# Patient Record
Sex: Female | Born: 1950
Health system: Southern US, Community
[De-identification: ages and names within clinical notes are randomized; demographics above are authoritative.]

## PROBLEM LIST (undated history)

## (undated) DIAGNOSIS — I10 Essential (primary) hypertension: Secondary | ICD-10-CM

## (undated) DIAGNOSIS — M1990 Unspecified osteoarthritis, unspecified site: Secondary | ICD-10-CM

## (undated) HISTORY — DX: Unspecified osteoarthritis, unspecified site: M19.90

## (undated) HISTORY — PX: CHOLECYSTECTOMY: SHX55

## (undated) HISTORY — DX: Essential (primary) hypertension: I10

---

## 1979-05-10 HISTORY — PX: MANDIBLE SURGERY: SHX707

## 1999-05-10 HISTORY — PX: SHOULDER ARTHROSCOPY: SHX128

## 2000-05-15 ENCOUNTER — Emergency Department (HOSPITAL_COMMUNITY): Admission: EM | Admit: 2000-05-15 | Discharge: 2000-05-15 | Payer: Self-pay | Admitting: Emergency Medicine

## 2001-08-15 ENCOUNTER — Encounter (HOSPITAL_BASED_OUTPATIENT_CLINIC_OR_DEPARTMENT_OTHER): Payer: Self-pay | Admitting: General Surgery

## 2001-08-15 ENCOUNTER — Ambulatory Visit (HOSPITAL_COMMUNITY): Admission: RE | Admit: 2001-08-15 | Discharge: 2001-08-15 | Payer: Self-pay | Admitting: General Surgery

## 2001-08-17 ENCOUNTER — Ambulatory Visit (HOSPITAL_COMMUNITY): Admission: RE | Admit: 2001-08-17 | Discharge: 2001-08-17 | Payer: Self-pay | Admitting: General Surgery

## 2001-08-28 ENCOUNTER — Ambulatory Visit (HOSPITAL_COMMUNITY): Admission: RE | Admit: 2001-08-28 | Discharge: 2001-08-28 | Payer: Self-pay | Admitting: General Surgery

## 2001-08-28 ENCOUNTER — Encounter (HOSPITAL_BASED_OUTPATIENT_CLINIC_OR_DEPARTMENT_OTHER): Payer: Self-pay | Admitting: General Surgery

## 2004-12-20 ENCOUNTER — Emergency Department (HOSPITAL_COMMUNITY): Admission: EM | Admit: 2004-12-20 | Discharge: 2004-12-20 | Payer: Self-pay | Admitting: Emergency Medicine

## 2007-09-12 ENCOUNTER — Emergency Department (HOSPITAL_COMMUNITY): Admission: EM | Admit: 2007-09-12 | Discharge: 2007-09-12 | Payer: Self-pay | Admitting: Family Medicine

## 2012-02-07 ENCOUNTER — Emergency Department (HOSPITAL_COMMUNITY)
Admission: EM | Admit: 2012-02-07 | Discharge: 2012-02-07 | Disposition: A | Discharge status: Home / Self Care | Payer: Managed Care, Other (non HMO) | Source: Home / Self Care | Attending: Emergency Medicine | Admitting: Emergency Medicine

## 2012-02-07 ENCOUNTER — Encounter (HOSPITAL_COMMUNITY): Payer: Self-pay | Admitting: Emergency Medicine

## 2012-02-07 DIAGNOSIS — R030 Elevated blood-pressure reading, without diagnosis of hypertension: Secondary | ICD-10-CM

## 2012-02-07 LAB — POCT URINALYSIS DIP (DEVICE)
Glucose, UA: NEGATIVE mg/dL
Ketones, ur: 40 mg/dL — AB
Leukocytes, UA: NEGATIVE
Nitrite: NEGATIVE
Protein, ur: NEGATIVE mg/dL
Specific Gravity, Urine: 1.025 (ref 1.005–1.030)
Urobilinogen, UA: 0.2 mg/dL (ref 0.0–1.0)
pH: 5.5 (ref 5.0–8.0)

## 2012-02-07 NOTE — ED Notes (Signed)
Pt here with concerns of poss diabetes after last week episode of tingling in fingers and polyuria,increased thirst,tired,and slight h/a.Denies hx diabetes or HTN. BP 185/98

## 2012-02-07 NOTE — ED Provider Notes (Signed)
History     CSN: [id number]  Arrival date & time 02/07/12  7829   None     Chief Complaint  Patient presents with  . Diabetes    (Consider location/radiation/quality/duration/timing/severity/associated sxs/prior treatment) The history is provided by the patient.  Pt reports she drank sugary drink one week ago and shortly thereafter she began to have urinary frequency that lasted for a couple of days.  States same occurs when she eat sweet food.  Has been avoiding those food and symptoms have improved.  Additionally complains of generalized fatigue, headache, blurred vision and intermittent paresthesias in left hand.  History reviewed. No pertinent past medical history.  History reviewed. No pertinent past surgical history.  No family history on file.  History  Substance Use Topics  . Smoking status: Former Games developer  . Smokeless tobacco: Not on file  . Alcohol Use: No    OB History    Grav Para Term Preterm Abortions TAB SAB Ect Mult Living                  Review of Systems  Constitutional: Negative.   Respiratory: Negative.   Cardiovascular: Negative.   Genitourinary: Positive for frequency.  Neurological: Positive for dizziness and headaches. Negative for tremors, seizures, syncope, facial asymmetry, speech difficulty, weakness and numbness.    Allergies  Review of patient's allergies indicates no known allergies.  Home Medications  No current outpatient prescriptions on file.  BP 185/98  Pulse 80  Temp 98.1 F (36.7 C) (Oral)  Resp 16  SpO2 95%  Physical Exam  Nursing note and vitals reviewed. Constitutional: She is oriented to person, place, and time. Vital signs are normal. She appears well-developed and well-nourished. She is active and cooperative.  HENT:  Head: Normocephalic.  Right Ear: Hearing, tympanic membrane, external ear and ear canal normal.  Left Ear: Hearing, tympanic membrane, external ear and ear canal normal.  Nose: Nose normal.    Mouth/Throat: Uvula is midline, oropharynx is clear and moist and mucous membranes are normal.  Eyes: Conjunctivae normal are normal. Pupils are equal, round, and reactive to light. No scleral icterus. Right eye exhibits abnormal extraocular motion.  Neck: Trachea normal, normal range of motion and full passive range of motion without pain. Neck supple. No spinous process tenderness and no muscular tenderness present.  Cardiovascular: Normal rate, regular rhythm, normal heart sounds, intact distal pulses and normal pulses.   No murmur heard. Pulmonary/Chest: Effort normal and breath sounds normal.  Abdominal: Soft. Normal appearance and bowel sounds are normal. There is no tenderness. There is no rebound and no CVA tenderness.  Musculoskeletal: Normal range of motion.  Lymphadenopathy:       Head (right side): No submental, no submandibular, no tonsillar, no preauricular, no posterior auricular and no occipital adenopathy present.       Head (left side): No submental, no submandibular, no tonsillar, no preauricular, no posterior auricular and no occipital adenopathy present.    She has no cervical adenopathy.  Neurological: She is alert and oriented to person, place, and time. She has normal strength and normal reflexes. No cranial nerve deficit or sensory deficit. Coordination and gait normal. GCS eye subscore is 4. GCS verbal subscore is 5. GCS motor subscore is 6.       MAE x 4, distally sensation intact  Skin: Skin is warm and dry.  Psychiatric: She has a normal mood and affect. Her speech is normal and behavior is normal. Judgment and thought content normal.  Cognition and memory are normal.    ED Course  Procedures (including critical care time)  Labs Reviewed  POCT URINALYSIS DIP (DEVICE) - Abnormal; Notable for the following:    Bilirubin Urine SMALL (*)     Ketones, ur 40 (*)     Hgb urine dipstick SMALL (*)     All other components within normal limits  LAB REPORT - SCANNED    No results found.   1. Elevated BP       MDM  Establish primary care provider, resources provided.  I am not able to diagnose and manage diabetes in this office setting.         Johnsie Kindred, NP 02/08/12 1623

## 2012-02-08 ENCOUNTER — Emergency Department (HOSPITAL_COMMUNITY)
Admission: EM | Admit: 2012-02-08 | Discharge: 2012-02-08 | Disposition: A | Discharge status: Home / Self Care | Payer: Managed Care, Other (non HMO) | Attending: Emergency Medicine | Admitting: Emergency Medicine

## 2012-02-08 ENCOUNTER — Encounter (HOSPITAL_COMMUNITY): Payer: Self-pay | Admitting: *Deleted

## 2012-02-08 DIAGNOSIS — I10 Essential (primary) hypertension: Secondary | ICD-10-CM

## 2012-02-08 DIAGNOSIS — Z87891 Personal history of nicotine dependence: Secondary | ICD-10-CM | POA: Insufficient documentation

## 2012-02-08 LAB — CBC WITH DIFFERENTIAL/PLATELET
Lymphocytes Relative: 30 % (ref 12–46)
Lymphs Abs: 1.7 10*3/uL (ref 0.7–4.0)
MCV: 84.6 fL (ref 78.0–100.0)
Neutro Abs: 3.3 10*3/uL (ref 1.7–7.7)
Neutrophils Relative %: 55 % (ref 43–77)
Platelets: 180 10*3/uL (ref 150–400)
RBC: 4.56 MIL/uL (ref 3.87–5.11)
WBC: 5.9 10*3/uL (ref 4.0–10.5)

## 2012-02-08 LAB — URINALYSIS, ROUTINE W REFLEX MICROSCOPIC
Glucose, UA: NEGATIVE mg/dL
Leukocytes, UA: NEGATIVE
Protein, ur: NEGATIVE mg/dL

## 2012-02-08 LAB — BASIC METABOLIC PANEL
CO2: 26 mEq/L (ref 19–32)
Calcium: 9.8 mg/dL (ref 8.4–10.5)
GFR calc Af Amer: >90 mL/min (ref >90)
GFR calc non Af Amer: >90 mL/min (ref >90)
Sodium: 138 mEq/L (ref 135–145)

## 2012-02-08 MED ORDER — HYDROCHLOROTHIAZIDE 25 MG PO TABS: 25.0000 mg | ORAL_TABLET | Freq: Every day | ORAL | Status: DC

## 2012-02-08 NOTE — ED Provider Notes (Cosign Needed)
History     CSN: [id number]  Arrival date & time 02/08/12  1758   First MD Initiated Contact with Patient 02/08/12 1951      Chief Complaint  Patient presents with  . Dizziness    (Consider location/radiation/quality/duration/timing/severity/associated sxs/prior treatment) HPI Comments: Pt is a 61 year old woman who had headache and blurred vision a month ago.  Then 2 weeks ago she drank a sugary drink and had urinated a lot.  She also had numbness in fingers of her left hand, which has subsided.  [facility], and was advised to come to the ED to be checked for hypertension.  She had to work today, and so came in after work.  There was also some thought that she could have diabetes.  Patient is a 61 y.o. female presenting with weakness. The history is provided by the patient and medical records. No language interpreter was used.  Weakness Primary symptoms do not include fever. The symptoms began more than 1 week ago. The symptoms are waxing and waning. The neurological symptoms are diffuse.  Additional symptoms include weakness. Associated medical issues comments: Family history of hypertension.Marland Kitchen    History reviewed. No pertinent past medical history.  History reviewed. No pertinent past surgical history.  No family history on file.  History  Substance Use Topics  . Smoking status: Former Games developer  . Smokeless tobacco: Not on file  . Alcohol Use: No    OB History    Grav Para Term Preterm Abortions TAB SAB Ect Mult Living                  Review of Systems  Constitutional: Negative for fever and chills.  HENT: Negative.   Eyes: Negative.   Respiratory: Negative.   Cardiovascular: Negative.   Gastrointestinal: Negative.   Genitourinary: Negative.   Musculoskeletal: Negative.   Neurological: Positive for weakness.  Psychiatric/Behavioral: Negative.     Allergies  Review of patient's allergies indicates no known allergies.  Home  Medications   Current Outpatient Rx  Name Route Sig Dispense Refill  . ASPIRIN 325 MG PO TABS Oral Take 325 mg by mouth once.    Marlin Canary HEADACHE PO Oral Take 1 tablet by mouth daily as needed. For headache    . IBUPROFEN 200 MG PO TABS Oral Take 400 mg by mouth every 6 (six) hours as needed. For pain    . NAPROXEN SODIUM 220 MG PO TABS Oral Take 220 mg by mouth 2 (two) times daily with a meal.      BP 159/79  Pulse 75  Temp 98.6 F (37 C) (Oral)  Resp 18  SpO2 100%  Physical Exam  Vitals reviewed. Constitutional: She is oriented to person, place, and time. She appears well-developed and well-nourished. No distress.  HENT:  Head: Normocephalic and atraumatic.  Right Ear: External ear normal.  Left Ear: External ear normal.  Mouth/Throat: Oropharynx is clear and moist.  Eyes: Conjunctivae normal and EOM are normal. Pupils are equal, round, and reactive to light.  Neck: Normal range of motion. Neck supple.  Cardiovascular: Normal rate, regular rhythm and normal heart sounds.   Pulmonary/Chest: Breath sounds normal.  Abdominal: Soft. Bowel sounds are normal.  Musculoskeletal: Normal range of motion. She exhibits no edema and no tenderness.  Neurological: She is alert and oriented to person, place, and time.       No sensory or motor deficit.  Skin: Skin is warm and dry.  Psychiatric: She has a normal mood and affect. Her behavior is normal.    ED Course  Procedures (including critical care time)   Labs Reviewed  URINALYSIS, ROUTINE W REFLEX MICROSCOPIC  BASIC METABOLIC PANEL  CBC WITH DIFFERENTIAL   8:19 PM Pt seen --> physical exam performed.  Lab workup was negative.  Plan to treat for hypertension with HCTZ 25 mg qd.  She will need to establish herself with a primary care doctor for followup.   1. Hypertension            Carleene Cooper III, MD 02/09/12 [phone]

## 2012-02-08 NOTE — ED Notes (Signed)
Pt was seen at urgent care yesterday and reports dizzy and blurriness with increased urination for about one month.  Numbness to left tip of fingers for one month.  Pt states dizzy spell yesterday, but states been off balance for over one month

## 2012-02-08 NOTE — ED Provider Notes (Deleted)
7:47 PM  Date: 02/08/2012  Rate: 77  Rhythm: normal sinus rhythm and sinus arrhythmia  QRS Axis: normal  Intervals: normal  ST/T Wave abnormalities: nonspecific T wave changes  Conduction Disutrbances:none  Narrative Interpretation: Abnormal EKG  Old EKG Reviewed: none available    Carleene Cooper III, MD 02/08/12 1948

## 2012-02-09 NOTE — ED Provider Notes (Signed)
Medical screening examination/treatment/procedure(s) were performed by non-physician practitioner and as supervising physician I was immediately available for consultation/collaboration.  Leslee Home, M.D.   Reuben Likes, MD 02/09/12 1058

## 2013-07-29 ENCOUNTER — Emergency Department (HOSPITAL_COMMUNITY)
Admission: EM | Admit: 2013-07-29 | Discharge: 2013-07-29 | Disposition: A | Discharge status: Home / Self Care | Payer: Managed Care, Other (non HMO) | Attending: Emergency Medicine | Admitting: Emergency Medicine

## 2013-07-29 ENCOUNTER — Emergency Department (HOSPITAL_COMMUNITY): Payer: Managed Care, Other (non HMO)

## 2013-07-29 ENCOUNTER — Encounter (HOSPITAL_COMMUNITY): Payer: Self-pay | Admitting: Emergency Medicine

## 2013-07-29 DIAGNOSIS — IMO0001 Reserved for inherently not codable concepts without codable children: Secondary | ICD-10-CM | POA: Insufficient documentation

## 2013-07-29 DIAGNOSIS — M25579 Pain in unspecified ankle and joints of unspecified foot: Secondary | ICD-10-CM | POA: Insufficient documentation

## 2013-07-29 DIAGNOSIS — Z79899 Other long term (current) drug therapy: Secondary | ICD-10-CM | POA: Insufficient documentation

## 2013-07-29 DIAGNOSIS — M79672 Pain in left foot: Secondary | ICD-10-CM

## 2013-07-29 DIAGNOSIS — I10 Essential (primary) hypertension: Secondary | ICD-10-CM | POA: Insufficient documentation

## 2013-07-29 DIAGNOSIS — M79671 Pain in right foot: Secondary | ICD-10-CM

## 2013-07-29 DIAGNOSIS — R42 Dizziness and giddiness: Secondary | ICD-10-CM | POA: Insufficient documentation

## 2013-07-29 DIAGNOSIS — M79609 Pain in unspecified limb: Secondary | ICD-10-CM

## 2013-07-29 DIAGNOSIS — Z87891 Personal history of nicotine dependence: Secondary | ICD-10-CM | POA: Insufficient documentation

## 2013-07-29 DIAGNOSIS — Z791 Long term (current) use of non-steroidal anti-inflammatories (NSAID): Secondary | ICD-10-CM | POA: Insufficient documentation

## 2013-07-29 LAB — CBC WITH DIFFERENTIAL/PLATELET
BASOS ABS: 0.1 10*3/uL (ref 0.0–0.1)
BASOS PCT: 1 % (ref 0–1)
EOS ABS: 0.3 10*3/uL (ref 0.0–0.7)
Eosinophils Relative: 5 % (ref 0–5)
HCT: 40.9 % (ref 36.0–46.0)
Hemoglobin: 12.8 g/dL (ref 12.0–15.0)
Lymphocytes Relative: 33 % (ref 12–46)
Lymphs Abs: 1.8 10*3/uL (ref 0.7–4.0)
MCH: 27.4 pg (ref 26.0–34.0)
MCHC: 31.3 g/dL (ref 30.0–36.0)
MCV: 87.4 fL (ref 78.0–100.0)
MONOS PCT: 9 % (ref 3–12)
Monocytes Absolute: 0.5 10*3/uL (ref 0.1–1.0)
NEUTROS ABS: 2.8 10*3/uL (ref 1.7–7.7)
NEUTROS PCT: 52 % (ref 43–77)
PLATELETS: 160 10*3/uL (ref 150–400)
RBC: 4.68 MIL/uL (ref 3.87–5.11)
RDW: 14.2 % (ref 11.5–15.5)
WBC: 5.3 10*3/uL (ref 4.0–10.5)

## 2013-07-29 LAB — COMPREHENSIVE METABOLIC PANEL
ALBUMIN: 3.8 g/dL (ref 3.5–5.2)
ALK PHOS: 70 U/L (ref 39–117)
ALT: 13 U/L (ref 0–35)
AST: 18 U/L (ref 0–37)
BUN: 10 mg/dL (ref 6–23)
CO2: 27 mEq/L (ref 19–32)
Calcium: 9.4 mg/dL (ref 8.4–10.5)
Chloride: 104 mEq/L (ref 96–112)
Creatinine, Ser: 0.6 mg/dL (ref 0.50–1.10)
GFR calc Af Amer: >90 mL/min (ref >90)
GFR calc non Af Amer: >90 mL/min (ref >90)
Glucose, Bld: 81 mg/dL (ref 70–99)
POTASSIUM: 3.7 meq/L (ref 3.7–5.3)
SODIUM: 143 meq/L (ref 137–147)
TOTAL PROTEIN: 7.9 g/dL (ref 6.0–8.3)
Total Bilirubin: 0.7 mg/dL (ref 0.3–1.2)

## 2013-07-29 LAB — TROPONIN I

## 2013-07-29 MED ORDER — IBUPROFEN 800 MG PO TABS: 800.0000 mg | ORAL_TABLET | Freq: Three times a day (TID) | ORAL | Status: DC

## 2013-07-29 MED ORDER — HYDROCHLOROTHIAZIDE 25 MG PO TABS: 25.0000 mg | ORAL_TABLET | Freq: Every day | ORAL | Status: DC

## 2013-07-29 MED ORDER — IBUPROFEN 800 MG PO TABS
800.0000 mg | ORAL_TABLET | Freq: Once | ORAL | Status: AC
  Administered 2013-07-29: 800 mg via ORAL
  Filled 2013-07-29: qty 1

## 2013-07-29 NOTE — ED Notes (Signed)
Patient transported to X-ray 

## 2013-07-29 NOTE — ED Notes (Signed)
sts after work she is just exhausted also reports htn.

## 2013-07-29 NOTE — Progress Notes (Signed)
VASCULAR LAB PRELIMINARY  PRELIMINARY  PRELIMINARY  PRELIMINARY  Bilateral lower extremity venous duplex  completed.    Preliminary report:  Bilateral:  No evidence of DVT, superficial thrombosis, or Baker's Cyst.    Margaret Staggs, RVT 07/29/2013, 6:44 PM

## 2013-07-29 NOTE — ED Provider Notes (Signed)
CSN: [id number]     Arrival date & time 07/29/13  1435 History   First MD Initiated Contact with Patient 07/29/13 1745     Chief Complaint  Patient presents with  . Foot Pain  . Leg Pain     (Consider location/radiation/quality/duration/timing/severity/associated sxs/prior Treatment) HPI Comments: Patient complains of pain in her bilateral ankles and feet that has been going on for the past 6 months it is worse after she gets off work. 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 She came in today because she has a day off. Sometimes the pain is so bad that she has trouble walking. Denies any focal weakness, numbness or tingling. Lightheadedness. No chest pain or shortness of breath. She is not taking anything for the pain except over-the-counter arthritis relief. She has a history of hypertension states he is not on medication for several years.   The history is provided by the patient.    History reviewed. No pertinent past medical history. History reviewed. No pertinent past surgical history. History reviewed. No pertinent family history. History  Substance Use Topics  . Smoking status: Former Games developer  . Smokeless tobacco: Not on file  . Alcohol Use: No   OB History   Grav Para Term Preterm Abortions TAB SAB Ect Mult Living                 Review of Systems  Constitutional: Negative for fever, activity change and appetite change.  Eyes: Negative for photophobia.  Respiratory: Negative for cough, chest tightness and shortness of breath.   Genitourinary: Negative for dysuria and hematuria.  Musculoskeletal: Positive for arthralgias and myalgias. Negative for back pain.  Neurological: Negative for dizziness, weakness, numbness and headaches.  A complete 10 system review of systems was obtained and all systems are negative except as noted in the HPI and PMH.      Allergies  Review of patient's allergies indicates no known allergies.  Home Medications   Current  Outpatient Rx  Name  Route  Sig  Dispense  Refill  . OVER THE COUNTER MEDICATION   Oral   Take 1 tablet by mouth daily as needed (arthritis relief).         . hydrochlorothiazide (HYDRODIURIL) 25 MG tablet   Oral   Take 1 tablet (25 mg total) by mouth daily.   30 tablet   0   . ibuprofen (ADVIL,MOTRIN) 800 MG tablet   Oral   Take 1 tablet (800 mg total) by mouth 3 (three) times daily.   21 tablet   0    BP 184/68  Pulse 63  Temp(Src) 98.6 F (37 C) (Oral)  Resp 22  Ht 5\' 4"  (1.626 m)  Wt 250 lb (113.399 kg)  BMI 42.89 kg/m2  SpO2 99% Physical Exam  Constitutional: She is oriented to person, place, and time. She appears well-developed and well-nourished. No distress.  HENT:  Head: Normocephalic and atraumatic.  Mouth/Throat: Oropharynx is clear and moist. No oropharyngeal exudate.  Eyes: Conjunctivae and EOM are normal. Pupils are equal, round, and reactive to light.  Neck: Normal range of motion. Neck supple.  Cardiovascular: Normal rate, regular rhythm and normal heart sounds.   No murmur heard. Pulmonary/Chest: Effort normal and breath sounds normal. No respiratory distress.  Abdominal: Soft. There is no tenderness. There is no rebound and no guarding.  Musculoskeletal: Normal range of motion. She exhibits tenderness. She exhibits no edema.  5/5 strength in bilateral lower extremities. Ankle plantar  and dorsiflexion intact. Great toe extension intact bilaterally. +2 DP and PT pulses. +2 patellar reflexes bilaterally. Normal gait.  No calf swelling or tenderness.  Neurological: She is alert and oriented to person, place, and time. No cranial nerve deficit. She exhibits normal muscle tone. Coordination normal.  CN 2-12 intact, no ataxia on finger to nose, no nystagmus, 5/5 strength throughout, no pronator drift, Romberg negative, normal gait.   Skin: Skin is warm.    ED Course  Procedures (including critical care time) Labs Review Labs Reviewed  CBC WITH  DIFFERENTIAL  COMPREHENSIVE METABOLIC PANEL  TROPONIN I   Imaging Review Dg Ankle Complete Left  07/29/2013   CLINICAL DATA:  Bilateral ankle pain, no known trauma  EXAM: LEFT ANKLE COMPLETE - 3+ VIEW  COMPARISON:  None.  FINDINGS: Three views of left ankle submitted. No acute fracture or subluxation. There is plantar spurring of calcaneus. Ankle mortise is preserved.  IMPRESSION: No acute fracture or subluxation.  Plantar spurring of calcaneus.   Electronically Signed   By: Natasha MeadLiviu  Pop M.D.   On: 07/29/2013 19:54   Dg Ankle Complete Right  07/29/2013   CLINICAL DATA:  Bilateral ankle pain, no known trauma  EXAM: RIGHT ANKLE - COMPLETE 3+ VIEW  COMPARISON:  None.  FINDINGS: Three views of right ankle submitted. No acute fracture or subluxation. Plantar spurring of calcaneus.  IMPRESSION: No acute fracture or subluxation.  Plantar spurring of calcaneus.   Electronically Signed   By: Natasha MeadLiviu  Pop M.D.   On: 07/29/2013 19:54   Ct Head Wo Contrast  07/29/2013   CLINICAL DATA:  Headache.  EXAM: CT HEAD WITHOUT CONTRAST  TECHNIQUE: Contiguous axial images were obtained from the base of the skull through the vertex without intravenous contrast.  COMPARISON:  None.  FINDINGS: No intracranial abnormalities are identified, including mass lesion or mass effect, hydrocephalus, extra-axial fluid collection, midline shift, hemorrhage, or acute infarction.  The visualized bony calvarium is unremarkable.  IMPRESSION: Unremarkable noncontrast head CT.   Electronically Signed   By: Laveda AbbeJeff  Hu M.D.   On: 07/29/2013 20:31   Dg Knee Complete 4 Views Left  07/29/2013   CLINICAL DATA:  Left knee pain, bilateral ankle pain  EXAM: LEFT KNEE - COMPLETE 4+ VIEW  COMPARISON:  None.  FINDINGS: Four views of the left knee submitted. No acute fracture or subluxation. Mild narrowing of medial joint compartment. Mild spurring of medial tibial plateau and medial femoral condyle. No joint effusion. Mild spurring of patella.  IMPRESSION: No  acute fracture or subluxation. Osteoarthritic changes as described above.   Electronically Signed   By: Natasha MeadLiviu  Pop M.D.   On: 07/29/2013 19:55     EKG Interpretation   Date/Time:  Monday July 29 2013 20:11:47 EDT Ventricular Rate:  66 PR Interval:  144 QRS Duration: 93 QT Interval:  427 QTC Calculation: 447 R Axis:   47 Text Interpretation:  Sinus rhythm No significant change was found  Confirmed by Manus GunningANCOUR  MD, Terran Klinke [phone]([id number]) on 07/29/2013 8:30:47 PM      MDM   Final diagnoses:  Hypertension  Foot pain, bilateral   Several months of bilateral foot pain that is worse after standing for prolonged periods of time. Focal weakness, numbness or tingling. Intact distal pulses.  Blood pressure elevated. History of noncompliance. No headache, chest pain or visual changes.  Dopplers negative for DVTs.   Blood Pressure improved to 176/75. X-rays negative for fractures. Will restart BP meds.  Patient needs PCP followup.  BP 184/68  Pulse 63  Temp(Src) 98.6 F (37 C) (Oral)  Resp 22  Ht 5\' 4"  (1.626 m)  Wt 250 lb (113.399 kg)  BMI 42.89 kg/m2  SpO2 99%   Glynn Octave, MD 07/29/13 2338

## 2013-07-29 NOTE — ED Notes (Signed)
Pt currently in xray

## 2013-07-29 NOTE — Discharge Instructions (Signed)
Arterial Hypertension Take anti-inflammatories as prescribed. Followup with a primary care doctor regarding your elevated blood pressure. Return to the ED if you develop new or worsening symptoms. Arterial hypertension (high blood pressure) is a condition of elevated pressure in your blood vessels. Hypertension over a long period of time is a risk factor for strokes, heart attacks, and heart failure. It is also the leading cause of kidney (renal) failure.  CAUSES   In Adults -- Over 90% of all hypertension has no known cause. This is called essential or primary hypertension. In the other 10% of people with hypertension, the increase in blood pressure is caused by another disorder. This is called secondary hypertension. Important causes of secondary hypertension are:  Heavy alcohol use.  Obstructive sleep apnea.  Hyperaldosterosim (Conn's syndrome).  Steroid use.  Chronic kidney failure.  Hyperparathyroidism.  Medications.  Renal artery stenosis.  Pheochromocytoma.  Cushing's disease.  Coarctation of the aorta.  Scleroderma renal crisis.  Licorice (in excessive amounts).  Drugs (cocaine, methamphetamine). Your caregiver can explain any items above that apply to you.  In Children -- Secondary hypertension is more common and should always be considered.  Pregnancy -- Few women of childbearing age have high blood pressure. However, up to 10% of them develop hypertension of pregnancy. Generally, this will not harm the woman. It may be a sign of 3 complications of pregnancy: preeclampsia, HELLP syndrome, and eclampsia. Follow up and control with medication is necessary. SYMPTOMS   This condition normally does not produce any noticeable symptoms. It is usually found during a routine exam.  Malignant hypertension is a late problem of high blood pressure. It may have the following symptoms:  Headaches.  Blurred vision.  End-organ damage (this means your kidneys, heart, lungs,  and other organs are being damaged).  Stressful situations can increase the blood pressure. If a person with normal blood pressure has their blood pressure go up while being seen by their caregiver, this is often termed "white coat hypertension." Its importance is not known. It may be related with eventually developing hypertension or complications of hypertension.  Hypertension is often confused with mental tension, stress, and anxiety. DIAGNOSIS  The diagnosis is made by 3 separate blood pressure measurements. They are taken at least 1 week apart from each other. If there is organ damage from hypertension, the diagnosis may be made without repeat measurements. Hypertension is usually identified by having blood pressure readings:  Above 140/90 mmHg measured in both arms, at 3 separate times, over a couple weeks.  Over 130/80 mmHg should be considered a risk factor and may require treatment in patients with diabetes. Blood pressure readings over 120/80 mmHg are called "pre-hypertension" even in non-diabetic patients. To get a true blood pressure measurement, use the following guidelines. Be aware of the factors that can alter blood pressure readings.  Take measurements at least 1 hour after caffeine.  Take measurements 30 minutes after smoking and without any stress. This is another reason to quit smoking  it raises your blood pressure.  Use a proper cuff size. Ask your caregiver if you are not sure about your cuff size.  Most home blood pressure cuffs are automatic. They will measure systolic and diastolic pressures. The systolic pressure is the pressure reading at the start of sounds. Diastolic pressure is the pressure at which the sounds disappear. If you are elderly, measure pressures in multiple postures. Try sitting, lying or standing.  Sit at rest for a minimum of 5 minutes before taking  measurements.  You should not be on any medications like decongestants. These are found in many  cold medications.  Record your blood pressure readings and review them with your caregiver. If you have hypertension:  Your caregiver may do tests to be sure you do not have secondary hypertension (see "causes" above).  Your caregiver may also look for signs of metabolic syndrome. This is also called Syndrome X or Insulin Resistance Syndrome. You may have this syndrome if you have type 2 diabetes, abdominal obesity, and abnormal blood lipids in addition to hypertension.  Your caregiver will take your medical and family history and perform a physical exam.  Diagnostic tests may include blood tests (for glucose, cholesterol, potassium, and kidney function), a urinalysis, or an EKG. Other tests may also be necessary depending on your condition. PREVENTION  There are important lifestyle issues that you can adopt to reduce your chance of developing hypertension:  Maintain a normal weight.  Limit the amount of salt (sodium) in your diet.  Exercise often.  Limit alcohol intake.  Get enough potassium in your diet. Discuss specific advice with your caregiver.  Follow a DASH diet (dietary approaches to stop hypertension). This diet is rich in fruits, vegetables, and low-fat dairy products, and avoids certain fats. PROGNOSIS  Essential hypertension cannot be cured. Lifestyle changes and medical treatment can lower blood pressure and reduce complications. The prognosis of secondary hypertension depends on the underlying cause. Many people whose hypertension is controlled with medicine or lifestyle changes can live a normal, healthy life.  RISKS AND COMPLICATIONS  While high blood pressure alone is not an illness, it often requires treatment due to its short- and long-term effects on many organs. Hypertension increases your risk for:  CVAs or strokes (cerebrovascular accident).  Heart failure due to chronically high blood pressure (hypertensive cardiomyopathy).  Heart attack (myocardial  infarction).  Damage to the retina (hypertensive retinopathy).  Kidney failure (hypertensive nephropathy). Your caregiver can explain list items above that apply to you. Treatment of hypertension can significantly reduce the risk of complications. TREATMENT   For overweight patients, weight loss and regular exercise are recommended. Physical fitness lowers blood pressure.  Mild hypertension is usually treated with diet and exercise. A diet rich in fruits and vegetables, fat-free dairy products, and foods low in fat and salt (sodium) can help lower blood pressure. Decreasing salt intake decreases blood pressure in a 1/3 of people.  Stop smoking if you are a smoker. The steps above are highly effective in reducing blood pressure. While these actions are easy to suggest, they are difficult to achieve. Most patients with moderate or severe hypertension end up requiring medications to bring their blood pressure down to a normal level. There are several classes of medications for treatment. Blood pressure pills (antihypertensives) will lower blood pressure by their different actions. Lowering the blood pressure by 10 mmHg may decrease the risk of complications by as much as 25%. The goal of treatment is effective blood pressure control. This will reduce your risk for complications. Your caregiver will help you determine the best treatment for you according to your lifestyle. What is excellent treatment for one person, may not be for you. HOME CARE INSTRUCTIONS   Do not smoke.  Follow the lifestyle changes outlined in the "Prevention" section.  If you are on medications, follow the directions carefully. Blood pressure medications must be taken as prescribed. Skipping doses reduces their benefit. It also puts you at risk for problems.  Follow up with your  caregiver, as directed.  If you are asked to monitor your blood pressure at home, follow the guidelines in the "Diagnosis" section above. SEEK  MEDICAL CARE IF:   You think you are having medication side effects.  You have recurrent headaches or lightheadedness.  You have swelling in your ankles.  You have trouble with your vision. SEEK IMMEDIATE MEDICAL CARE IF:   You have sudden onset of chest pain or pressure, difficulty breathing, or other symptoms of a heart attack.  You have a severe headache.  You have symptoms of a stroke (such as sudden weakness, difficulty speaking, difficulty walking). MAKE SURE YOU:   Understand these instructions.  Will watch your condition.  Will get help right away if you are not doing well or get worse. Document Released: 04/25/2005 Document Revised: 07/18/2011 Document Reviewed: 11/23/2006 [facility] Patient Information 2014 [place].

## 2013-07-29 NOTE — ED Notes (Signed)
Pt complains of pain in bilateral feet across the top of each foot. Affecting legs and gait sts she walks almost side ways due to the pain.

## 2013-09-09 ENCOUNTER — Ambulatory Visit: Payer: Managed Care, Other (non HMO) | Attending: Internal Medicine | Admitting: Internal Medicine

## 2013-09-09 ENCOUNTER — Encounter: Payer: Self-pay | Admitting: Internal Medicine

## 2013-09-09 VITALS — BP 137/74 | HR 76 | Temp 98.8°F | Resp 14 | Ht 64.0 in | Wt 247.8 lb

## 2013-09-09 DIAGNOSIS — Z79899 Other long term (current) drug therapy: Secondary | ICD-10-CM | POA: Insufficient documentation

## 2013-09-09 DIAGNOSIS — Z87891 Personal history of nicotine dependence: Secondary | ICD-10-CM | POA: Insufficient documentation

## 2013-09-09 DIAGNOSIS — Z7689 Persons encountering health services in other specified circumstances: Secondary | ICD-10-CM

## 2013-09-09 DIAGNOSIS — J309 Allergic rhinitis, unspecified: Secondary | ICD-10-CM | POA: Insufficient documentation

## 2013-09-09 DIAGNOSIS — IMO0002 Reserved for concepts with insufficient information to code with codable children: Secondary | ICD-10-CM

## 2013-09-09 DIAGNOSIS — I10 Essential (primary) hypertension: Secondary | ICD-10-CM | POA: Insufficient documentation

## 2013-09-09 DIAGNOSIS — Z7189 Other specified counseling: Secondary | ICD-10-CM

## 2013-09-09 DIAGNOSIS — N393 Stress incontinence (female) (male): Secondary | ICD-10-CM

## 2013-09-09 DIAGNOSIS — M1710 Unilateral primary osteoarthritis, unspecified knee: Secondary | ICD-10-CM

## 2013-09-09 MED ORDER — DICLOFENAC SODIUM 1 % TD GEL: 2.0000 g | Freq: Four times a day (QID) | TRANSDERMAL | Status: DC

## 2013-09-09 MED ORDER — LORATADINE 10 MG PO TABS: 10.0000 mg | ORAL_TABLET | Freq: Every day | ORAL | Status: DC

## 2013-09-09 MED ORDER — AMLODIPINE BESYLATE 10 MG PO TABS: 10.0000 mg | ORAL_TABLET | Freq: Every day | ORAL | Status: DC

## 2013-09-09 NOTE — Patient Instructions (Signed)
Exercise to Lose Weight Exercise and a healthy diet may help you lose weight. Your doctor may suggest specific exercises. EXERCISE IDEAS AND TIPS  Choose low-cost things you enjoy doing, such as walking, bicycling, or exercising to workout videos.  Take stairs instead of the elevator.  Walk during your lunch break.  Park your car further away from work or school.  Go to a gym or an exercise class.  Start with 5 to 10 minutes of exercise each day. Build up to 30 minutes of exercise 4 to 6 days a week.  Wear shoes with good support and comfortable clothes.  Stretch before and after working out.  Work out until you breathe harder and your heart beats faster.  Drink extra water when you exercise.  Do not do so much that you hurt yourself, feel dizzy, or get very short of breath. Exercises that burn about 150 calories:  Running 1  miles in 15 minutes.  Playing volleyball for 45 to 60 minutes.  Washing and waxing a car for 45 to 60 minutes.  Playing touch football for 45 minutes.  Walking 1  miles in 35 minutes.  Pushing a stroller 1  miles in 30 minutes.  Playing basketball for 30 minutes.  Raking leaves for 30 minutes.  Bicycling 5 miles in 30 minutes.  Walking 2 miles in 30 minutes.  Dancing for 30 minutes.  Shoveling snow for 15 minutes.  Swimming laps for 20 minutes.  Walking up stairs for 15 minutes.  Bicycling 4 miles in 15 minutes.  Gardening for 30 to 45 minutes.  Jumping rope for 15 minutes.  Washing windows or floors for 45 to 60 minutes. Document Released: 05/28/2010 Document Revised: 07/18/2011 Document Reviewed: 05/28/2010 [employer] Patient Information 2014 [employer] DASH Diet The DASH diet stands for "Dietary Approaches to Stop Hypertension." It is a healthy eating plan that has been shown to reduce high blood pressure (hypertension) in as little as 14 days, while also possibly providing other significant health benefits. These other  health benefits include reducing the risk of breast cancer after menopause and reducing the risk of type 2 diabetes, heart disease, colon cancer, and stroke. Health benefits also include weight loss and slowing kidney failure in patients with chronic kidney disease.  DIET GUIDELINES  Limit salt (sodium). Your diet should contain less than 1500 mg of sodium daily.  Limit refined or processed carbohydrates. Your diet should include mostly whole grains. Desserts and added sugars should be used sparingly.  Include small amounts of heart-healthy fats. These types of fats include nuts, oils, and tub margarine. Limit saturated and trans fats. These fats have been shown to be harmful in the body. CHOOSING FOODS  The following food groups are based on a 2000 calorie diet. See your Registered Dietitian for individual calorie needs. Grains and Grain Products (6 to 8 servings daily)  Eat More Often: Whole-wheat bread, brown rice, whole-grain or wheat pasta, quinoa, popcorn without added fat or salt (air popped).  Eat Less Often: White bread, white pasta, white rice, cornbread. Vegetables (4 to 5 servings daily)  Eat More Often: Fresh, frozen, and canned vegetables. Vegetables may be raw, steamed, roasted, or grilled with a minimal amount of fat.  Eat Less Often/Avoid: Creamed or fried vegetables. Vegetables in a cheese sauce. Fruit (4 to 5 servings daily)  Eat More Often: All fresh, canned (in natural juice), or frozen fruits. Dried fruits without added sugar. One hundred percent fruit juice ( cup [237 mL] daily).    Eat Less Often: Dried fruits with added sugar. Canned fruit in light or heavy syrup. Lean Meats, Fish, and Poultry (2 servings or less daily. One serving is 3 to 4 oz [85-114 g]).  Eat More Often: Ninety percent or leaner ground beef, tenderloin, sirloin. Round cuts of beef, chicken breast, turkey breast. All fish. Grill, bake, or broil your meat. Nothing should be fried.  Eat Less  Often/Avoid: Fatty cuts of meat, turkey, or chicken leg, thigh, or wing. Fried cuts of meat or fish. Dairy (2 to 3 servings)  Eat More Often: Low-fat or fat-free milk, low-fat plain or light yogurt, reduced-fat or part-skim cheese.  Eat Less Often/Avoid: Milk (whole, 2%).Whole milk yogurt. Full-fat cheeses. Nuts, Seeds, and Legumes (4 to 5 servings per week)  Eat More Often: All without added salt.  Eat Less Often/Avoid: Salted nuts and seeds, canned beans with added salt. Fats and Sweets (limited)  Eat More Often: Vegetable oils, tub margarines without trans fats, sugar-free gelatin. Mayonnaise and salad dressings.  Eat Less Often/Avoid: Coconut oils, palm oils, butter, stick margarine, cream, half and half, cookies, candy, pie. FOR MORE INFORMATION The Dash Diet Eating Plan: www.dashdiet.org Document Released: 04/14/2011 Document Revised: 07/18/2011 Document Reviewed: 04/14/2011 [employer] Patient Information 2014 [employer].  

## 2013-09-09 NOTE — Progress Notes (Signed)
Patient ID: Stephanie Juarez, female   DOB: 07/06/1950, 63 y.o.   MRN: [id number]    [id number]  [id number]  DOB - 12/09/1950  CC:  Chief Complaint  Patient presents with  . Establish Care  . Hypertension  . Osteoarthritis       HPI: Stephanie Juarez is a 63 y.o. female with a past medical history of hypertension and arthritis here today to establish care. Patient reports that she was on hydrochlorothiazide 25 mg daily for the past year and has been having some stress incontinence. Patient reports leakage of the urine with heavy lifting or sneezing which has been going on since giving birth to two 10 pound babies multiple years ago. Patient reports that she recently lost her job due to memory loss. She reports that she often forgets why she walked in the room and the previous task she was working on. Patient states she was not as organized at work and was forgetting to take care of important task which lead to her being released.   Patient has No headache, No chest pain, No abdominal pain - No Nausea, No new weakness tingling or numbness, No Cough - SOB.  No Known Allergies Past Medical History  Diagnosis Date  . Hypertension   . Arthritis    Current Outpatient Prescriptions on File Prior to Visit  Medication Sig Dispense Refill  . hydrochlorothiazide (HYDRODIURIL) 25 MG tablet Take 1 tablet (25 mg total) by mouth daily.  30 tablet  0  . ibuprofen (ADVIL,MOTRIN) 800 MG tablet Take 1 tablet (800 mg total) by mouth 3 (three) times daily.  21 tablet  0  . OVER THE COUNTER MEDICATION Take 1 tablet by mouth daily as needed (arthritis relief).       No current facility-administered medications on file prior to visit.   Family History  Problem Relation Age of Onset  . Arthritis Mother   . Heart disease Mother   . Heart disease Father    History   Social History  . Marital Status: Single    Spouse Name: N/A    Number of Children: N/A  . Years of Education: N/A   Occupational  History  . Not on file.   Social History Main Topics  . Smoking status: Former Games developermoker  . Smokeless tobacco: Not on file  . Alcohol Use: No  . Drug Use: No  . Sexual Activity:    Other Topics Concern  . Not on file   Social History Narrative  . No narrative on file    Review of Systems: Constitutional: Negative for fever, chills, diaphoresis, activity change, appetite change. Positive for fatigue. HENT: Negative for ear pain, nosebleeds, congestion, facial swelling, rhinorrhea, neck pain, neck stiffness and ear discharge.  Eyes: Negative for pain and visual disturbance. Positive for clear discharge, redness, itching . Respiratory: Negative for cough, choking, chest tightness, shortness of breath, wheezing and stridor.  Cardiovascular: Negative for chest pain, palpitations and leg swelling. Gastrointestinal: Negative for abdominal distention. Genitourinary: Negative for dysuria, urgency, frequency, hematuria, flank pain, decreased urine volume, difficulty urinating and dyspareunia. positive for ncontinence-when lifting, sneezing Musculoskeletal: Negative for back pain, joint swelling, arthralgia and gait problem. Neurological: Negative for dizziness, tremors, seizures, syncope, facial asymmetry, speech difficulty, weakness, light-headedness, numbness. Positive for headaches and memory loss for past year Hematological: Negative for adenopathy. Does not bruise/bleed easily Psychiatric/Behavioral: Negative for hallucinations, behavioral problems, confusion, dysphoric mood, decreased concentration and agitation.    Objective:   Filed Vitals:   09/09/13  1012  BP: 137/74  Pulse: 76  Temp: 98.8 F (37.1 C)  Resp: 14    Physical Exam: Constitutional: Patient appears well-developed and well-nourished. No distress. HENT: Normocephalic, atraumatic, External right and left ear normal. Oropharynx is clear and moist.  Eyes: Red conjunctivae. EOM are normal. PERRLA, no scleral icterus.  Clear discharge from bilateral eyes. Neck: Normal ROM. Neck supple. No JVD. No tracheal deviation. No thyromegaly. CVS: RRR, S1/S2 +, no murmurs, no gallops, no carotid bruit.  Pulmonary: Effort and breath sounds normal, no stridor, rhonchi, wheezes, rales.  Abdominal: Soft. BS +, no distension, tenderness, rebound or guarding.  Musculoskeletal: Normal range of motion. No edema and no tenderness.  Lymphadenopathy: No lymphadenopathy noted, cervical Neuro: Alert. Normal reflexes, muscle tone coordination. No cranial nerve deficit. Skin: Skin is warm and dry. No rash noted. Not diaphoretic. No erythema. No pallor. Psychiatric: Normal mood and affect. Behavior, judgment, thought content normal.  Lab Results  Component Value Date   WBC 5.3 07/29/2013   HGB 12.8 07/29/2013   HCT 40.9 07/29/2013   MCV 87.4 07/29/2013   PLT 160 07/29/2013   Lab Results  Component Value Date   CREATININE 0.60 07/29/2013   BUN 10 07/29/2013   NA 143 07/29/2013   K 3.7 07/29/2013   CL 104 07/29/2013   CO2 27 07/29/2013    No results found for this basename: HGBA1C   Lipid Panel  No results found for this basename: chol, trig, hdl, cholhdl, vldl, ldlcalc       Assessment and plan:   Stephanie Juarez was seen today for establish care, hypertension and osteoarthritis.  Diagnoses and associated orders for this visit:  Stress incontinence - Ambulatory referral to Urology Discontinue hydrochlorothiazide 25 mg daily  Allergic rhinitis - loratadine (CLARITIN) 10 MG tablet; Take 1 tablet (10 mg total) by mouth daily.  Encounter to establish care - TSH; Future - CBC; Future - COMPLETE METABOLIC PANEL WITH GFR; Future - Lipid panel; Future  HTN (hypertenion) Switch to amLODipine (NORVASC) 10 MG tablet; Take 1 tablet (10 mg total) by mouth daily.  Osteoarthrosis, unspecified whether generalized or localized, lower leg - diclofenac sodium (VOLTAREN) 1 % GEL; Apply 2 g topically 4 (four) times daily.   Return in  about 3 months (around 12/10/2013) for Hypertension, 2 weeks-Nurse Visit BP check, Lab Visit.   Holland CommonsValerie Keck, NP-C [facility] [phone] 09/09/2013, 12:56 PM

## 2013-09-09 NOTE — Progress Notes (Signed)
Patient here to establish care  States she was seen in ED for Headaches and memory loss due to HTN C/O left leg pain times 2 years Started in left foot and traveling to behind left side of left knee. States she recently lost job 08/01/13 for memory difficulties. PHQ-9 score of 18. Provider aware.

## 2013-09-23 ENCOUNTER — Other Ambulatory Visit: Payer: Managed Care, Other (non HMO)

## 2013-12-11 ENCOUNTER — Ambulatory Visit: Payer: Managed Care, Other (non HMO) | Admitting: Internal Medicine

## 2014-06-26 ENCOUNTER — Ambulatory Visit: Payer: Self-pay | Attending: Internal Medicine | Admitting: Internal Medicine

## 2014-06-26 ENCOUNTER — Encounter: Payer: Self-pay | Admitting: Internal Medicine

## 2014-06-26 VITALS — BP 160/81 | HR 92 | Temp 98.5°F | Resp 18 | Ht 65.0 in | Wt 260.0 lb

## 2014-06-26 DIAGNOSIS — F329 Major depressive disorder, single episode, unspecified: Secondary | ICD-10-CM | POA: Insufficient documentation

## 2014-06-26 DIAGNOSIS — I10 Essential (primary) hypertension: Secondary | ICD-10-CM | POA: Insufficient documentation

## 2014-06-26 DIAGNOSIS — R413 Other amnesia: Secondary | ICD-10-CM | POA: Insufficient documentation

## 2014-06-26 DIAGNOSIS — M2550 Pain in unspecified joint: Secondary | ICD-10-CM | POA: Insufficient documentation

## 2014-06-26 DIAGNOSIS — Z23 Encounter for immunization: Secondary | ICD-10-CM | POA: Insufficient documentation

## 2014-06-26 DIAGNOSIS — Z1211 Encounter for screening for malignant neoplasm of colon: Secondary | ICD-10-CM | POA: Insufficient documentation

## 2014-06-26 DIAGNOSIS — Z87891 Personal history of nicotine dependence: Secondary | ICD-10-CM | POA: Insufficient documentation

## 2014-06-26 MED ORDER — DICLOFENAC SODIUM 75 MG PO TBEC: 75.0000 mg | DELAYED_RELEASE_TABLET | Freq: Two times a day (BID) | ORAL | Status: DC

## 2014-06-26 MED ORDER — AMLODIPINE BESYLATE 10 MG PO TABS: 10.0000 mg | ORAL_TABLET | Freq: Every day | ORAL | Status: DC

## 2014-06-26 NOTE — Patient Instructions (Signed)
DASH Eating Plan °DASH stands for "Dietary Approaches to Stop Hypertension." The DASH eating plan is a healthy eating plan that has been shown to reduce high blood pressure (hypertension). Additional health benefits may include reducing the risk of type 2 diabetes mellitus, heart disease, and stroke. The DASH eating plan may also help with weight loss. °WHAT DO I NEED TO KNOW ABOUT THE DASH EATING PLAN? °For the DASH eating plan, you will follow these general guidelines: °· Choose foods with a percent daily value for sodium of less than 5% (as listed on the food label). °· Use salt-free seasonings or herbs instead of table salt or sea salt. °· Check with your health care provider or pharmacist before using salt substitutes. °· Eat lower-sodium products, often labeled as "lower sodium" or "no salt added." °· Eat fresh foods. °· Eat more vegetables, fruits, and low-fat dairy products. °· Choose whole grains. Look for the word "whole" as the first word in the ingredient list. °· Choose fish and skinless chicken or turkey more often than red meat. Limit fish, poultry, and meat to 6 oz (170 g) each day. °· Limit sweets, desserts, sugars, and sugary drinks. °· Choose heart-healthy fats. °· Limit cheese to 1 oz (28 g) per day. °· Eat more home-cooked food and less restaurant, buffet, and fast food. °· Limit fried foods. °· Cook foods using methods other than frying. °· Limit canned vegetables. If you do use them, rinse them well to decrease the sodium. °· When eating at a restaurant, ask that your food be prepared with less salt, or no salt if possible. °WHAT FOODS CAN I EAT? °Seek help from a dietitian for individual calorie needs. °Grains °Whole grain or whole wheat bread. Brown rice. Whole grain or whole wheat pasta. Quinoa, bulgur, and whole grain cereals. Low-sodium cereals. Corn or whole wheat flour tortillas. Whole grain cornbread. Whole grain crackers. Low-sodium crackers. °Vegetables °Fresh or frozen vegetables  (raw, steamed, roasted, or grilled). Low-sodium or reduced-sodium tomato and vegetable juices. Low-sodium or reduced-sodium tomato sauce and paste. Low-sodium or reduced-sodium canned vegetables.  °Fruits °All fresh, canned (in natural juice), or frozen fruits. °Meat and Other Protein Products °Ground beef (85% or leaner), grass-fed beef, or beef trimmed of fat. Skinless chicken or turkey. Ground chicken or turkey. Pork trimmed of fat. All fish and seafood. Eggs. Dried beans, peas, or lentils. Unsalted nuts and seeds. Unsalted canned beans. °Dairy °Low-fat dairy products, such as skim or 1% milk, 2% or reduced-fat cheeses, low-fat ricotta or cottage cheese, or plain low-fat yogurt. Low-sodium or reduced-sodium cheeses. °Fats and Oils °Tub margarines without trans fats. Light or reduced-fat mayonnaise and salad dressings (reduced sodium). Avocado. Safflower, olive, or canola oils. Natural peanut or almond butter. °Other °Unsalted popcorn and pretzels. °The items listed above may not be a complete list of recommended foods or beverages. Contact your dietitian for more options. °WHAT FOODS ARE NOT RECOMMENDED? °Grains °White bread. White pasta. White rice. Refined cornbread. Bagels and croissants. Crackers that contain trans fat. °Vegetables °Creamed or fried vegetables. Vegetables in a cheese sauce. Regular canned vegetables. Regular canned tomato sauce and paste. Regular tomato and vegetable juices. °Fruits °Dried fruits. Canned fruit in light or heavy syrup. Fruit juice. °Meat and Other Protein Products °Fatty cuts of meat. Ribs, chicken wings, bacon, sausage, bologna, salami, chitterlings, fatback, hot dogs, bratwurst, and packaged luncheon meats. Salted nuts and seeds. Canned beans with salt. °Dairy °Whole or 2% milk, cream, half-and-half, and cream cheese. Whole-fat or sweetened yogurt. Full-fat   cheeses or blue cheese. Nondairy creamers and whipped toppings. Processed cheese, cheese spreads, or cheese  curds. °Condiments °Onion and garlic salt, seasoned salt, table salt, and sea salt. Canned and packaged gravies. Worcestershire sauce. Tartar sauce. Barbecue sauce. Teriyaki sauce. Soy sauce, including reduced sodium. Steak sauce. Fish sauce. Oyster sauce. Cocktail sauce. Horseradish. Ketchup and mustard. Meat flavorings and tenderizers. Bouillon cubes. Hot sauce. Tabasco sauce. Marinades. Taco seasonings. Relishes. °Fats and Oils °Butter, stick margarine, lard, shortening, ghee, and bacon fat. Coconut, palm kernel, or palm oils. Regular salad dressings. °Other °Pickles and olives. Salted popcorn and pretzels. °The items listed above may not be a complete list of foods and beverages to avoid. Contact your dietitian for more information. °WHERE CAN I FIND MORE INFORMATION? °National Heart, [employer], and [employer] Institute: www.nhlbi.nih.gov/health/health-topics/topics/dash/ °Document Released: 04/14/2011 Document Revised: 09/09/2013 Document Reviewed: 02/27/2013 °ExitCare® Patient Information ©2015 [employer]. This information is not intended to replace advice given to you by your health care provider. Make sure you discuss any questions you have with your health care provider. ° °

## 2014-06-26 NOTE — Progress Notes (Signed)
Patient ID: Stephanie Juarez, female   DOB: 11/07/1950, 64 y.o.   MRN: [id number]  CC: HTN f/u   HPI: Stephanie Juarez is a 64 y.o. female here today for a follow up visit.  Patient has past medical history of hypertension, depression, and arthritis.  Patient reports that she has been out of her Norvasc for the past 6 months. She states that she was busy and forgot to come to the clinic to get her medication refilled. She has been having c/o of blurred vision and headaches. She denies edema, chest pain, palpitations, or SOB. She complains of generalized body aches and uses.  In need of pap, mammogram, and colonoscopy. She will come back for a physical. She continues to c/o short term memory loss. She has been evaluated by psychiatry and was told to inform her PCP. As stated in previous notes she lost her job last year due to poor memory. She states that she often forgets why she walked in her room or called a friend within minutes.   No Known Allergies Past Medical History  Diagnosis Date  . Hypertension   . Arthritis    Current Outpatient Prescriptions on File Prior to Visit  Medication Sig Dispense Refill  . amLODipine (NORVASC) 10 MG tablet Take 1 tablet (10 mg total) by mouth daily. 30 tablet 2  . diclofenac sodium (VOLTAREN) 1 % GEL Apply 2 g topically 4 (four) times daily. (Patient not taking: Reported on 06/26/2014) 100 g 1  . ibuprofen (ADVIL,MOTRIN) 800 MG tablet Take 1 tablet (800 mg total) by mouth 3 (three) times daily. (Patient not taking: Reported on 06/26/2014) 21 tablet 0  . loratadine (CLARITIN) 10 MG tablet Take 1 tablet (10 mg total) by mouth daily. (Patient not taking: Reported on 06/26/2014) 30 tablet 11  . OVER THE COUNTER MEDICATION Take 1 tablet by mouth daily as needed (arthritis relief).     No current facility-administered medications on file prior to visit.   Family History  Problem Relation Age of Onset  . Arthritis Mother   . Heart disease Mother   . Heart disease  Father    History   Social History  . Marital Status: Single    Spouse Name: N/A  . Number of Children: N/A  . Years of Education: N/A   Occupational History  . Not on file.   Social History Main Topics  . Smoking status: Former Games developermoker  . Smokeless tobacco: Not on file  . Alcohol Use: No  . Drug Use: No  . Sexual Activity: Not on file   Other Topics Concern  . Not on file   Social History Narrative    Review of Systems  Eyes: Positive for blurred vision.  Genitourinary:       Stress incontinence   Psychiatric/Behavioral: Positive for memory loss (has not seen neurology).  All other systems reviewed and are negative.     Objective:   Filed Vitals:   06/26/14 1137  BP: 160/81  Pulse: 92  Temp: 98.5 F (36.9 C)  Resp: 18    Physical Exam  Constitutional: She is oriented to person, place, and time.  Cardiovascular: Normal rate, regular rhythm and normal heart sounds.   Pulmonary/Chest: Effort normal and breath sounds normal.  Musculoskeletal: She exhibits no edema.  Neurological: She is alert and oriented to person, place, and time.     Lab Results  Component Value Date   WBC 5.3 07/29/2013   HGB 12.8 07/29/2013   HCT 40.9  07/29/2013   MCV 87.4 07/29/2013   PLT 160 07/29/2013   Lab Results  Component Value Date   CREATININE 0.60 07/29/2013   BUN 10 07/29/2013   NA 143 07/29/2013   K 3.7 07/29/2013   CL 104 07/29/2013   CO2 27 07/29/2013    No results found for: HGBA1C Lipid Panel  No results found for: CHOL, TRIG, HDL, CHOLHDL, VLDL, LDLCALC     Assessment and plan:   Donisha was seen today for follow-up, hypertension and medication refill.  Diagnoses and all orders for this visit:  Essential hypertension Refilled Norvasc 10 mg. Unable to determine control because patient has been non-compliant with regimen. Likely the cause of headaches and blurred vision.  Memory loss Orders: -     Ambulatory referral to Neurology Patient  currently on Latuda for depression/bipolar disorder? Patient is a poor historian, will request records from Psych   Colon cancer screening Orders: -     [facility] COLONOSCOPY -     Tdap vaccine greater than or equal to 7yo IM  Arthralgia  Diclofenac 75 mg BID PRN for pain  Return in about 1 month (around 07/25/2014) for physical with pap.       Holland Commons, NP-C [facility] [phone] 06/26/2014, 11:46 AM

## 2014-06-26 NOTE — Progress Notes (Signed)
Pt comes in for wellness check up for HTN, Med refill Pt ran out of BP med in August C/o headache, blurry vision Taking medication for depression Health maintenance: Colonoscopy Mammogram Pap smear  BP- 160/81 92

## 2014-07-14 ENCOUNTER — Ambulatory Visit: Payer: Self-pay

## 2014-08-05 ENCOUNTER — Emergency Department (INDEPENDENT_AMBULATORY_CARE_PROVIDER_SITE_OTHER)
Admission: EM | Admit: 2014-08-05 | Discharge: 2014-08-05 | Disposition: A | Discharge status: Home / Self Care | Payer: Self-pay | Source: Home / Self Care | Attending: Emergency Medicine | Admitting: Emergency Medicine

## 2014-08-05 ENCOUNTER — Encounter (HOSPITAL_COMMUNITY): Payer: Self-pay | Admitting: Emergency Medicine

## 2014-08-05 DIAGNOSIS — M25562 Pain in left knee: Secondary | ICD-10-CM

## 2014-08-05 MED ORDER — MELOXICAM 7.5 MG PO TABS: 7.5000 mg | ORAL_TABLET | Freq: Every day | ORAL | Status: DC

## 2014-08-05 MED ORDER — METHYLPREDNISOLONE ACETATE 40 MG/ML IJ SUSP
INTRAMUSCULAR | Status: AC
  Filled 2014-08-05: qty 1

## 2014-08-05 MED ORDER — PENTAFLUOROPROP-TETRAFLUOROETH EX AERO
INHALATION_SPRAY | CUTANEOUS | Status: AC
  Filled 2014-08-05: qty 103.5

## 2014-08-05 NOTE — Discharge Instructions (Signed)
You may have torn your meniscus. We did a steroid injection today. Ice your knee tonight. Take meloxicam daily for the next 5 days, then as needed. Do not take ibuprofen, Advil, Aleve, diclofenac, Naprosyn with this medicine. You can take Tylenol per packaging if needed. The knee should start to feel better in the next 2 days. The steroid injection can last anywhere from a few weeks to several months. If you develop locking or clicking in the knee, you will need to see an orthopedic surgeon.

## 2014-08-05 NOTE — ED Provider Notes (Signed)
CSN: [id number]     Arrival date & time 08/05/14  1438 History   First MD Initiated Contact with Patient 08/05/14 1550     Chief Complaint  Patient presents with  . Knee Pain   (Consider location/radiation/quality/duration/timing/severity/associated sxs/prior Treatment) HPI  She is a 64 year old woman here for evaluation of left knee pain. She states she has chronic knee pain that has been under decent control. However, she was getting out of her car this morning and twisted her knee with immediate increase in pain. The pain is located on the posterior and lateral aspects. It is worse with full extension and weight bearing.  She denies any locking or clicking at this time.   Past Medical History  Diagnosis Date  . Hypertension   . Arthritis    Past Surgical History  Procedure Laterality Date  . Shoulder arthroscopy Left 2001    for cyst  . Mandible surgery Left 1981    impacted teeth   Family History  Problem Relation Age of Onset  . Arthritis Mother   . Heart disease Mother   . Heart disease Father    History  Substance Use Topics  . Smoking status: Former Games developer  . Smokeless tobacco: Not on file  . Alcohol Use: No   OB History    No data available     Review of Systems As in history of present illness Allergies  Review of patient's allergies indicates no known allergies.  Home Medications   Prior to Admission medications   Medication Sig Start Date End Date Taking? Authorizing Provider  amLODipine (NORVASC) 10 MG tablet Take 1 tablet (10 mg total) by mouth daily. 06/26/14   Ambrose Finland, NP  diclofenac (VOLTAREN) 75 MG EC tablet Take 1 tablet (75 mg total) by mouth 2 (two) times daily. As needed for pain 06/26/14   Ambrose Finland, NP  lurasidone (LATUDA) 40 MG TABS tablet Take 40 mg by mouth daily with breakfast.    Historical Provider, MD  meloxicam (MOBIC) 7.5 MG tablet Take 1 tablet (7.5 mg total) by mouth daily. For 5 days, then as needed. 08/05/14   Charm Rings, MD  OVER THE COUNTER MEDICATION Take 1 tablet by mouth daily as needed (arthritis relief).    Historical Provider, MD   BP 159/82 mmHg  Pulse 75  Temp(Src) 98.1 F (36.7 C) (Oral)  Resp 16  SpO2 98% Physical Exam  Constitutional: She is oriented to person, place, and time. She appears well-developed and well-nourished.  Cardiovascular: Normal rate.   Pulmonary/Chest: Effort normal.  Musculoskeletal:  Left knee: No erythema or edema. No appreciable joint effusion. No tenderness of the patella or patellar tendon. She has pain at the lateral joint line. Extension is slightly limited. Unable to do McMurray's due to pain and discomfort.  Neurological: She is alert and oriented to person, place, and time.    ED Course  Injection of joint Date/Time: 08/05/2014 5:29 PM Performed by: Charm Rings Authorized by: Charm Rings Consent: Verbal consent obtained. Risks and benefits: risks, benefits and alternatives were discussed Consent given by: patient Patient understanding: patient states understanding of the procedure being performed Patient identity confirmed: verbally with patient Time out: Immediately prior to procedure a "time out" was called to verify the correct patient, procedure, equipment, support staff and site/side marked as required. Patient tolerance: Patient tolerated the procedure well with no immediate complications Comments: Verbal consent obtained. Cold spray was used for anesthetic. 4 mL of  2% lidocaine and 40 mg Depo-Medrol was injected into the left knee. Patient tolerated procedure well.   (including critical care time) Labs Review Labs Reviewed - No data to display  Imaging Review No results found.   MDM   1. Left knee pain    I suspect she may have torn her meniscus. Steroid injection performed today. Meloxicam daily for the next 5 days. Discussed that if she develops locking of the knee, she will need to see orthopedic surgery.    Charm RingsErin J  Vignesh Willert, MD 08/05/14 [phone]

## 2014-08-05 NOTE — ED Notes (Signed)
Pt states that her left knee is hurting more than usual and her pain pills she takes are not helping

## 2014-11-27 ENCOUNTER — Encounter: Payer: Self-pay | Admitting: Internal Medicine

## 2014-11-27 ENCOUNTER — Telehealth: Payer: Self-pay | Admitting: Clinical

## 2014-11-27 ENCOUNTER — Ambulatory Visit: Payer: Self-pay | Attending: Internal Medicine | Admitting: Internal Medicine

## 2014-11-27 VITALS — BP 140/75 | HR 79 | Temp 98.3°F | Resp 16 | Ht 65.0 in | Wt 269.6 lb

## 2014-11-27 DIAGNOSIS — M25512 Pain in left shoulder: Secondary | ICD-10-CM | POA: Insufficient documentation

## 2014-11-27 DIAGNOSIS — R413 Other amnesia: Secondary | ICD-10-CM | POA: Insufficient documentation

## 2014-11-27 DIAGNOSIS — M25511 Pain in right shoulder: Secondary | ICD-10-CM | POA: Insufficient documentation

## 2014-11-27 MED ORDER — MELOXICAM 15 MG PO TABS: 15.0000 mg | ORAL_TABLET | Freq: Every day | ORAL | Status: DC

## 2014-11-27 NOTE — Progress Notes (Signed)
Patient ID: Stephanie Juarez, female   DPOPPI [id number], 64 y.o.   MRN: [id number]  CC: shoulder pain  HPI: Stephanie Juarez is a 64 y.o. female here today for a follow up visit.  Patient has past medical history of HTN , arthritis, and bipolar depression.  She reports that she has been evaluated by [facility] and was disgnosed with Bipolar but she thinks she has been misdiagnosed. She feels like she is ADHD and would like to try medication today for it. She states that she cannot organize her thoughts, focus on reading, or manage more than one task at a time. She reports that her son has ADHD and he is not willing to let her try his medication.  Bilateral shoulder pain mostly at night while laying down. She states that holding her arm up in the air helps with the pain. WHen she drops her arm it aggravates the arm. She has tried Aleve for pain. Patient has pain located in both shoulders rated at a 3, described as constant. Pain has been occurring for several months. Patient requests something for pain.   Patient has No headache, No chest pain, No abdominal pain - No Nausea, No new weakness tingling or numbness, No Cough - SOB.  No Known Allergies Past Medical History  Diagnosis Date  . Hypertension   . Arthritis    Current Outpatient Prescriptions on File Prior to Visit  Medication Sig Dispense Refill  . amLODipine (NORVASC) 10 MG tablet Take 1 tablet (10 mg total) by mouth daily. 30 tablet 5  . lurasidone (LATUDA) 40 MG TABS tablet Take 40 mg by mouth daily with breakfast.    . diclofenac (VOLTAREN) 75 MG EC tablet Take 1 tablet (75 mg total) by mouth 2 (two) times daily. As needed for pain (Patient not taking: Reported on 11/27/2014) 60 tablet 1  . meloxicam (MOBIC) 7.5 MG tablet Take 1 tablet (7.5 mg total) by mouth daily. For 5 days, then as needed. (Patient not taking: Reported on 11/27/2014) 30 tablet 0  . OVER THE COUNTER MEDICATION Take 1 tablet by mouth daily as needed (arthritis relief).    .  [DISCONTINUED] loratadine (CLARITIN) 10 MG tablet Take 1 tablet (10 mg total) by mouth daily. (Patient not taking: Reported on 06/26/2014) 30 tablet 11   No current facility-administered medications on file prior to visit.   Family History  Problem Relation Age of Onset  . Arthritis Mother   . Heart disease Mother   . Heart disease Father    History   Social History  . Marital Status: Single    Spouse Name: N/A  . Number of Children: N/A  . Years of Education: N/A   Occupational History  . Not on file.   Social History Main Topics  . Smoking status: Former Games developer  . Smokeless tobacco: Not on file  . Alcohol Use: No  . Drug Use: No  . Sexual Activity: Not on file   Other Topics Concern  . Not on file   Social History Narrative    Review of Systems  Musculoskeletal: Positive for joint pain.  Psychiatric/Behavioral: Positive for depression and memory loss. Negative for hallucinations and substance abuse. The patient is not nervous/anxious.   All other systems reviewed and are negative.   Objective:   Filed Vitals:   11/27/14 1229  BP: 140/75  Pulse: 79  Temp: 98.3 F (36.8 C)  Resp: 16    Physical Exam  Constitutional: She is oriented to person, place,  and time.  Cardiovascular: Normal rate, regular rhythm and normal heart sounds.   Pulmonary/Chest: Effort normal and breath sounds normal.  Musculoskeletal: Normal range of motion. She exhibits no tenderness.  Neurological: She is alert and oriented to person, place, and time.  Skin: Skin is warm and dry.  Psychiatric: She has a normal mood and affect.    Lab Results  Component Value Date   WBC 5.3 07/29/2013   HGB 12.8 07/29/2013   HCT 40.9 07/29/2013   MCV 87.4 07/29/2013   PLT 160 07/29/2013   Lab Results  Component Value Date   CREATININE 0.60 07/29/2013   BUN 10 07/29/2013   NA 143 07/29/2013   K 3.7 07/29/2013   CL 104 07/29/2013   CO2 27 07/29/2013    No results found for: HGBA1C Lipid  Panel  No results found for: CHOL, TRIG, HDL, CHOLHDL, VLDL, LDLCALC     Assessment and plan:   Aleyda was seen today for medication management and shoulder pain.  Diagnoses and all orders for this visit:  Bilateral shoulder pain Orders: -     meloxicam (MOBIC) 15 MG tablet; Take 1 tablet (15 mg total) by mouth daily. For 5 days, then as needed. Patient has history of arthritis   Memory loss Orders: -     Ambulatory referral to Neurology Explained to patient that ADHD medication and testing must come from psychiatry. It is highly unlikely that patient will develop ADHD at this age.  Return in about 3 months (around 02/27/2015) for Hypertension.       Ambrose Finland, NP-C [facility] [phone] 11/27/2014, 12:51 PM

## 2014-11-27 NOTE — Progress Notes (Signed)
Patient here for medication management and shoulder pain. Patient states she was misdiagnosed at [facility] with bipolar. Patient states she thinks she has ADHD and wants to try a medication for it. Patient has pain located in both shoulders rated at a 3, described as constant. Patient reports it hurts mostly when laying down. Pain has been occurring for a while, over months. Patient requests something for pain. Patient states she is taking another medication that was prescribed by another doctor that makes her happy and starts with a c.

## 2014-11-27 NOTE — Patient Instructions (Signed)

## 2014-11-27 NOTE — Telephone Encounter (Signed)
F/u w pt; she will come in on 11-28-14  to see [facility].

## 2014-11-28 ENCOUNTER — Ambulatory Visit: Payer: Self-pay | Attending: Internal Medicine | Admitting: Clinical

## 2014-11-28 DIAGNOSIS — F329 Major depressive disorder, single episode, unspecified: Secondary | ICD-10-CM

## 2014-11-28 DIAGNOSIS — F32A Depression, unspecified: Secondary | ICD-10-CM

## 2014-11-28 NOTE — Progress Notes (Signed)
ASSESSMENT: Pt currently experiencing symptoms of depression; pt needs to f/u with PCP and [facility]. Pt would benefit from further assessment of Adult ADHD, along with psychoeducation and supportive counseling regarding coping with symptoms of depression.  Stage of Change: contemplative  PLAN: 1. F/U with behavioral health consultant in as needed 2. Psychiatric Medications: Latuda. 3. Behavioral recommendation(s):   -Consider reading over educational materials regarding symptoms of depression -Set up appointment with [facility] for further Adult ADHD assessment -Consider [facility] classes for women SUBJECTIVE: Pt. referred by Holland Commons, NP for counseling:  Pt. reports the following symptoms/concerns: Pt states that she believes she was misdiagnosed as having bipolar disorder, but she thinks she may have adult ADHD. She is having difficulty sleeping, is overeating, feels lonely, sad and useless. She lost her job in March 2015, then lost her mother in October 2015; she no longer has work or others to care for, and doesn't feel she can focus. 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  Duration of problem: 9 months Severity: moderate  OBJECTIVE: Orientation & Cognition: Oriented x3. Thought processes normal and appropriate to situation. Mood: appropriate. Affect: appropriate Appearance: appropriate Risk of harm to self or others: no risk of harm to self or others Substance use: none Assessments administered: 1. Differential diagnosis screening to rule out grief reaction and bipolar disorder, negative for both    2.  ASRS to screen for adult ADHD: Part A(4) & B(3), warrants further assessment   Diagnosis: Depression CPT Code: F32.9 -------------------------------------------- Other(s) present in the room: none  Time spent with patient in exam room: 60 minutes

## 2014-12-12 ENCOUNTER — Ambulatory Visit: Payer: Self-pay | Attending: Internal Medicine

## 2014-12-17 ENCOUNTER — Ambulatory Visit: Payer: Self-pay | Attending: Internal Medicine

## 2015-01-16 ENCOUNTER — Emergency Department (INDEPENDENT_AMBULATORY_CARE_PROVIDER_SITE_OTHER)
Admission: EM | Admit: 2015-01-16 | Discharge: 2015-01-16 | Disposition: A | Discharge status: Home / Self Care | Payer: Self-pay | Source: Home / Self Care | Attending: Emergency Medicine | Admitting: Emergency Medicine

## 2015-01-16 ENCOUNTER — Encounter (HOSPITAL_COMMUNITY): Payer: Self-pay | Admitting: *Deleted

## 2015-01-16 DIAGNOSIS — M609 Myositis, unspecified: Secondary | ICD-10-CM

## 2015-01-16 DIAGNOSIS — M542 Cervicalgia: Secondary | ICD-10-CM

## 2015-01-16 DIAGNOSIS — M25562 Pain in left knee: Secondary | ICD-10-CM

## 2015-01-16 MED ORDER — KETOROLAC TROMETHAMINE 60 MG/2ML IM SOLN
INTRAMUSCULAR | Status: AC
  Filled 2015-01-16: qty 2

## 2015-01-16 MED ORDER — TRAMADOL HCL 50 MG PO TABS: 50.0000 mg | ORAL_TABLET | Freq: Four times a day (QID) | ORAL | Status: DC | PRN

## 2015-01-16 MED ORDER — DICLOFENAC SODIUM 1 % TD GEL: 1.0000 "application " | Freq: Four times a day (QID) | TRANSDERMAL | Status: DC

## 2015-01-16 MED ORDER — KETOROLAC TROMETHAMINE 60 MG/2ML IM SOLN
60.0000 mg | Freq: Once | INTRAMUSCULAR | Status: AC
  Administered 2015-01-16: 60 mg via INTRAMUSCULAR

## 2015-01-16 NOTE — ED Provider Notes (Signed)
CSN: [id number]     Arrival date & time 01/16/15  1305 History   First MD Initiated Contact with Patient 01/16/15 1405     Chief Complaint  Patient presents with  . Shoulder Pain   (Consider location/radiation/quality/duration/timing/severity/associated sxs/prior Treatment) HPI Comments: 64 year old female with chronic bilateral shoulder pain. This pain is been present for several months. She has seen her PCP for this twice and has been given muscle relaxants and anti-inflammatory spit it does not seem to help. 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 It is worse when using her phone, computer or reading a book. Some of the pain in her shoulder is relieved when she abducts it over her head at 180. She denies pain to the posterior neck. She denies any known injury. She points to the posterior neck over the splenius capitis muscles as well as the trapezius muscles as the primary source of pain. There is also discomfort in the deltoid muscle. Pain is worse at night when supine. Denies focal weakness.  Second complaint is that of chronic left knee pain. She was seen in the urgent care here several months ago and was advised that she may have a torn meniscus. She received an injection of Medrol and Xylocaine which helped her knee. The pain in the anterior and lateral and medial aspect of the knee improved. She still has chronic constant discomfort in the posterior aspect of the knee. It is worse when standing and rotating with the knee.   Past Medical History  Diagnosis Date  . Hypertension   . Arthritis    Past Surgical History  Procedure Laterality Date  . Shoulder arthroscopy Left 2001    for cyst  . Mandible surgery Left 1981    impacted teeth   Family History  Problem Relation Age of Onset  . Arthritis Mother   . Heart disease Mother   . Heart disease Father    Social History  Substance Use  Topics  . Smoking status: Former Games developer  . Smokeless tobacco: None  . Alcohol Use: No   OB History    No data available     Review of Systems  Constitutional: Negative for fever, chills and activity change.  HENT: Negative.   Respiratory: Negative.   Cardiovascular: Negative.   Musculoskeletal: Positive for myalgias and neck pain. Negative for back pain.       As per HPI  Skin: Negative for color change, pallor and rash.  Neurological: Negative.   All other systems reviewed and are negative.   Allergies  Review of patient's allergies indicates no known allergies.  Home Medications   Prior to Admission medications   Medication Sig Start Date End Date Taking? Authorizing Provider  amLODipine (NORVASC) 10 MG tablet Take 1 tablet (10 mg total) by mouth daily. 06/26/14   Ambrose Finland, NP  diclofenac sodium (VOLTAREN) 1 % GEL Apply 1 application topically 4 (four) times daily. 01/16/15   Hayden Rasmussen, NP  lurasidone (LATUDA) 40 MG TABS tablet Take 40 mg by mouth daily with breakfast.    Historical Provider, MD  meloxicam (MOBIC) 15 MG tablet Take 1 tablet (15 mg total) by mouth daily. For 5 days, then as needed. 11/27/14   Ambrose Finland, NP  OVER THE COUNTER MEDICATION Take 1 tablet by mouth daily as needed (arthritis relief).    Historical Provider, MD  traMADol (  ULTRAM) 50 MG tablet Take 1 tablet (50 mg total) by mouth every 6 (six) hours as needed. 01/16/15   Hayden Rasmussen, NP   Meds Ordered and Administered this Visit   Medications  ketorolac (TORADOL) injection 60 mg (not administered)    BP 125/88 mmHg  Pulse 79  Temp(Src) 99.2 F (37.3 C) (Oral)  Resp 16  SpO2 98% No data found.   Physical Exam  Constitutional: She is oriented to person, place, and time. She appears well-developed and well-nourished. No distress.  HENT:  Head: Normocephalic and atraumatic.  Eyes: EOM are normal. Pupils are equal, round, and reactive to light.  Neck: Normal range of motion. Neck supple.   Cardiovascular: Normal rate.   Pulmonary/Chest: Effort normal. No respiratory distress.  Musculoskeletal: She exhibits no edema.  Tenderness over the splenius capitis muscle. Tenderness over the ridge of the trapezius and the posterior aspect of the trapezius muscle as well as the supraspinatus bilaterally. Mild tenderness over the deltoid muscle. No tenderness over the cervical spine. No deformity, swelling or discoloration.  There is tenderness to the posterior left knee. She is able to extend 180 and flex to 80. Minor pain with weightbearing.  Neurological: She is alert and oriented to person, place, and time. No cranial nerve deficit.  Skin: Skin is warm and dry.  Nursing note and vitals reviewed.   ED Course  Procedures (including critical care time)  Labs Review Labs Reviewed - No data to display  Imaging Review No results found.   Visual Acuity Review  Right Eye Distance:   Left Eye Distance:   Bilateral Distance:    Right Eye Near:   Left Eye Near:    Bilateral Near:         MDM   1. Myofasciitis   2. Muscle pain, cervical   3. Left knee pain    Apply the gel to the neck and shoulder muscles as directed and then apply heat . Tramadol for pain Need to have physical therapy, massage and proper stretching. Need to see ortho for knee and sports Med for neck and muscle pains F/u with PCP as needed    Hayden Rasmussen, NP 01/16/15 1506

## 2015-01-16 NOTE — Discharge Instructions (Signed)
Myofascial Pain Syndrome Apply the gel to the neck and shoulder muscles as directed and then apply heat . Tramadol for pain Need to have physical therapy, massage and proper stretching. Myofascial pain syndrome is a pain disorder. This pain may be felt in the muscles. It may come and go. Myofascial pain syndrome always has trigger or tender points in the muscle that will cause pain when pressed.  CAUSES Myofascial pain may be caused by injuries, especially auto accidents, or by overuse of certain muscles. Typically the pain is long lasting. It is made worse by overuse of the involved muscles, emotional distress, and by cold, damp weather. Myofascial pain syndrome often develops in patients whose response to stress is an increase in muscle tone, and is seen in greater frequency in patients with pre-existing tension headaches. SYMPTOMS  Myofascial pain syndrome causes a wide variety of symptoms. You may see tight ropy bands of muscle. Problems may also include aching, cramping, burning, numbness, tingling, and other uncomfortable sensations in muscular areas. It most commonly affects the neck, upper back, and shoulder areas. Pain often radiates into the arms and hands.  TREATMENT Treatment includes resting the affected muscular area and applying ice packs to reduce spasm and pain. Trigger point injection, is a valuable initial therapy. This therapy is an injection of local anesthetic directly into the trigger point. Trigger points are often present at the source of pain. Pain relief following injection confirms the diagnosis of myofascial pain syndrome. Fairly vigorous therapy can be carried out during the pain-free period after each injection. Stretching exercises to loosen up the muscles are also useful. Transcutaneous electrical nerve stimulation (TENS) may provide relief from pain. TENS is the use of electric current produced by a device to stimulate the nerves. Ultrasound therapy applied directly over  the affected muscle may also provide pain relief. Anti-inflammatory pain medicine can be helpful. Symptoms will gradually improve over a period of weeks to months with proper treatment. HOME CARE INSTRUCTIONS Call your caregiver for follow-up care as recommended.  SEEK MEDICAL CARE IF:  Your pain is severe and not helped with medications. Document Released: 06/02/2004 Document Revised: 07/18/2011 Document Reviewed: 06/11/2010 [facility] Patient Information 2015 [place], [place]. This information is not intended to replace advice given to you by your health care provider. Make sure you discuss any questions you have with your health care provider.  Muscle Pain Muscle pain (myalgia) may be caused by many things, including:  Overuse or muscle strain, especially if you are not in shape. This is the most common cause of muscle pain.  Injury.  Bruises.  Viruses, such as the flu.  Infectious diseases.  Fibromyalgia, which is a chronic condition that causes muscle tenderness, fatigue, and headache.  Autoimmune diseases, including lupus.  Certain drugs, including ACE inhibitors and statins. Muscle pain may be mild or severe. In most cases, the pain lasts only a short time and goes away without treatment. To diagnose the cause of your muscle pain, your health care provider will take your medical history. This means he or she will ask you when your muscle pain began and what has been happening. If you have not had muscle pain for very long, your health care provider may want to wait before doing much testing. If your muscle pain has lasted a long time, your health care provider may want to run tests right away. If your health care provider thinks your muscle pain may be caused by illness, you may need to have additional tests  to rule out certain conditions.  Treatment for muscle pain depends on the cause. Home care is often enough to relieve muscle pain. Your health care provider may also prescribe  anti-inflammatory medicine. HOME CARE INSTRUCTIONS Watch your condition for any changes. The following actions may help to lessen any discomfort you are feeling:  Only take over-the-counter or prescription medicines as directed by your health care provider.  Apply ice to the sore muscle:  Put ice in a plastic bag.  Place a towel between your skin and the bag.  Leave the ice on for 15-20 minutes, 3-4 times a day.  You may alternate applying hot and cold packs to the muscle as directed by your health care provider.  If overuse is causing your muscle pain, slow down your activities until the pain goes away.  Remember that it is normal to feel some muscle pain after starting a workout program. Muscles that have not been used often will be sore at first.  Do regular, gentle exercises if you are not usually active.  Warm up before exercising to lower your risk of muscle pain.  Do not continue working out if the pain is very bad. Bad pain could mean you have injured a muscle. SEEK MEDICAL CARE IF:  Your muscle pain gets worse, and medicines do not help.  You have muscle pain that lasts longer than 3 days.  You have a rash or fever along with muscle pain.  You have muscle pain after a tick bite.  You have muscle pain while working out, even though you are in good physical condition.  You have redness, soreness, or swelling along with muscle pain.  You have muscle pain after starting a new medicine or changing the dose of a medicine. SEEK IMMEDIATE MEDICAL CARE IF:  You have trouble breathing.  You have trouble swallowing.  You have muscle pain along with a stiff neck, fever, and vomiting.  You have severe muscle weakness or cannot move part of your body. MAKE SURE YOU:   Understand these instructions.  Will watch your condition.  Will get help right away if you are not doing well or get worse. Document Released: 03/17/2006 Document Revised: 04/30/2013 Document  Reviewed: 02/19/2013 [facility] Patient Information 2015 [place], [place]. This information is not intended to replace advice given to you by your health care provider. Make sure you discuss any questions you have with your health care provider.  Knee Pain The knee is the complex joint between your thigh and your lower leg. It is made up of bones, tendons, ligaments, and cartilage. The bones that make up the knee are:  The femur in the thigh.  The tibia and fibula in the lower leg.  The patella or kneecap riding in the groove on the lower femur. CAUSES  Knee pain is a common complaint with many causes. A few of these causes are:  Injury, such as:  A ruptured ligament or tendon injury.  Torn cartilage.  Medical conditions, such as:  Gout  Arthritis  Infections  Overuse, over training, or overdoing a physical activity. Knee pain can be minor or severe. Knee pain can accompany debilitating injury. Minor knee problems often respond well to self-care measures or get well on their own. More serious injuries may need medical intervention or even surgery. SYMPTOMS The knee is complex. Symptoms of knee problems can vary widely. Some of the problems are:  Pain with movement and weight bearing.  Swelling and tenderness.  Buckling of the knee.  Inability to straighten or extend your knee.  Your knee locks and you cannot straighten it.  Warmth and redness with pain and fever.  Deformity or dislocation of the kneecap. DIAGNOSIS  Determining what is wrong may be very straight forward such as when there is an injury. It can also be challenging because of the complexity of the knee. Tests to make a diagnosis may include:  Your caregiver taking a history and doing a physical exam.  Routine X-rays can be used to rule out other problems. X-rays will not reveal a cartilage tear. Some injuries of the knee can be diagnosed by:  Arthroscopy a surgical technique by which a small video camera  is inserted through tiny incisions on the sides of the knee. This procedure is used to examine and repair internal knee joint problems. Tiny instruments can be used during arthroscopy to repair the torn knee cartilage (meniscus).  Arthrography is a radiology technique. A contrast liquid is directly injected into the knee joint. Internal structures of the knee joint then become visible on X-ray film.  An MRI scan is a non X-ray radiology procedure in which magnetic fields and a computer produce two- or three-dimensional images of the inside of the knee. Cartilage tears are often visible using an MRI scanner. MRI scans have largely replaced arthrography in diagnosing cartilage tears of the knee.  Blood work.  Examination of the fluid that helps to lubricate the knee joint (synovial fluid). This is done by taking a sample out using a needle and a syringe. TREATMENT The treatment of knee problems depends on the cause. Some of these treatments are:  Depending on the injury, proper casting, splinting, surgery, or physical therapy care will be needed.  Give yourself adequate recovery time. Do not overuse your joints. If you begin to get sore during workout routines, back off. Slow down or do fewer repetitions.  For repetitive activities such as cycling or running, maintain your strength and nutrition.  Alternate muscle groups. For example, if you are a weight lifter, work the upper body on one day and the lower body the next.  Either tight or weak muscles do not give the proper support for your knee. Tight or weak muscles do not absorb the stress placed on the knee joint. Keep the muscles surrounding the knee strong.  Take care of mechanical problems.  If you have flat feet, orthotics or special shoes may help. See your caregiver if you need help.  Arch supports, sometimes with wedges on the inner or outer aspect of the heel, can help. These can shift pressure away from the side of the knee most  bothered by osteoarthritis.  A brace called an "unloader" brace also may be used to help ease the pressure on the most arthritic side of the knee.  If your caregiver has prescribed crutches, braces, wraps or ice, use as directed. The acronym for this is PRICE. This means protection, rest, ice, compression, and elevation.  Nonsteroidal anti-inflammatory drugs (NSAIDs), can help relieve pain. But if taken immediately after an injury, they may actually increase swelling. Take NSAIDs with food in your stomach. Stop them if you develop stomach problems. Do not take these if you have a history of ulcers, stomach pain, or bleeding from the bowel. Do not take without your caregiver's approval if you have problems with fluid retention, heart failure, or kidney problems.  For ongoing knee problems, physical therapy may be helpful.  Glucosamine and chondroitin are over-the-counter dietary supplements. Both may  help relieve the pain of osteoarthritis in the knee. These medicines are different from the usual anti-inflammatory drugs. Glucosamine may decrease the rate of cartilage destruction.  Injections of a corticosteroid drug into your knee joint may help reduce the symptoms of an arthritis flare-up. They may provide pain relief that lasts a few months. You may have to wait a few months between injections. The injections do have a small increased risk of infection, water retention, and elevated blood sugar levels.  Hyaluronic acid injected into damaged joints may ease pain and provide lubrication. These injections may work by reducing inflammation. A series of shots may give relief for as long as 6 months.  Topical painkillers. Applying certain ointments to your skin may help relieve the pain and stiffness of osteoarthritis. Ask your pharmacist for suggestions. Many over the-counter products are approved for temporary relief of arthritis pain.  In some countries, doctors often prescribe topical NSAIDs for  relief of chronic conditions such as arthritis and tendinitis. A review of treatment with NSAID creams found that they worked as well as oral medications but without the serious side effects. PREVENTION  Maintain a healthy weight. Extra pounds put more strain on your joints.  Get strong, stay limber. Weak muscles are a common cause of knee injuries. Stretching is important. Include flexibility exercises in your workouts.  Be smart about exercise. If you have osteoarthritis, chronic knee pain or recurring injuries, you may need to change the way you exercise. This does not mean you have to stop being active. If your knees ache after jogging or playing basketball, consider switching to swimming, water aerobics, or other low-impact activities, at least for a few days a week. Sometimes limiting high-impact activities will provide relief.  Make sure your shoes fit well. Choose footwear that is right for your sport.  Protect your knees. Use the proper gear for knee-sensitive activities. Use kneepads when playing volleyball or laying carpet. Buckle your seat belt every time you drive. Most shattered kneecaps occur in car accidents.  Rest when you are tired. SEEK MEDICAL CARE IF:  You have knee pain that is continual and does not seem to be getting better.  SEEK IMMEDIATE MEDICAL CARE IF:  Your knee joint feels hot to the touch and you have a high fever. MAKE SURE YOU:   Understand these instructions.  Will watch your condition.  Will get help right away if you are not doing well or get worse. Document Released: 02/20/2007 Document Revised: 07/18/2011 Document Reviewed: 02/20/2007 [facility] Patient Information 2015 [place], [place]. This information is not intended to replace advice given to you by your health care provider. Make sure you discuss any questions you have with your health care provider.

## 2015-01-16 NOTE — ED Notes (Signed)
Pt  Reports   Shoulder   Pain  For  Quite  A  While         As  Well  As  Pain in the  Affected  l  Knee       denys  Any  Recent injury   Sitting  Upright on the  Exam table  Speaking in  Complete  sentances

## 2015-02-05 ENCOUNTER — Encounter: Payer: Self-pay | Admitting: Internal Medicine

## 2015-02-05 ENCOUNTER — Ambulatory Visit: Payer: Self-pay | Attending: Internal Medicine | Admitting: Internal Medicine

## 2015-02-05 VITALS — BP 139/80 | HR 70 | Temp 98.8°F | Resp 16

## 2015-02-05 DIAGNOSIS — M25562 Pain in left knee: Secondary | ICD-10-CM | POA: Insufficient documentation

## 2015-02-05 DIAGNOSIS — M25511 Pain in right shoulder: Secondary | ICD-10-CM | POA: Insufficient documentation

## 2015-02-05 DIAGNOSIS — Z23 Encounter for immunization: Secondary | ICD-10-CM | POA: Insufficient documentation

## 2015-02-05 MED ORDER — TRAMADOL HCL 50 MG PO TABS: 50.0000 mg | ORAL_TABLET | Freq: Four times a day (QID) | ORAL | Status: DC | PRN

## 2015-02-05 NOTE — Progress Notes (Signed)
Patient here for follow up from her visit at the urgent care Patient was seen for bilateral shoulder pain And prescribed tramadol

## 2015-02-05 NOTE — Patient Instructions (Signed)
Orthopedics will call you soon for a appointment

## 2015-02-05 NOTE — Progress Notes (Signed)
Patient ID: Stephanie Juarez, female   DOB: Aug 17, 1950, 64 y.o.   MRN: [id number]  CC: right shoulder pain, referral  HPI: Stephanie Juarez is a 64 y.o. female here today for a follow up visit.  Patient has past medical history of HTN and arthritis. Patient reports that she was seen in the Urgent care 2 weeks ago for right shoulder and knee pain. She states that she has been taking Meloxicam and Tramadol for pain with only moderate relief. She states that the pain in her shoulder radiates up to her neck and trapezius muscle. The pain is sharp and is exacerbated by reaching above her head.  Patient reports that she is attempting to apply for disability due to the right shoulder pain and plans to use her decreased vision and hearing as supporting evidence. She will be fitted for a left hearing aide soon.    Patient has No headache, No chest pain, No abdominal pain - No Nausea, No new weakness tingling or numbness, No Cough - SOB.  No Known Allergies Past Medical History  Diagnosis Date  . Hypertension   . Arthritis    Current Outpatient Prescriptions on File Prior to Visit  Medication Sig Dispense Refill  . amLODipine (NORVASC) 10 MG tablet Take 1 tablet (10 mg total) by mouth daily. 30 tablet 5  . diclofenac sodium (VOLTAREN) 1 % GEL Apply 1 application topically 4 (four) times daily. 100 g 0  . lurasidone (LATUDA) 40 MG TABS tablet Take 40 mg by mouth daily with breakfast.    . meloxicam (MOBIC) 15 MG tablet Take 1 tablet (15 mg total) by mouth daily. For 5 days, then as needed. 30 tablet 1  . traMADol (ULTRAM) 50 MG tablet Take 1 tablet (50 mg total) by mouth every 6 (six) hours as needed. 15 tablet 0  . OVER THE COUNTER MEDICATION Take 1 tablet by mouth daily as needed (arthritis relief).    . [DISCONTINUED] loratadine (CLARITIN) 10 MG tablet Take 1 tablet (10 mg total) by mouth daily. (Patient not taking: Reported on 06/26/2014) 30 tablet 11   No current facility-administered medications on  file prior to visit.   Family History  Problem Relation Age of Onset  . Arthritis Mother   . Heart disease Mother   . Heart disease Father    Social History   Social History  . Marital Status: Single    Spouse Name: N/A  . Number of Children: N/A  . Years of Education: N/A   Occupational History  . Not on file.   Social History Main Topics  . Smoking status: Former Games developer  . Smokeless tobacco: Not on file  . Alcohol Use: No  . Drug Use: No  . Sexual Activity: Not on file   Other Topics Concern  . Not on file   Social History Narrative    Review of Systems: Other than what is stated in HPI, all other systems are negative.   Objective:   Filed Vitals:   02/05/15 1220  BP: 139/80  Pulse: 70  Temp: 98.8 F (37.1 C)  Resp: 16   Physical Exam  Cardiovascular: Normal rate, regular rhythm and normal heart sounds.   Pulmonary/Chest: Effort normal and breath sounds normal.  Musculoskeletal: She exhibits tenderness (right shoulder joint). She exhibits no edema.  Pain with PROM  Neurological: She is alert.  Skin: Skin is warm and dry.     Lab Results  Component Value Date   WBC 5.3 07/29/2013  HGB 12.8 07/29/2013   HCT 40.9 07/29/2013   MCV 87.4 07/29/2013   PLT 160 07/29/2013   Lab Results  Component Value Date   CREATININE 0.60 07/29/2013   BUN 10 07/29/2013   NA 143 07/29/2013   K 3.7 07/29/2013   CL 104 07/29/2013   CO2 27 07/29/2013    No results found for: HGBA1C Lipid Panel  No results found for: CHOL, TRIG, HDL, CHOLHDL, VLDL, LDLCALC     Assessment and plan:   Abrish was seen today for follow-up.  Diagnoses and all orders for this visit:  Right shoulder pain/Left knee pain -     Ambulatory referral to Orthopedic Surgery -     traMADol (ULTRAM) 50 MG tablet; Take 1 tablet (50 mg total) by mouth every 6 (six) hours as needed.  Need for influenza vaccination -     Flu Vaccine QUAD 36+ mos PF IM (Fluarix & Fluzone Quad PF)   May  follow up as needed or if symptoms do not improve       Ambrose Finland, NP-C [facility] [phone] 02/05/2015, 12:08 PM

## 2015-02-20 ENCOUNTER — Encounter: Payer: Self-pay | Admitting: Sports Medicine

## 2015-02-20 ENCOUNTER — Ambulatory Visit (INDEPENDENT_AMBULATORY_CARE_PROVIDER_SITE_OTHER): Payer: Self-pay | Admitting: Sports Medicine

## 2015-02-20 VITALS — BP 154/138 | Ht 65.0 in | Wt 269.0 lb

## 2015-02-20 DIAGNOSIS — M7501 Adhesive capsulitis of right shoulder: Secondary | ICD-10-CM

## 2015-02-20 DIAGNOSIS — M25562 Pain in left knee: Secondary | ICD-10-CM

## 2015-02-20 DIAGNOSIS — M25511 Pain in right shoulder: Secondary | ICD-10-CM

## 2015-02-20 MED ORDER — METHYLPREDNISOLONE ACETATE 40 MG/ML IJ SUSP
40.0000 mg | Freq: Once | INTRAMUSCULAR | Status: AC
  Administered 2015-02-20: 40 mg via INTRA_ARTICULAR

## 2015-02-20 MED ORDER — DICLOFENAC SODIUM 75 MG PO TBEC: 75.0000 mg | DELAYED_RELEASE_TABLET | Freq: Two times a day (BID) | ORAL | Status: DC

## 2015-02-20 NOTE — Progress Notes (Signed)
Subjective:    Patient ID: Stephanie Juarez, female    DOB: 07/02/1950, 64 y.o.   MRN: [id number]  HPI  64 y/o female who presents with right shoulder and left knee pain.  Her right shoulder pain has been ongoing since January.  She does not recall specific trauma, but does remember falling on it in December or January, in which she fell on her shoulder.  She does not recall it hurting immediately after that time.  She reports that her pain became progressively worse for 4-5 months, then did not become better or worse since that time.  She has significantly decreased ROM as well.  Denies swelling of the joint.  She does report pain in neck, shoulder, and sometimes some radiation down lateral arm to elbow.   In regards to her right knee she states that she began to have pain in September 2015. Most of her pain is located on the lateral joint line and lateral-posterior aspects of her knee.  The pain dull in quality and worse with standing and walking.  She denies any recent trauma or injury to her knee along with any previous injuries. She reports taking tramadol and Mobic without much relief. There was one instance of her knee giving out earlier this year which prompted her to get it re-evaluated.  In April 2016 she received a steroid injection which provide much relief and just started to wear off this month.     Review of Systems MSK: +R shoulder and L knee pain, denies joint swelling Neuro: denies numbness paresthesias    Objective:   Physical Exam  Constitutional: She is oriented to person, place, and time. She appears well-developed and well-nourished.  obese  HENT:  Head: Normocephalic and atraumatic.  Neurological: She is alert and oriented to person, place, and time.  Skin: Skin is warm and dry.  Psychiatric: She has a normal mood and affect. Her behavior is normal. Thought content normal.   MSK: Shoulder- No deformities or swelling present.  No atrophy.     -+TTP along right cervical  paraspinal muscles, right trapezius along right shoulder  -AROM was decreased in all planes, flexion to 80 degrees, abduction to 75 degrees, extension and internal rotation to lateral glute, external rotation restricted  -PROM decreased in all planes, flexion to 100 degrees, abduction to 90 degrees, external and internal rotation restricted  -muscle strength intact bilaterally  -Neurovasc- sensation intact and pulses intact  MSK: L Knee- obese knee, difficult to visualize anatomical structures, limited exam  - TTP along the lateral joint line, lateral patella border - Full AROM and PROM - 5/5 muscle strength bilaterally       Assessment & Plan:  Adhesive capsulitis- Will get x-rays of shoulder.  Will send to PT for increased ROM activities.  Injection of right shoulder performed under ultrasound as directed below.  Sent in voltaren 75 mg BID.   Consent obtained and verified. Time-out conducted. Noted no overlying erythema, induration, or other signs of local infection. Skin prepped in a sterile fashion. Topical analgesic spray: Ethyl chloride. Joint: Right shoulder (intraarticular) Needle: Spinal needle Completed without difficulty. Meds: 7cc lidocaine without epi, 3 cc depomedrol (40mg )  Advised to call if fevers/chills, erythema, induration, drainage, or persistent bleeding.  Knee arthritis- - 4 view x-ray knee standing  - Voltren 75 mg PO BID Consent obtained and verified. Time-out conducted. Noted no overlying erythema, induration, or other signs of local infection. Skin prepped in a sterile fashion. Topical analgesic  spray: Ethyl chloride. Joint:left knee Needle: 22 gauge 1 &1/2 inch Completed without difficulty. Meds: 4 cc lidocaine without epi and 2 cc depomedrol ( )  Advised to call if fevers/chills, erythema, induration, drainage, or persistent bleeding.  *Injections were performed by Dr. Gildardo Cranker, sports medicine fellow.

## 2015-03-03 ENCOUNTER — Ambulatory Visit: Payer: Self-pay | Attending: Sports Medicine | Admitting: Physical Therapy

## 2015-03-03 DIAGNOSIS — M25511 Pain in right shoulder: Secondary | ICD-10-CM | POA: Insufficient documentation

## 2015-03-03 DIAGNOSIS — R293 Abnormal posture: Secondary | ICD-10-CM | POA: Insufficient documentation

## 2015-03-03 DIAGNOSIS — R29898 Other symptoms and signs involving the musculoskeletal system: Secondary | ICD-10-CM | POA: Insufficient documentation

## 2015-03-03 DIAGNOSIS — M25611 Stiffness of right shoulder, not elsewhere classified: Secondary | ICD-10-CM

## 2015-03-03 NOTE — Patient Instructions (Signed)
   Yue Flanigan PT, DPT, LAT, ATC  [facility] Phone: [phone]     

## 2015-03-03 NOTE — Therapy (Signed)
[facility] [address] [place], [place], [address] Phone: [phone]   Fax:  [phone]  Physical Therapy Evaluation  Patient Details  Name: Stephanie Juarez MRN: [id number] Date of Birth: Jan 31, 1951 Referring Provider: Dr. Reino Bellis  Encounter Date: 03/03/2015      PT End of Session - 03/03/15 1013    Visit Number 1   Number of Visits 12   Date for PT Re-Evaluation 04/14/15   PT Start Time 0930   PT Stop Time 1015   PT Time Calculation (min) 45 min   Activity Tolerance Patient tolerated treatment well   Behavior During Therapy [facility] for tasks assessed/performed      Past Medical History  Diagnosis Date  . Hypertension   . Arthritis     Past Surgical History  Procedure Laterality Date  . Shoulder arthroscopy Left 2001    for cyst  . Mandible surgery Left 1981    impacted teeth    There were no vitals filed for this visit.  Visit Diagnosis:  Pain in joint of right shoulder - Plan: PT plan of care cert/re-cert  Weakness of right arm - Plan: PT plan of care cert/re-cert  Decreased ROM of right shoulder - Plan: PT plan of care cert/re-cert  Abnormal posture - Plan: PT plan of care cert/re-cert      Subjective Assessment - 03/03/15 0936    Subjective pt is a 64 y.o F with CC of R shoulder pain that has been going on since before January 2016. since onset she reports the pain has gotten worse and nothing helped until she got an injection on 02/20/2015 and she reported relief since the injection .    Limitations Lifting   How long can you sit comfortably? 1 hour   How long can you stand comfortably? 30 min   How long can you walk comfortably? 30 min   Diagnostic tests no imaging has been taking on the shoulder   Patient Stated Goals To be pain free, and return back to work   Currently in Pain? Yes   Pain Score 2    Pain Location Shoulder   Pain Orientation Right   Pain Descriptors / Indicators  Tightness;Pressure   Pain Type Chronic pain   Pain Onset More than a month ago   Pain Frequency Constant   Aggravating Factors  lowering the arm down, moving    Pain Relieving Factors pain medication            [facility] PT Assessment - 03/03/15 0942    Assessment   Medical Diagnosis R shoulder adhesive capsulitis   Referring Provider Dr. Reino Bellis   Onset Date/Surgical Date --  [place] 2016   Hand Dominance Right   Next MD Visit 03/11/2015   Prior Therapy no   Precautions   Precautions None   Restrictions   Weight Bearing Restrictions No   Balance Screen   Has the patient fallen in the past 6 months No   Has the patient had a decrease in activity level because of a fear of falling?  No   Is the patient reluctant to leave their home because of a fear of falling?  No   Home Environment   Living Environment Private residence   Living Arrangements Other relatives   Type of Home House   Home Access Ramped entrance   Home Layout One level   Prior Function   Level of Independence Independent with basic ADLs;Independent   Vocation  Unemployed   Cognition   Overall Cognitive Status Within Functional Limits for tasks assessed   Observation/Other Assessments   Focus on Therapeutic Outcomes (FOTO)  40% limited  predicted 33% limited   Posture/Postural Control   Posture/Postural Control Postural limitations   Postural Limitations Rounded Shoulders;Forward head   ROM / Strength   AROM / PROM / Strength AROM;PROM;Strength   AROM   AROM Assessment Site Shoulder   Right/Left Shoulder Right;Left   Right Shoulder Extension 35 Degrees  pain in the middle of motion   Right Shoulder Flexion 115 Degrees  pain during movement   Right Shoulder ABduction 110 Degrees  pain during motion and with lowering   Right Shoulder Internal Rotation 61 Degrees   Right Shoulder External Rotation 55 Degrees   Left Shoulder Extension 40 Degrees   Left Shoulder Flexion 140 Degrees   Left Shoulder  ABduction 135 Degrees   Left Shoulder Internal Rotation 66 Degrees   Left Shoulder External Rotation 72 Degrees   PROM   PROM Assessment Site Shoulder   Right/Left Shoulder Right   Right Shoulder Extension 50 Degrees  pain at end range of available motion   Right Shoulder Flexion 158 Degrees  pain at end range of available motion   Right Shoulder ABduction 120 Degrees  pain at end range of available motion   Right Shoulder Internal Rotation 72 Degrees  pain at end range of available motion   Right Shoulder External Rotation 78 Degrees  pain at end range of available motion   Strength   Strength Assessment Site Shoulder;Hand   Right/Left Shoulder Right;Left   Right Shoulder Flexion 3+/5   Right Shoulder Extension 4/5   Right Shoulder ABduction 3+/5   Right Shoulder Internal Rotation 3+/5   Right Shoulder External Rotation 3+/5   Left Shoulder Flexion 4+/5   Left Shoulder Extension 4+/5   Left Shoulder ABduction 4+/5   Left Shoulder Internal Rotation 3+/5   Left Shoulder External Rotation 3+/5   Right Hand Grip (lbs) 41.3  36, 40, 48   Left Hand Grip (lbs) 46#  50,45,43   Palpation   Palpation comment tenderness in the R upper trap due to tightness and pain the middle deltoid and, suprapsinatus and at the coracoid process   Special Tests    Special Tests Rotator Cuff Impingement   Rotator Cuff Impingment tests Hawkins- Kennedy test;Painful Arc of Motion;Empty Can test;Full Can test   Hawkins-Kennedy test   Findings Positive   Side Right   Empty Can test   Findings Positive   Side Right   Comment worse than full can   Full Can test   Findings Positive   Side Right   Painful Arc of Motion   Findings Positive   Side Right                           PT Education - 03/03/15 1012    Education provided Yes   Education Details evaluation findings, POC, goals, HEP, shoulder anatomy   Person(s) Educated Patient   Methods Explanation   Comprehension  Verbalized understanding          PT Short Term Goals - 03/03/15 1043    PT SHORT TERM GOAL #1   Title pt will be I with intial HEP (03/24/2015)   Time 3   Status New   PT SHORT TERM GOAL #2   Title pt will increase R shoulder flexion/abduction by > 10 degrees  with < 4/10 pain to promote functional progression (03/24/2015)   Time 3   Period Weeks   Status New   PT SHORT TERM GOAL #3   Title pt will increase R shoulder strength to atleast 4-/5 to help with ADLs (03/24/2015)   Time 3   Period Weeks   Status New   PT SHORT TERM GOAL #4   Title pt will be able to verbalize and demonstrate techniques to reduce R shoulder pain and reinjury via postural awarness and lifting/carrying mechanics (03/24/2015)   Time 3   Period Weeks   Status New           PT Long Term Goals - 03/03/15 1045    PT LONG TERM GOAL #1   Title pt will be I with all HEP given throughout therapy (04/14/2015)   Time 6   Period Weeks   Status New   PT LONG TERM GOAL #2   Title pt will demonstrate increase R shoulder AROM to [facility] compared bil with < 2/10 pain to assist with ADLS (04/14/2015)   Time 6   Period Weeks   Status New   PT LONG TERM GOAL #3   Title pt will increase R shoulder strength to >4/5 to assist with lifting and carrying activities associated ADLS (04/14/2015)   Time 6   Period Weeks   Status New   PT LONG TERM GOAL #4   Title pt will be able to lift and carrying > 10 # overhead with < 2/10 pain to assist with possible work related acitivities (04/14/2015)   Time 6   Period Weeks   Status New   PT LONG TERM GOAL #5   Title pt will increase her FOTO score to > 67 to demonstrate increased function at discharge (04/14/2015)   Time 6   Period Weeks   Status New               Plan - 03/03/15 1013    Clinical Impression Statement Stephanie Juarez presents to OPPT with CC of R shoulder pain that has been present since January of 2016. She demonstrates limited AROM/PROM of the R shoulder  secondary to pain compared bil. MMT revealed weakness in the R shoulder with pain during testing.  She demonstrate positive testing of impingment indicating  posittive possibly of impingement pathology. She would benefit from physical therapy to decrease pain and return her to her PLOF by addressing the impairments listed.    Pt will benefit from skilled therapeutic intervention in order to improve on the following deficits Pain;Improper body mechanics;Postural dysfunction;Decreased strength;Decreased cognition;Decreased activity tolerance;Decreased endurance;Impaired UE functional use;Hypomobility   Rehab Potential Good   PT Frequency 2x / week   PT Duration 6 weeks   PT Treatment/Interventions ADLs/Self Care Home Management;Cryotherapy;Electrical Stimulation;Iontophoresis 4mg /ml Dexamethasone;Moist Heat;Therapeutic exercise;Therapeutic activities;Ultrasound;Manual techniques;Taping;Dry needling;Passive range of motion;Patient/family education   PT Next Visit Plan assess response to HEP, shoulder mobility/ scapular stability, modalities PRN   PT Home Exercise Plan wand flexion, abduction, shoulder IR/ER and rows.    Consulted and Agree with Plan of Care Patient         Problem List Patient Active Problem List   Diagnosis Date Noted  . Essential hypertension 06/26/2014  . Memory loss 06/26/2014   Stephanie Juarez PT, DPT, LAT, ATC  03/03/2015  11:01 AM      [facility] [address] [place], [place], [address] Phone: [phone]   Fax:  [phone]  Name: Stephanie Juarez  MRN: [id number] Date of Birth: July 28, 1950

## 2015-03-10 ENCOUNTER — Ambulatory Visit: Payer: Self-pay | Attending: Sports Medicine | Admitting: Physical Therapy

## 2015-03-10 DIAGNOSIS — M25511 Pain in right shoulder: Secondary | ICD-10-CM | POA: Insufficient documentation

## 2015-03-10 DIAGNOSIS — R293 Abnormal posture: Secondary | ICD-10-CM | POA: Insufficient documentation

## 2015-03-10 DIAGNOSIS — M25611 Stiffness of right shoulder, not elsewhere classified: Secondary | ICD-10-CM

## 2015-03-10 DIAGNOSIS — R29898 Other symptoms and signs involving the musculoskeletal system: Secondary | ICD-10-CM | POA: Insufficient documentation

## 2015-03-10 NOTE — Therapy (Addendum)
[facility] [address] [place], [place], [address] Phone: [phone]   Fax:  [phone]  Physical Therapy Treatment  Patient Details  Name: Bolivar HawSallie T Kinne MRN: [id number] Date of Birth: 10/04/1950 Referring Provider: Dr. Reino Bellistimothy Draper  Encounter Date: 03/10/2015      PT End of Session - 03/10/15 1124    Visit Number 2   Number of Visits 12   Date for PT Re-Evaluation 04/14/15   PT Start Time 0845   PT Stop Time 0930   PT Time Calculation (min) 45 min      Past Medical History  Diagnosis Date  . Hypertension   . Arthritis     Past Surgical History  Procedure Laterality Date  . Shoulder arthroscopy Left 2001    for cyst  . Mandible surgery Left 1981    impacted teeth    There were no vitals filed for this visit.  Visit Diagnosis:  Pain in joint of right shoulder  Weakness of right arm  Decreased ROM of right shoulder  Abnormal posture      Subjective Assessment - 03/10/15 1042    Subjective "My shoulder hurts but only in certain positions" "The corticosteroid shot really helped"   Pain Score 3    Pain Location Shoulder   Pain Orientation Right   Pain Onset More than a month ago                         [facility]/Exercise - 03/10/15 1114    Shoulder Exercises: Standing   Theraband Level (Shoulder External Rotation) Level 2 (Red)   External Rotation Weight (lbs) 10 reps 2 sets with towel   Theraband Level (Shoulder Internal Rotation) Level 2 (Red)   Internal Rotation Weight (lbs) 10 reps 2 sets with towel   Theraband Level (Shoulder Row) Level 2 (Red)   Row Limitations 10 reps 2 sets   Other Standing Exercises Wand flexion single arm 10 reps; flex/abduction with wand 10 reps with 3 second holds   Other Standing Exercises pulleys flexion and abduction 2 minutes each way   Manual Therapy   Kinesiotex Inhibit Muscle   Kinesiotix   Inhibit Muscle  Rotator cuff  impingement tape                  PT Short Term Goals - 03/03/15 1043    PT SHORT TERM GOAL #1   Title pt will be I with intial HEP (03/24/2015)   Time 3   Status New   PT SHORT TERM GOAL #2   Title pt will increase R shoulder flexion/abduction by > 10 degrees with < 4/10 pain to promote functional progression (03/24/2015)   Time 3   Period Weeks   Status New   PT SHORT TERM GOAL #3   Title pt will increase R shoulder strength to atleast 4-/5 to help with ADLs (03/24/2015)   Time 3   Period Weeks   Status New   PT SHORT TERM GOAL #4   Title pt will be able to verbalize and demonstrate techniques to reduce R shoulder pain and reinjury via postural awarness and lifting/carrying mechanics (03/24/2015)   Time 3   Period Weeks   Status New           PT Long Term Goals - 03/03/15 1045    PT LONG TERM GOAL #1   Title pt will be I with all HEP given throughout therapy (  04/14/2015)   Time 6   Period Weeks   Status New   PT LONG TERM GOAL #2   Title pt will demonstrate increase R shoulder AROM to [facility] compared bil with < 2/10 pain to assist with ADLS (04/14/2015)   Time 6   Period Weeks   Status New   PT LONG TERM GOAL #3   Title pt will increase R shoulder strength to >4/5 to assist with lifting and carrying activities associated ADLS (04/14/2015)   Time 6   Period Weeks   Status New   PT LONG TERM GOAL #4   Title pt will be able to lift and carrying > 10 # overhead with < 2/10 pain to assist with possible work related acitivities (04/14/2015)   Time 6   Period Weeks   Status New   PT LONG TERM GOAL #5   Title pt will increase her FOTO score to > 67 to demonstrate increased function at discharge (04/14/2015)   Time 6   Period Weeks   Status New               Plan - 03/10/15 1126    Clinical Impression Statement Patient presents with pain in flexion and internal rotation. We went over her HEP and removed the wand flexion single arm for the moment because  it causes pain. Also, the elastic band rows were changed from 90 degree abd rows to standard rows. We performed kenisodex taping for shoulder impingement pain relief.   PT Next Visit Plan assess response to HEP changes, taping and progress exercises. [place] on anterior deltoid     During this treatment session, the therapist was present, participating in and directing the treatment.   Problem List Patient Active Problem List   Diagnosis Date Noted  . Essential hypertension 06/26/2014  . Memory loss 06/26/2014   Kenney Houseman, SPTA 03/10/2015 2:31 PM PHONE:[phone] FAX:[phone]  Jannette Spanner, PTA 03/10/2015 2:31 PM Phone: [phone] Fax: [phone]  [facility] [address] [place], [place], [address] Phone: [phone]   Fax:  [phone]  Name: KIARRAH RAUSCH MRN: [id number] Date of Birth: 10-Jun-1950

## 2015-03-11 ENCOUNTER — Ambulatory Visit (INDEPENDENT_AMBULATORY_CARE_PROVIDER_SITE_OTHER): Payer: Self-pay | Admitting: Family Medicine

## 2015-03-11 ENCOUNTER — Encounter: Payer: Self-pay | Admitting: Family Medicine

## 2015-03-11 VITALS — BP 140/52 | HR 83 | Ht 65.0 in | Wt 249.0 lb

## 2015-03-11 DIAGNOSIS — M7501 Adhesive capsulitis of right shoulder: Secondary | ICD-10-CM | POA: Insufficient documentation

## 2015-03-11 NOTE — Assessment & Plan Note (Signed)
Great improvement already after steroid injection intra-articularly as well as 2 sessions of physical therapy. -Continue physical therapy for at least 3 months -Follow-up at that time to see how she is doing and whether continuing is an option versus possible surgical intervention which is a last resort. Continue voltaren 75 mg BID.

## 2015-03-11 NOTE — Progress Notes (Signed)
   Subjective:    Patient ID: Stephanie Juarez, female    DOB: 04/14/1951, 64 y.o.   MRN: [id number]  HPI  64 y/o female who presents with right shoulder and left knee pain.   Visit 02/20/15 - Her right shoulder pain has been ongoing since January.  She does not recall specific trauma, but does remember falling on it in December or January, in which she fell on her shoulder.  She does not recall it hurting immediately after that time.  She reports that her pain became progressively worse for 4-5 months, then did not become better or worse since that time.  She has significantly decreased ROM as well.  Denies swelling of the joint.  She does report pain in neck, shoulder, and sometimes some radiation down lateral arm to elbow.    Visit 03/11/15 - patient returns for follow-up today status post injection intra-articular using 9 mL of volume into her right shoulder. She is much improved after this and 1-2 sessions of physical therapy working on gentle range of motion. She is 52 to 60% better per her report. No nighttime awakenings or changes in other symptoms.   Review of Systems MSK: +R shoulder and L knee pain, denies joint swelling Neuro: denies numbness paresthesias    Objective:   Physical Exam  Constitutional: She is oriented to person, place, and time. She appears well-developed and well-nourished.  obese  HENT:  Head: Normocephalic and atraumatic.  Neurological: She is alert and oriented to person, place, and time.  Skin: Skin is warm and dry.  Psychiatric: She has a normal mood and affect. Her behavior is normal. Thought content normal.   MSK: Shoulder- No deformities or swelling present.  No atrophy.     -+TTP along right cervical paraspinal muscles, right trapezius along right shoulder  -AROM was decreased in all planes, flexion to 100 degrees, abduction to 120 degrees, extension and internal rotation to lateral glute, external rotation restricted  -PROM decreased in all planes,  flexion to 140 degrees, abduction to 130 degrees, external and internal rotation restricted  -muscle strength intact bilaterally  -Neurovasc- sensation intact and pulses intact

## 2015-03-13 ENCOUNTER — Ambulatory Visit: Payer: Self-pay | Admitting: Physical Therapy

## 2015-03-13 DIAGNOSIS — R29898 Other symptoms and signs involving the musculoskeletal system: Secondary | ICD-10-CM

## 2015-03-13 DIAGNOSIS — M25511 Pain in right shoulder: Secondary | ICD-10-CM

## 2015-03-13 DIAGNOSIS — M25611 Stiffness of right shoulder, not elsewhere classified: Secondary | ICD-10-CM

## 2015-03-13 DIAGNOSIS — R293 Abnormal posture: Secondary | ICD-10-CM

## 2015-03-13 NOTE — Therapy (Addendum)
[facility] [address] [place], [place], [address] Phone: [phone]   Fax:  [phone]  Physical Therapy Treatment  Patient Details  Name: Stephanie HawSallie T Juarez MRN: [id number] Date of Birth: 09/29/1950 Referring Provider: Dr. Reino Bellistimothy Draper  Encounter Date: 03/13/2015      PT End of Session - 03/13/15 1057    Visit Number 3   Number of Visits 12   Date for PT Re-Evaluation 04/14/15   PT Start Time 0845   PT Stop Time 0930   PT Time Calculation (min) 45 min   Activity Tolerance Patient tolerated treatment well   Behavior During Therapy [facility] for tasks assessed/performed      Past Medical History  Diagnosis Date  . Hypertension   . Arthritis     Past Surgical History  Procedure Laterality Date  . Shoulder arthroscopy Left 2001    for cyst  . Mandible surgery Left 1981    impacted teeth    There were no vitals filed for this visit.  Visit Diagnosis:  Pain in joint of right shoulder  Weakness of right arm  Decreased ROM of right shoulder  Abnormal posture      Subjective Assessment - 03/13/15 0855    Subjective I think the tape helped   Currently in Pain? Yes   Pain Score 2    Aggravating Factors  flexion and horizontal adduction   Pain Relieving Factors tape                         [facility] Adult PT Treatment/Exercise - 03/13/15 0001    Shoulder Exercises: Supine   Other Supine Exercises serratus punches 3#  20 reps; circles AROM 20 reps   Other Supine Exercises Inferior Shoulder mobs grade 3 3 minutes Right side   Shoulder Exercises: Standing   Theraband Level (Shoulder External Rotation) Level 2 (Red)   External Rotation Weight (lbs) 10 reps 2 sets with towel   Theraband Level (Shoulder Internal Rotation) Level 2 (Red)   Internal Rotation Weight (lbs) 10 reps 2 sets with towel   Extension Strengthening;10 reps;Theraband   Theraband Level (Shoulder Extension) Level 2 (Red)   Extension  Limitations 2 sets   Theraband Level (Shoulder Row) Level 2 (Red)   Row Limitations 10 reps 2 sets   Other Standing Exercises pulleys flexion and abduction 2 minutes each way; UE ranger flexion horizontal abduction 3 minutes each plane; Shoulder stabilization 2 minutes    Modalities   Modalities Iontophoresis;Ultrasound   Ultrasound   Ultrasound Location Right posterior deltoid   Ultrasound Parameters continuous; 1.2Wcm2; 8 minutes   Ultrasound Goals Pain   Iontophoresis   Type of Iontophoresis Dexamethasone   Location posterior R deltoid   Dose 1.0cc   Time 6 hours                  PT Short Term Goals - 03/03/15 1043    PT SHORT TERM GOAL #1   Title pt will be I with intial HEP (03/24/2015)   Time 3   Status New   PT SHORT TERM GOAL #2   Title pt will increase R shoulder flexion/abduction by > 10 degrees with < 4/10 pain to promote functional progression (03/24/2015)   Time 3   Period Weeks   Status New   PT SHORT TERM GOAL #3   Title pt will increase R shoulder strength to atleast 4-/5 to help with ADLs (03/24/2015)   Time  3   Period Weeks   Status New   PT SHORT TERM GOAL #4   Title pt will be able to verbalize and demonstrate techniques to reduce R shoulder pain and reinjury via postural awarness and lifting/carrying mechanics (03/24/2015)   Time 3   Period Weeks   Status New           PT Long Term Goals - 03/03/15 1045    PT LONG TERM GOAL #1   Title pt will be I with all HEP given throughout therapy (04/14/2015)   Time 6   Period Weeks   Status New   PT LONG TERM GOAL #2   Title pt will demonstrate increase R shoulder AROM to [facility] compared bil with < 2/10 pain to assist with ADLS (04/14/2015)   Time 6   Period Weeks   Status New   PT LONG TERM GOAL #3   Title pt will increase R shoulder strength to >4/5 to assist with lifting and carrying activities associated ADLS (04/14/2015)   Time 6   Period Weeks   Status New   PT LONG TERM GOAL #4   Title  pt will be able to lift and carrying > 10 # overhead with < 2/10 pain to assist with possible work related acitivities (04/14/2015)   Time 6   Period Weeks   Status New   PT LONG TERM GOAL #5   Title pt will increase her FOTO score to > 67 to demonstrate increased function at discharge (04/14/2015)   Time 6   Period Weeks   Status New               Plan - 03/13/15 1058    Clinical Impression Statement Patient presents with pain in the posterior deltoid. Treatment was focused on increasing ROM of the right shoulder and strengthening. She liked the taping but agreed to try iontophoresis and ultrasound to relieve the pain in this session.   PT Next Visit Plan assess response to HEP changes, iontophoresis and [place] and progress strengthening try UBE? CHECK GOALS MEASURE     During this treatment session, the therapist was present, participating in and directing the treatment.   Problem List Patient Active Problem List   Diagnosis Date Noted  . 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 Essential hypertension 06/26/2014  . Memory loss 06/26/2014   Kenney Houseman, SPTA 03/13/2015 12:04 PM [phone] [phone]  Jannette Spanner, PTA 03/13/2015 12:05 PM Phone: [phone] Fax: [phone]  [facility] [address] [place], [place], [address] Phone: [phone]   Fax:  [phone]  Name: Stephanie Juarez MRN: [id number] Date of Birth: May 05, 1951

## 2015-03-16 ENCOUNTER — Ambulatory Visit: Payer: Self-pay | Admitting: Physical Therapy

## 2015-03-16 DIAGNOSIS — M25611 Stiffness of right shoulder, not elsewhere classified: Secondary | ICD-10-CM

## 2015-03-16 DIAGNOSIS — R29898 Other symptoms and signs involving the musculoskeletal system: Secondary | ICD-10-CM

## 2015-03-16 DIAGNOSIS — M25511 Pain in right shoulder: Secondary | ICD-10-CM

## 2015-03-16 DIAGNOSIS — R293 Abnormal posture: Secondary | ICD-10-CM

## 2015-03-16 NOTE — Therapy (Signed)
[facility] [place], [place], [address] Phone: [phone]   Fax:  [phone]  Physical Therapy Treatment  Patient Details  Name: Stephanie Juarez MRN: [id number] Date of Birth: [birth year] Referring Provider: Dr. Lilia Argue  Encounter Date: 03/16/2015      PT End of Session - 03/16/15 1439    Visit Number 4   Number of Visits 12   Date for PT Re-Evaluation 04/14/15   PT Start Time 2297   PT Stop Time 1505   PT Time Calculation (min) 48 min      Past Medical History  Diagnosis Date  . Hypertension   . Arthritis     Past Surgical History  Procedure Laterality Date  . Shoulder arthroscopy Left 2001    for cyst  . Mandible surgery Left 1981    impacted teeth    There were no vitals filed for this visit.  Visit Diagnosis:  No diagnosis found.      Subjective Assessment - 03/16/15 1423    Subjective I liked the patch helped.            [facility] PT Assessment - 03/16/15 1520    AROM   Right Shoulder Flexion 117 Degrees   Right Shoulder ABduction 119 Degrees   Strength   Right Shoulder Flexion 3+/5   Right Shoulder ABduction 4-/5                     [facility] Adult PT Treatment/Exercise - 03/16/15 1521    Shoulder Exercises: Seated   Other Seated Exercises UR ranger flexion/ horizontal abduction and adduction x 5 min   Shoulder Exercises: Standing   Theraband Level (Shoulder External Rotation) Level 3 (Green)   External Rotation Weight (lbs) 10 reps 2 sets with towel   Theraband Level (Shoulder Internal Rotation) Level 2 (Red)   Internal Rotation Weight (lbs) 10 reps 2 sets with towel   Extension Strengthening;10 reps;Theraband   Theraband Level (Shoulder Extension) Level 3 (Green)   Extension Limitations 2 sets 10 reps   Theraband Level (Shoulder Row) Level 3 (Green)   Row Limitations 10 reps 2 sets   Other Standing Exercises wall flexion with physioball 2 sets 15 reps;  horizontal abduction and adduction 2 sets 20 reps; bodyblade in flexion and abduction x 2 minutes each   Modalities   Modalities Iontophoresis;Ultrasound   Ultrasound   Ultrasound Location anterior deltoid (Right)   Ultrasound Parameters continuous/ 8 minutes/ 1.2Wcm2/    Ultrasound Goals Pain   Iontophoresis   Type of Iontophoresis Dexamethasone   Location anterior deltoid   Dose 1.0cc   Time 6 hours                  PT Short Term Goals - 03/16/15 1516    PT SHORT TERM GOAL #1   Title pt will be I with intial HEP (03/24/2015)   Time 3   Status On-going   PT SHORT TERM GOAL #2   Title pt will increase R shoulder flexion/abduction by > 10 degrees with < 4/10 pain to promote functional progression (03/24/2015)   Time 3   Period Weeks   Status On-going   PT SHORT TERM GOAL #3   Title pt will increase R shoulder strength to atleast 4-/5 to help with ADLs (03/24/2015)   Time 3   Status Partially Met  met in abduction   PT SHORT TERM GOAL #4   Title pt will be able  to verbalize and demonstrate techniques to reduce R shoulder pain and reinjury via postural awarness and lifting/carrying mechanics (03/24/2015)   Time 3   Period Weeks   Status On-going           PT Long Term Goals - 03/03/15 1045    PT LONG TERM GOAL #1   Title pt will be I with all HEP given throughout therapy (04/14/2015)   Time 6   Period Weeks   Status New   PT LONG TERM GOAL #2   Title pt will demonstrate increase R shoulder AROM to [facility] compared bil with < 2/10 pain to assist with ADLS (37/07/5787)   Time 6   Period Weeks   Status New   PT LONG TERM GOAL #3   Title pt will increase R shoulder strength to >4/5 to assist with lifting and carrying activities associated ADLS (78/08/7839)   Time 6   Period Weeks   Status New   PT LONG TERM GOAL #4   Title pt will be able to lift and carrying > 10 # overhead with < 2/10 pain to assist with possible work related acitivities (04/14/2015)   Time 6    Period Weeks   Status New   PT LONG TERM GOAL #5   Title pt will increase her FOTO score to > 67 to demonstrate increased function at discharge (04/14/2015)   Time 6   Period Weeks   Status New               Plan - 03/16/15 1439    Clinical Impression Statement Patient presented with 6/10 pain with active flexion and abduction. Treatment focused on strengthening the Right shoulder, shoulder stabilizers, increasing ROM, and decreasing pain in the shoulder joint. Resistance was progressed to [place] TB and the body blade was initiated for the first time with some difficulty in return demonstration. Goals were assessed with AROM and strength  progressing onliy slightly in Abduction and Flexion of the right shoulder (see flow sheet) Patient prefered iontophoresis and [place] to taping.   PT Next Visit Plan try impingement taping, assess response to new location of iontophoresis patch and [place], progress strengthening        Problem List Patient Active Problem List   Diagnosis Date Noted  . 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 Essential hypertension 06/26/2014  . Memory loss 06/26/2014   Laury Axon, [place] 03/16/2015 3:28 PM PHONE:[phone] FAX:[phone]  [facility] [address] [place], [place], [address] Phone: [phone]   Fax:  [phone]  Name: HADAS JESSOP MRN: [id number] Date of Birth: July 05, 1950

## 2015-03-18 ENCOUNTER — Ambulatory Visit: Payer: Self-pay | Admitting: Physical Therapy

## 2015-03-18 DIAGNOSIS — R293 Abnormal posture: Secondary | ICD-10-CM

## 2015-03-18 DIAGNOSIS — R29898 Other symptoms and signs involving the musculoskeletal system: Secondary | ICD-10-CM

## 2015-03-18 DIAGNOSIS — M25511 Pain in right shoulder: Secondary | ICD-10-CM

## 2015-03-18 DIAGNOSIS — M25611 Stiffness of right shoulder, not elsewhere classified: Secondary | ICD-10-CM

## 2015-03-18 NOTE — Therapy (Signed)
[place], [place], [address] Phone: [phone]   Fax:  [phone]  Physical Therapy Treatment  Patient Details  Name: Stephanie Juarez MRN: [id number] Date of Birth: 11-Jun-1950 Referring Provider: Dr. Lilia Argue  Encounter Date: 03/18/2015      PT End of Session - 03/18/15 1337    Visit Number 5   Number of Visits 12   Date for PT Re-Evaluation 04/14/15   PT Start Time 0132   PT Stop Time 0212   PT Time Calculation (min) 40 min      Past Medical History  Diagnosis Date  . Hypertension   . Arthritis     Past Surgical History  Procedure Laterality Date  . Shoulder arthroscopy Left 2001    for cyst  . Mandible surgery Left 1981    impacted teeth    There were no vitals filed for this visit.  Visit Diagnosis:  Pain in joint of right shoulder  Weakness of right arm  Decreased ROM of right shoulder  Abnormal posture      Subjective Assessment - 03/18/15 1335    Subjective My shoulder is not hurting today.    Currently in Pain? No/denies            [facility] PT Assessment - 03/18/15 1340    AROM   Right Shoulder Flexion 120 Degrees   Right Shoulder ABduction 118 Degrees  pain                     [facility] Adult PT Treatment/Exercise - 03/18/15 1345    Shoulder Exercises: Supine   External Rotation Both;15 reps;Theraband   Theraband Level (Shoulder External Rotation) Level 2 (Red)   Flexion AROM;Strengthening;Right;20 reps;Theraband   Theraband Level (Shoulder Flexion) Level 2 (Red)   Other Supine Exercises serratus punches 3#  20 reps; circles AROM 20 reps   Shoulder Exercises: Seated   Other Seated Exercises UE ranger flexion/ horizontal abduction and adduction x 5 min   Shoulder Exercises: Sidelying   External Rotation 20 reps   External Rotation Weight (lbs) 1,2  10 reps each   ABduction Left;20 reps;Weights   ABduction Weight (lbs) 1   Shoulder Exercises:  Standing   Extension Strengthening;10 reps;Theraband   Theraband Level (Shoulder Extension) Level 3 (Green)   Extension Limitations 2 sets   Theraband Level (Shoulder Row) Level 3 (Green)   Other Standing Exercises pulleys flexion and abduction 2 minutes each way   Iontophoresis   Type of Iontophoresis Dexamethasone   Location RTC insertion   Dose 1.0cc   Time 6 hours                  PT Short Term Goals - 03/18/15 1428    PT SHORT TERM GOAL #1   Title pt will be I with intial HEP (03/24/2015)   Time 3   Period Weeks   Status Achieved   PT SHORT TERM GOAL #2   Title pt will increase R shoulder flexion/abduction by > 10 degrees with < 4/10 pain to promote functional progression (03/24/2015)   Baseline pain with abduction   Time 3   Period Weeks   Status Partially Met   PT SHORT TERM GOAL #3   Title pt will increase R shoulder strength to atleast 4-/5 to help with ADLs (03/24/2015)   Time 3   Period Weeks   Status Partially Met   PT SHORT TERM GOAL #4   Title  pt will be able to verbalize and demonstrate techniques to reduce R shoulder pain and reinjury via postural awarness and lifting/carrying mechanics (03/24/2015)   Time 3   Period Weeks   Status On-going           PT Long Term Goals - 03/18/15 1431    PT LONG TERM GOAL #1   Title pt will be I with all HEP given throughout therapy (04/14/2015)   Time 6   Period Weeks   Status On-going   PT LONG TERM GOAL #2   Title pt will demonstrate increase R shoulder AROM to [facility] compared bil with < 2/10 pain to assist with ADLS (30/12/6576)   Time 6   Period Weeks   Status On-going   PT LONG TERM GOAL #3   Title pt will increase R shoulder strength to >4/5 to assist with lifting and carrying activities associated ADLS (46/01/6294)   Time 6   Period Weeks   Status On-going   PT LONG TERM GOAL #4   Title pt will be able to lift and carrying > 10 # overhead with < 2/10 pain to assist with possible work related  acitivities (04/14/2015)   Time 6   Period Weeks   Status On-going   PT LONG TERM GOAL #5   Title pt will increase her FOTO score to > 67 to demonstrate increased function at discharge (04/14/2015)   Time 6   Period Weeks   Status Unable to assess               Plan - 03/18/15 1429    Clinical Impression Statement Pt presents with no pain today. Her AROM has improved in flexion and abduction, pain with abduction. Instructed in supine strengtheing with weights. Ionto patch to RTC insertion. Progressing toward goals, STG #1 MET, #2,#3 partially MET.    PT Next Visit Plan Update HEP to include supine/sidelying strengthening, continue ionto        Problem List Patient Active Problem List   Diagnosis Date Noted  . 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 Essential hypertension 06/26/2014  . Memory loss 06/26/2014    Dorene Ar, PTA 03/18/2015, 2:32 PM  [facility] [address] [place], [place], [address] Phone: [phone]   Fax:  [phone]  Name: Stephanie Juarez MRN: [id number] Date of Birth: 1950/09/23

## 2015-03-23 ENCOUNTER — Ambulatory Visit: Payer: Self-pay | Admitting: Physical Therapy

## 2015-03-23 DIAGNOSIS — M25511 Pain in right shoulder: Secondary | ICD-10-CM

## 2015-03-23 DIAGNOSIS — R29898 Other symptoms and signs involving the musculoskeletal system: Secondary | ICD-10-CM

## 2015-03-23 DIAGNOSIS — R293 Abnormal posture: Secondary | ICD-10-CM

## 2015-03-23 DIAGNOSIS — M25611 Stiffness of right shoulder, not elsewhere classified: Secondary | ICD-10-CM

## 2015-03-23 NOTE — Therapy (Signed)
[place], [place], [address] Phone: [phone]   Fax:  [phone]  Physical Therapy Treatment  Patient Details  Name: Stephanie Juarez MRN: [id number] Date of Birth: Jan 23, 1951 Referring Provider: Dr. Lilia Argue  Encounter Date: 03/23/2015      PT End of Session - 03/23/15 1428    Visit Number 6   Number of Visits 12   Date for PT Re-Evaluation 04/14/15   PT Start Time 1330   PT Stop Time 1417   PT Time Calculation (min) 47 min   Activity Tolerance Patient tolerated treatment well   Behavior During Therapy [facility] for tasks assessed/performed      Past Medical History  Diagnosis Date  . Hypertension   . Arthritis     Past Surgical History  Procedure Laterality Date  . Shoulder arthroscopy Left 2001    for cyst  . Mandible surgery Left 1981    impacted teeth    There were no vitals filed for this visit.  Visit Diagnosis:  Pain in joint of right shoulder  Weakness of right arm  Decreased ROM of right shoulder  Abnormal posture      Subjective Assessment - 03/23/15 1333    Subjective pt reports that she has been consistent with her HEP, and that her pain is much better but still has some residual pain in the shoulder.    Currently in Pain? Yes   Pain Score 4    Pain Location Shoulder   Pain Orientation Right   Pain Descriptors / Indicators Sore   Pain Type Chronic pain   Pain Onset More than a month ago   Pain Frequency Intermittent   Aggravating Factors  flexion and abduction   Pain Relieving Factors tapeing,             [facility] PT Assessment - 03/23/15 0001    AROM   Right Shoulder Flexion 130 Degrees   Right Shoulder ABduction 118 Degrees                     [facility] Adult PT Treatment/Exercise - 03/23/15 1338    Shoulder Exercises: Supine   Horizontal ABduction AROM;Strengthening;Both;15 reps;Theraband   Theraband Level (Shoulder Horizontal ABduction)  Level 2 (Red)   External Rotation Both;15 reps;Theraband  with scapular retraction 2 sets   Theraband Level (Shoulder External Rotation) Level 2 (Red)   Other Supine Exercises serratus punches 5# weight around bar x 15   Other Supine Exercises PNF D2 2 x 15 with red theraband   Shoulder Exercises: Standing   Theraband Level (Shoulder External Rotation) Level 4 (Blue)   External Rotation Weight (lbs) 10 reps 2 sets with towel   Theraband Level (Shoulder Internal Rotation) Level 4 (Blue)   Internal Rotation Weight (lbs) 10 reps 2 sets with towel   Extension Strengthening;Theraband;10 reps   Theraband Level (Shoulder Extension) Level 4 (Blue)   Row AROM;Strengthening;15 reps   Theraband Level (Shoulder Row) Level 4 (Blue)   Other Standing Exercises bicep curls x10 with 4#, x 10 with 5#   Other Standing Exercises --   Shoulder Exercises: ROM/Strengthening   UBE (Upper Arm Bike) L1 x 6 min  alternating direction every 3 minutes   Shoulder Exercises: Body Blade   Other Body Blade Exercises IR/ER 2x 15   pt exhibited dificulty getting body blade going   Other Body Blade Exercises elbow flexion/ extension 2 x 15 sec   Iontophoresis   Type  of Iontophoresis Dexamethasone   Location RTC insertion   Dose 1.0cc   Time 6 hours   Manual Therapy   Manual Therapy Joint mobilization   Joint Mobilization scapular grade 2-3 in all planes with upward/down ward rotation                 PT Education - 03/23/15 1428    Education provided Yes   Education Details Updated HEP   Person(s) Educated Patient   Methods Explanation   Comprehension Verbalized understanding          PT Short Term Goals - 03/18/15 1428    PT SHORT TERM GOAL #1   Title pt will be I with intial HEP (03/24/2015)   Time 3   Period Weeks   Status Achieved   PT SHORT TERM GOAL #2   Title pt will increase R shoulder flexion/abduction by > 10 degrees with < 4/10 pain to promote functional progression (03/24/2015)    Baseline pain with abduction   Time 3   Period Weeks   Status Partially Met   PT SHORT TERM GOAL #3   Title pt will increase R shoulder strength to atleast 4-/5 to help with ADLs (03/24/2015)   Time 3   Period Weeks   Status Partially Met   PT SHORT TERM GOAL #4   Title pt will be able to verbalize and demonstrate techniques to reduce R shoulder pain and reinjury via postural awarness and lifting/carrying mechanics (03/24/2015)   Time 3   Period Weeks   Status On-going           PT Long Term Goals - 03/18/15 1431    PT LONG TERM GOAL #1   Title pt will be I with all HEP given throughout therapy (04/14/2015)   Time 6   Period Weeks   Status On-going   PT LONG TERM GOAL #2   Title pt will demonstrate increase R shoulder AROM to [facility] compared bil with < 2/10 pain to assist with ADLS (20/06/5425)   Time 6   Period Weeks   Status On-going   PT LONG TERM GOAL #3   Title pt will increase R shoulder strength to >4/5 to assist with lifting and carrying activities associated ADLS (10/08/3760)   Time 6   Period Weeks   Status On-going   PT LONG TERM GOAL #4   Title pt will be able to lift and carrying > 10 # overhead with < 2/10 pain to assist with possible work related acitivities (04/14/2015)   Time 6   Period Weeks   Status On-going   PT LONG TERM GOAL #5   Title pt will increase her FOTO score to > 67 to demonstrate increased function at discharge (04/14/2015)   Time 6   Period Weeks   Status Unable to assess               Plan - 03/23/15 1429    Clinical Impression Statement Stephanie Juarez conitnues to make progress with decreased pain and improved mobility. She was able to get 130 degrees of AROM flexion with only pain during lowering phase of flexion. She was able to do all exercsies except for having trouble coordinating for the body blade and reported pain inthe anterior aspect of the shoulder that was fleeting after exercise ceased.   PT Next Visit Plan  strengthening,  continue ionto        Problem List Patient Active Problem List   Diagnosis Date Noted  .  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 Essential hypertension 06/26/2014  . Memory loss 06/26/2014   Starr Lake PT, DPT, LAT, ATC  03/23/2015  5:01 PM     [facility] [address] [place], [place], [address] Phone: [phone]   Fax:  [phone]  Name: ALBA PERILLO MRN: [id number] Date of Birth: 07/20/1950

## 2015-03-23 NOTE — Patient Instructions (Signed)
   Nazaire Cordial PT, DPT, LAT, ATC  [facility] Phone: [phone]     

## 2015-03-25 ENCOUNTER — Ambulatory Visit: Payer: Self-pay | Admitting: Physical Therapy

## 2015-03-25 ENCOUNTER — Telehealth: Payer: Self-pay | Admitting: Internal Medicine

## 2015-03-25 DIAGNOSIS — M25611 Stiffness of right shoulder, not elsewhere classified: Secondary | ICD-10-CM

## 2015-03-25 DIAGNOSIS — M25511 Pain in right shoulder: Secondary | ICD-10-CM

## 2015-03-25 DIAGNOSIS — R29898 Other symptoms and signs involving the musculoskeletal system: Secondary | ICD-10-CM

## 2015-03-25 DIAGNOSIS — I10 Essential (primary) hypertension: Secondary | ICD-10-CM

## 2015-03-25 NOTE — Therapy (Signed)
[place], [place], [address] Phone: [phone]   Fax:  [phone]  Physical Therapy Treatment  Patient Details  Name: Stephanie Juarez MRN: [id number] Date of Birth: June 28, 1950 Referring Provider: Dr. Lilia Argue  Encounter Date: 03/25/2015      PT End of Session - 03/25/15 1333    Visit Number 7   Number of Visits 12   Date for PT Re-Evaluation 04/14/15   PT Start Time 1330   PT Stop Time 1430   PT Time Calculation (min) 60 min   Activity Tolerance Patient tolerated treatment well   Behavior During Therapy [facility] for tasks assessed/performed      Past Medical History  Diagnosis Date  . Hypertension   . Arthritis     Past Surgical History  Procedure Laterality Date  . Shoulder arthroscopy Left 2001    for cyst  . Mandible surgery Left 1981    impacted teeth    There were no vitals filed for this visit.  Visit Diagnosis:  Pain in joint of right shoulder  Weakness of right arm  Decreased ROM of right shoulder      Subjective Assessment - 03/25/15 1330    Subjective "My pain is more hurting at night" "I can't sleep  on my right side anymore"   Pain Score 4    Pain Location Shoulder   Pain Orientation Right   Pain Descriptors / Indicators Sore   Pain Type Chronic pain   Pain Onset More than a month ago   Pain Frequency Intermittent   Aggravating Factors  flexion and abduction   Pain Relieving Factors medication, avoiding flexion positions            [facility] PT Assessment - 03/25/15 1338    Strength   Right Shoulder Flexion 3+/5   Right Shoulder ABduction 4/5                     [facility] Adult PT Treatment/Exercise - 03/25/15 1436    Shoulder Exercises: Supine   Horizontal ABduction AROM;Strengthening;Both;2X15 reps;Theraband   Theraband Level (Shoulder Horizontal ABduction) Level 2 (Red) with physioball against wall            Other Supine Exercises serratus  punches 5#   Other Supine Exercises PNF D2 2sets x 15reps with red theraband   Shoulder Exercises: Standing   Other Seated Exercises UE ranger flexion/ horizontal abduction and adduction x 69mn min with 1# cuff switching movement every 3 minutes      Therapy ball physioball wall flexion 10 reps               Extension Strengthening;Theraband;2sets 15 reps   Theraband Level (Shoulder Extension) Level 4 (Blue)   Row AROM;Strengthening; 2 sets 15 reps   Theraband Level (Shoulder Row) Level 4 (Blue)   Other Standing Exercises bicep curls x15 with 4#, x 15 with 5#   Shoulder Exercises: ROM/Strengthening   UBE (Upper Arm Bike) L1 x 7 min  alternating direction every 3.5 minutes   Iontophoresis   Type of Iontophoresis Dexamethasone   Location RTC insertion   Dose 1.0cc   Time 6 hours        Cryotherapy  Ice pack; Right shoulder; 15 minutes pain relief              PT Short Term Goals - 03/18/15 1428    PT SHORT TERM GOAL #1   Title pt will be I with intial HEP (03/24/2015)   Time 3   Period Weeks   Status Achieved   PT SHORT TERM GOAL #2   Title pt will increase R shoulder flexion/abduction by > 10 degrees with < 4/10 pain to promote functional progression (03/24/2015)   Baseline pain with abduction   Time 3   Period Weeks   Status Partially Met   PT SHORT TERM GOAL #3   Title pt will increase R shoulder strength to atleast 4-/5 to help with ADLs (03/24/2015)   Time 3   Period Weeks   Status Partially Met   PT SHORT TERM GOAL #4   Title pt will be able to verbalize and demonstrate techniques to reduce R shoulder pain and reinjury via postural awarness and lifting/carrying mechanics (03/24/2015)   Time 3   Period Weeks   Status On-going           PT Long Term Goals - 03/18/15 1431    PT LONG TERM GOAL #1   Title pt will be I with all HEP given throughout therapy (04/14/2015)   Time 6   Period  Weeks   Status On-going   PT LONG TERM GOAL #2   Title pt will demonstrate increase R shoulder AROM to [facility] compared bil with < 2/10 pain to assist with ADLS (59/11/4161)   Time 6   Period Weeks   Status On-going   PT LONG TERM GOAL #3   Title pt will increase R shoulder strength to >4/5 to assist with lifting and carrying activities associated ADLS (84/09/3644)   Time 6   Period Weeks   Status On-going   PT LONG TERM GOAL #4   Title pt will be able to lift and carrying > 10 # overhead with < 2/10 pain to assist with possible work related acitivities (04/14/2015)   Time 6   Period Weeks   Status On-going   PT LONG TERM GOAL #5   Title pt will increase her FOTO score to > 67 to demonstrate increased function at discharge (04/14/2015)   Time 6   Period Weeks   Status Unable to assess               Plan - 03/25/15 1417    Clinical Impression Statement Patient is making improvement with pain in that she is only having intermittent pain now, whereas before it was constant. Her strength is improving in abduction and has improved to R shoulder abduction 4/5, but flexion is still remaining the same as before. I progressed to using weight with flexion on the UE ranger as well as increasing reps for other shoulder exercises (see flow sheet)  with no increase in pain noted.   PT Next Visit Plan  strengthening, resistance bands and weight with UE ranger, continue ionto, progress dumbell exercises to include shoulder movements, PNF, taping        Problem List Patient Active Problem List   Diagnosis Date Noted  . 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 Essential hypertension 06/26/2014  . Memory loss 06/26/2014   Laury Axon, [place] 03/25/2015 2:47 PM [phone] [facility] [address] [place], [place], [address] Phone: [phone]   Fax:  [phone]  Name: Stephanie Juarez MRN:  [id number] Date of Birth: 1951-04-06

## 2015-03-25 NOTE — Telephone Encounter (Signed)
Patient called and requested a med refill for her blood pressure medication, patient needs enough to last until next appointment. Please f/u

## 2015-03-30 ENCOUNTER — Ambulatory Visit: Payer: Self-pay | Admitting: Physical Therapy

## 2015-03-30 DIAGNOSIS — R29898 Other symptoms and signs involving the musculoskeletal system: Secondary | ICD-10-CM

## 2015-03-30 DIAGNOSIS — R293 Abnormal posture: Secondary | ICD-10-CM

## 2015-03-30 DIAGNOSIS — M25511 Pain in right shoulder: Secondary | ICD-10-CM

## 2015-03-30 DIAGNOSIS — M25611 Stiffness of right shoulder, not elsewhere classified: Secondary | ICD-10-CM

## 2015-03-30 NOTE — Therapy (Signed)
[place], [place], [address] Phone: [phone]   Fax:  [phone]  Physical Therapy Treatment  Patient Details  Name: Stephanie Juarez MRN: [id number] Date of Birth: Aug 04, 1950 Referring Provider: Dr. Lilia Argue  Encounter Date: 03/30/2015      PT End of Session - 03/30/15 1410    Visit Number 8   Number of Visits 12   Date for PT Re-Evaluation 04/14/15   PT Start Time 1330   PT Stop Time 1415   PT Time Calculation (min) 45 min   Activity Tolerance Patient tolerated treatment well   Behavior During Therapy [facility] for tasks assessed/performed      Past Medical History  Diagnosis Date  . Hypertension   . Arthritis     Past Surgical History  Procedure Laterality Date  . Shoulder arthroscopy Left 2001    for cyst  . Mandible surgery Left 1981    impacted teeth    There were no vitals filed for this visit.  Visit Diagnosis:  Pain in joint of right shoulder  Weakness of right arm  Decreased ROM of right shoulder  Abnormal posture      Subjective Assessment - 03/30/15 1334    Subjective "I did good I did all my exercises"     Pain Score 3    Pain Location Shoulder   Pain Orientation Right   Pain Descriptors / Indicators Sore   Pain Type Chronic pain   Pain Onset More than a month ago   Pain Frequency Intermittent   Aggravating Factors  flexion and abduction   Pain Relieving Factors medication            [facility] PT Assessment - 03/30/15 0001    AROM   Right Shoulder Flexion 131 Degrees   Right Shoulder ABduction 120 Degrees   Strength   Right Shoulder Flexion 4/5   Right Shoulder Extension 4+/5   Right Shoulder ABduction 4/5   Right Shoulder Internal Rotation 4+/5   Right Shoulder External Rotation 4+/5                     [facility] Adult PT Treatment/Exercise - 03/30/15 1525    Shoulder Exercises: Supine   Horizontal ABduction AROM;Strengthening;Both;15  reps;Theraband   Theraband Level (Shoulder Horizontal ABduction) Level 2 (Red)           Other Supine Exercises serratus punches 5# r x 15   Other Supine Exercises PNF D2 2 x 15 with red theraband   Shoulder Exercises: Seated   Other Seated Exercises UE ranger flexion/ horizontal abduction and adduction x 5 min (standing with 2# cuff)   Shoulder Exercises: Standing   Theraband Level (Shoulder External Rotation) Level 4 (Blue)   External Rotation Weight (lbs) 10 reps 2 sets with towel   Theraband Level (Shoulder Internal Rotation) Level 4 (Blue)   Internal Rotation Weight (lbs) 10 reps 2 sets with towel   Extension Strengthening;Theraband;10 reps   Theraband Level (Shoulder Extension) Level 4 (Blue)   Row AROM;Strengthening;15 reps   Theraband Level (Shoulder Row) Body Blade Putting weight on shelf Level 4 (Blue) 2 minutes in flexion, 2 minutes in abduction 10# weight lifted with both hands onto shelf at eye level x 5 reps   Shoulder Exercises: ROM/Strengthening   UBE (Upper Arm Bike) L1 4 min forward 4 min back                  PT Short  Term Goals - 03/30/15 1411    PT SHORT TERM GOAL #1   Title pt will be I with intial HEP (03/24/2015)   Time 3   Period Weeks   Status Achieved   PT SHORT TERM GOAL #2   Title pt will increase R shoulder flexion/abduction by > 10 degrees with < 4/10 pain to promote functional progression (03/24/2015)   Baseline pain with abduction   Period Weeks   Status Achieved   PT SHORT TERM GOAL #3   Title pt will increase R shoulder strength to atleast 4-/5 to help with ADLs (03/24/2015)   Time 3   Period Weeks   Status Achieved   PT SHORT TERM GOAL #4   Title pt will be able to verbalize and demonstrate techniques to reduce R shoulder pain and reinjury via postural awarness and lifting/carrying mechanics (03/24/2015)   Time 3   Period Weeks   Status On-going           PT Long Term Goals - 03/30/15 1412    PT LONG TERM GOAL #1    Title pt will be I with all HEP given throughout therapy (04/14/2015)   Time 6   Period Weeks   Status On-going   PT LONG TERM GOAL #2   Title pt will demonstrate increase R shoulder AROM to [facility] compared bil with < 2/10 pain to assist with ADLS (15/01/4584)   Time 6   Period Weeks   Status On-going   PT LONG TERM GOAL #3   Title pt will increase R shoulder strength to >4/5 to assist with lifting and carrying activities associated ADLS (92/01/2445)   Time 6   Period Weeks   Status Achieved   PT LONG TERM GOAL #4   Title pt will be able to lift and carrying > 10 # overhead with < 2/10 pain to assist with possible work related acitivities (04/14/2015)  currently just to eye level   Time 6   Period Weeks   Status On-going   PT LONG TERM GOAL #5   Title pt will increase her FOTO score to > 67 to demonstrate increased function at discharge (04/14/2015)   Time 6   Period Weeks   Status On-going               Plan - 03/30/15 1515    Clinical Impression Statement Patient continues to make improvement in strength and ROM but still has pain especially in abduction and flexion eccentrically. Pt. met STG #2 (pt will increase R shoulder flexion/abduction by 10 degrees with <4/10 pain) and ST goal #3  ( pt will increase R shoulder strength to at least 4-/5 to help with ADLs)   PT Next Visit Plan  strengthening, resistance bands and weight with UE ranger, continue ionto, progress dumbell exercises to include shoulder movements, PNF, taping        Problem List Patient Active Problem List   Diagnosis Date Noted  . 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 Essential hypertension 06/26/2014  . Memory loss 06/26/2014   Laury Axon, [employer] 03/30/2015 3:28 PM PHONE:[phone] [facility] [place], [place], [address] Phone: [phone]   Fax:  [phone]  Name: Stephanie Juarez MRN:  [id number] Date of Birth: 05-03-1951

## 2015-04-01 ENCOUNTER — Encounter: Payer: Self-pay | Admitting: Physical Therapy

## 2015-04-06 ENCOUNTER — Ambulatory Visit: Payer: Self-pay | Admitting: Physical Therapy

## 2015-04-06 DIAGNOSIS — M25511 Pain in right shoulder: Secondary | ICD-10-CM

## 2015-04-06 DIAGNOSIS — R293 Abnormal posture: Secondary | ICD-10-CM

## 2015-04-06 DIAGNOSIS — R29898 Other symptoms and signs involving the musculoskeletal system: Secondary | ICD-10-CM

## 2015-04-06 DIAGNOSIS — M25611 Stiffness of right shoulder, not elsewhere classified: Secondary | ICD-10-CM

## 2015-04-06 NOTE — Therapy (Addendum)
[place], [place], [address] Phone: [phone]   Fax:  [phone]  Physical Therapy Treatment  Patient Details  Name: Stephanie Juarez MRN: [id number] Date of Birth: 02/11/1951 Referring Provider: Dr. Lilia Argue  Encounter Date: 04/06/2015      PT End of Session - 04/06/15 1552    Visit Number 9   Number of Visits 12   Date for PT Re-Evaluation 04/14/15   PT Start Time 1330   PT Stop Time 1415   PT Time Calculation (min) 45 min   Activity Tolerance Patient tolerated treatment well   Behavior During Therapy [facility] for tasks assessed/performed      Past Medical History  Diagnosis Date  . Hypertension   . Arthritis     Past Surgical History  Procedure Laterality Date  . Shoulder arthroscopy Left 2001    for cyst  . Mandible surgery Left 1981    impacted teeth    There were no vitals filed for this visit.  Visit Diagnosis:  Pain in joint of right shoulder  Weakness of right arm  Decreased ROM of right shoulder  Abnormal posture      Subjective Assessment - 04/06/15 1337    Subjective "My arm has been hurting more than it usually does" It hurts bad at night.    Pain Score 2             [facility] PT Assessment - 04/06/15 1421    Observation/Other Assessments   Focus on Therapeutic Outcomes (FOTO)  48% limited   AROM   Right Shoulder Flexion 129 Degrees   Right Shoulder ABduction 118 Degrees   Strength   Right Shoulder Flexion 4+/5   Right Shoulder Extension 4+/5   Right Shoulder ABduction 4/5   Right Shoulder Internal Rotation 4+/5   Right Shoulder External Rotation 4+/5                     [facility] Adult PT Treatment/Exercise - 04/06/15 1434    Self-Care   Self-Care Lifting;Posture   Lifting handout and explanation   Posture handout and explanation  also went over HEP and rechecked bands   Shoulder Exercises: Standing   Other Standing Exercises bicep curls  x20reps 5#   Other Standing Exercises overhead press with 5# dumbell x 10 reps; body blade flexion 2 minutes abduction 2 minutes   Shoulder Exercises: ROM/Strengthening   UBE (Upper Arm Bike) L1 4 minutes forward and 4 minutes back                  PT Short Term Goals - 04/06/15 1341    PT SHORT TERM GOAL #1   Title pt will be I with intial HEP (03/24/2015)   Time 3   Period Weeks   Status Achieved   PT SHORT TERM GOAL #2   Title pt will increase R shoulder flexion/abduction by > 10 degrees with < 4/10 pain to promote functional progression (03/24/2015)   Baseline pain with abduction   Time 3   Period Weeks   Status Achieved   PT SHORT TERM GOAL #3   Title pt will increase R shoulder strength to atleast 4-/5 to help with ADLs (03/24/2015)   Time 3   Period Weeks   Status Achieved   PT SHORT TERM GOAL #4   Status Achieved           PT Long Term Goals - 04/06/15 1345  PT LONG TERM GOAL #1   Title pt will be I with all HEP given throughout therapy (04/14/2015)   Time 6   Period Weeks   Status Achieved   PT LONG TERM GOAL #2   Title pt will demonstrate increase R shoulder AROM to [facility] compared bil with < 2/10 pain to assist with ADLS (76/06/8313)   Time 6   Period Weeks   Status Partially Met  no pain increase going up but pain increases on the way down   PT LONG TERM GOAL #3   Title pt will increase R shoulder strength to >4/5 to assist with lifting and carrying activities associated ADLS (17/10/1605)   Time 6   Period Weeks   Status Achieved   PT LONG TERM GOAL #4   Title pt will be able to lift and carrying > 10 # overhead with < 2/10 pain to assist with possible work related acitivities (04/14/2015)   Time 6   Period Weeks   Status Achieved  had to lean back for full ROM               Plan - 04/06/15 1339    Clinical Impression Statement This is her last visit scheduled and pt. is ready to be discharged and continue HEP at home. Patient has  achieved all goals except for pain free range of motion because she still has some pain when she is lowering her arm down after lifting it. Althought all her measurements indicated an increase in function including increased strength and ROM, her FOTO indicated greater limitation (see flowsheet) Handout and demonstration of proper posture and lifting was provided. Her Doctor's appointment is on the 21st and she can be evaluated then. HEP was reviewed and all proper bands were supplied.   PT Next Visit Plan n/a        Problem List Patient Active Problem List   Diagnosis Date Noted  . 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 Essential hypertension 06/26/2014  . Memory loss 06/26/2014   Laury Axon, [place] 04/06/2015 3:53 PM [phone] [facility] [place], [place], [address] Phone: [phone]   Fax:  [phone]  Name: Stephanie Juarez MRN: [id number] Date of Birth: 1950/07/17    PHYSICAL THERAPY DISCHARGE SUMMARY  Visits from Start of Care: 9  Current functional level related to goals / functional outcomes: FOTO 48% limited   Remaining deficits: Limited shoulder mobility, limited endurance and strength   Education / Equipment: HEP, therabands for strengthening.   Plan: Patient agrees to discharge.  Patient goals were partially met. Patient is being discharged due to being pleased with the current functional level.  ?????        Kristoffer Leamon PT, DPT, LAT, ATC  04/06/2015  4:34 PM

## 2015-04-06 NOTE — Patient Instructions (Signed)

## 2015-04-07 ENCOUNTER — Telehealth: Payer: Self-pay

## 2015-04-07 MED ORDER — AMLODIPINE BESYLATE 10 MG PO TABS: 10.0000 mg | ORAL_TABLET | Freq: Every day | ORAL | Status: DC

## 2015-04-07 NOTE — Telephone Encounter (Signed)
Spoke with patient and she is aware her blood pressure medication [facility]

## 2015-04-08 ENCOUNTER — Encounter: Payer: Self-pay | Admitting: Physical Therapy

## 2015-04-29 ENCOUNTER — Encounter: Payer: Self-pay | Admitting: Family Medicine

## 2015-04-29 ENCOUNTER — Ambulatory Visit (INDEPENDENT_AMBULATORY_CARE_PROVIDER_SITE_OTHER): Payer: Self-pay | Admitting: Family Medicine

## 2015-04-29 VITALS — BP 130/80 | Ht 64.0 in | Wt 265.0 lb

## 2015-04-29 DIAGNOSIS — M25511 Pain in right shoulder: Secondary | ICD-10-CM

## 2015-04-29 DIAGNOSIS — M7501 Adhesive capsulitis of right shoulder: Secondary | ICD-10-CM

## 2015-04-29 DIAGNOSIS — M25512 Pain in left shoulder: Secondary | ICD-10-CM

## 2015-04-29 MED ORDER — MELOXICAM 15 MG PO TABS: 15.0000 mg | ORAL_TABLET | Freq: Every day | ORAL | Status: DC

## 2015-04-29 MED ORDER — METHYLPREDNISOLONE ACETATE 40 MG/ML IJ SUSP
40.0000 mg | Freq: Once | INTRAMUSCULAR | Status: AC
  Administered 2015-04-29: 40 mg via INTRA_ARTICULAR

## 2015-04-29 NOTE — Progress Notes (Signed)
   Subjective:    Patient ID: Stephanie Juarez, female    DOB: 09/22/1950, 64 y.o.   MRN: [id number]  HPI  64 y/o female who presents with right shoulder and left knee pain.   Visit 02/20/15 - Her right shoulder pain has been ongoing since January.  She does not recall specific trauma, but does remember falling on it in December or January, in which she fell on her shoulder.  She does not recall it hurting immediately after that time.  She reports that her pain became progressively worse for 4-5 months, then did not become better or worse since that time.  She has significantly decreased ROM as well.  Denies swelling of the joint.  She does report pain in neck, shoulder, and sometimes some radiation down lateral arm to elbow.    Visit 03/11/15 - patient returns for follow-up today status post injection intra-articular using 9 mL of volume into her right shoulder. She is much improved after this and 1-2 sessions of physical therapy working on gentle range of motion. She is 52 to 60% better per her report. No nighttime awakenings or changes in other symptoms.  Visit 04/29/15  Patient with increased nighttime pain secondary to limited restriction of motion. She does feel she is doing better overall but her pain is increased. Physical therapy is now complete as she is doing home exercise program. Voltaren not working for at this time. She denies any new symptoms or paresthesias going down her arm. No night sweats, chills, fevers or weight loss.   Review of Systems MSK: +R shoulder and L knee pain, denies joint swelling Neuro: denies numbness paresthesias    Objective:   Physical Exam  Constitutional: She is oriented to person, place, and time. She appears well-developed and well-nourished.  obese  HENT:  Head: Normocephalic and atraumatic.  Neurological: She is alert and oriented to person, place, and time.  Skin: Skin is warm and dry.  Psychiatric: She has a normal mood and affect. Her behavior  is normal. Thought content normal.   MSK: Shoulder- No deformities or swelling present.  No atrophy.   -+TTP along right cervical paraspinal muscles, right trapezius along right shoulder  -AROM was decreased in all planes, flexion to 100 degrees, abduction to 120 degrees, extension and internal rotation to lateral glute, external rotation restricted  -PROM decreased in all planes, flexion to 140 degrees, abduction to 130 degrees, external and internal rotation restricted  -muscle strength intact bilaterally  -Neurovasc- sensation intact and pulses intact - + Neer/Hawkins

## 2015-04-29 NOTE — Assessment & Plan Note (Signed)
Repeat injection today as she has plateaued. She has completed physical therapy at this point and would recommend home exercise program to increase range of motion and strengthening. We may need to re-establish her in physical therapy if she does not respond to the injection. -Switch to Mobic 15 mg daily from Voltaren 75 mg twice a day -Intra-articular steroid injection using a 7: 3 today under ultrasound guidance.   Aspiration/Injection Procedure Note Bol[place]ar HawSallie T Blaker 02/27/1951  Procedure: Injection Indications: R adhesive capsulitis  Procedure Details Consent: Risks of procedure as well as the alternatives and risks of each were explained to the (patient/caregiver).  Consent for procedure obtained. Time Out: Verified patient identification, verified procedure, site/side was marked, verified correct patient position, special equipment/implants available, medications/allergies/relevent history reviewed, required imaging and test results available.  Performed.  The area was cleaned with iodine and alcohol swabs.    The R GH joint was injected using 3 cc's of 40mg  Depomedrol and 7 cc's of 1% lidocaine with a spinal needle.  Ultrasound was used. Images were obtained in Transverse and Long views showing the injection.    A sterile dressing was applied.  Patient did tolerate procedure well. Estimated blood loss: None

## 2015-05-01 ENCOUNTER — Ambulatory Visit: Payer: Self-pay | Admitting: Internal Medicine

## 2015-05-13 ENCOUNTER — Encounter: Payer: Self-pay | Admitting: Internal Medicine

## 2015-05-13 ENCOUNTER — Ambulatory Visit: Payer: Self-pay | Attending: Internal Medicine | Admitting: Internal Medicine

## 2015-05-13 VITALS — BP 149/82 | HR 86 | Temp 98.8°F | Resp 16 | Ht 65.0 in | Wt 266.0 lb

## 2015-05-13 DIAGNOSIS — Z6841 Body Mass Index (BMI) 40.0 and over, adult: Secondary | ICD-10-CM | POA: Insufficient documentation

## 2015-05-13 DIAGNOSIS — I10 Essential (primary) hypertension: Secondary | ICD-10-CM | POA: Insufficient documentation

## 2015-05-13 DIAGNOSIS — Z0000 Encounter for general adult medical examination without abnormal findings: Secondary | ICD-10-CM | POA: Insufficient documentation

## 2015-05-13 DIAGNOSIS — E669 Obesity, unspecified: Secondary | ICD-10-CM | POA: Insufficient documentation

## 2015-05-13 LAB — BASIC METABOLIC PANEL
BUN: 13 mg/dL (ref 7–25)
CO2: 25 mmol/L (ref 20–31)
Calcium: 9.2 mg/dL (ref 8.6–10.4)
Chloride: 100 mmol/L (ref 98–110)
Creat: 0.65 mg/dL (ref 0.50–0.99)
Glucose, Bld: 80 mg/dL (ref 65–99)
Potassium: 4.2 mmol/L (ref 3.5–5.3)
SODIUM: 137 mmol/L (ref 135–146)

## 2015-05-13 LAB — T4, FREE: FREE T4: 0.98 ng/dL (ref 0.80–1.80)

## 2015-05-13 LAB — POCT GLYCOSYLATED HEMOGLOBIN (HGB A1C): HEMOGLOBIN A1C: 5.3

## 2015-05-13 LAB — TSH: TSH: 3.003 u[IU]/mL (ref 0.350–4.500)

## 2015-05-13 MED ORDER — AMLODIPINE BESYLATE 10 MG PO TABS: 10.0000 mg | ORAL_TABLET | Freq: Every day | ORAL | Status: DC

## 2015-05-13 MED FILL — AMLODIPINE BESYLATE 10 MG T: 10 | 30 days supply | Qty: 30 | Fill #0

## 2015-05-13 NOTE — Progress Notes (Signed)
Patient ID: Stephanie Juarez, female   DOB: 03/15/1951, 65 y.o.   MRN: [id number] Subjective:  Stephanie Juarez is a 65 y.o. female with hypertension. Patient is compliant with Amlodipine daily without complaints of chest pain, SOB, palpitations, or edema. She states that she takes her blood pressure medication at night.  Patient reports that she has been seen by Orthopedics for adhesive capsulitis of her right shoulder and was encouraged to get tested for diabetes and thyroid disease. Patient admits to being treated with a thyroid medication when she was in her early 1820's. She has not been checked since then.  Current Outpatient Prescriptions  Medication Sig Dispense Refill  . amLODipine (NORVASC) 10 MG tablet Take 1 tablet (10 mg total) by mouth daily. 30 tablet 0  . diclofenac sodium (VOLTAREN) 1 % GEL Apply 1 application topically 4 (four) times daily. 100 g 0  . lurasidone (LATUDA) 40 MG TABS tablet Take 40 mg by mouth daily with breakfast.    . meloxicam (MOBIC) 15 MG tablet Take 1 tablet (15 mg total) by mouth daily. 30 tablet 5  . OVER THE COUNTER MEDICATION Take 1 tablet by mouth daily as needed (arthritis relief).    . traMADol (ULTRAM) 50 MG tablet Take 1 tablet (50 mg total) by mouth every 6 (six) hours as needed. 90 tablet 0  . [DISCONTINUED] loratadine (CLARITIN) 10 MG tablet Take 1 tablet (10 mg total) by mouth daily. (Patient not taking: Reported on 06/26/2014) 30 tablet 11   No current facility-administered medications for this visit.    ROS: Other than what is stated in HPI, all other systems are negative.   Objective:  BP 149/82 mmHg  Pulse 86  Temp(Src) 98.8 F (37.1 C)  Resp 16  Ht 5\' 5"  (1.651 m)  Wt 266 lb (120.657 kg)  BMI 44.26 kg/m2  SpO2 100%  Appearance alert, well appearing, and in no distress, oriented to person, place, and time and overweight. General exam S1, S2 normal, no gallop, no murmur, chest clear, no JVD, no HSM, no edema.  Lab review: orders written  for new lab studies as appropriate; see orders.   Assessment:   Stephanie Juarez was seen today for follow-up.  Diagnoses and all orders for this visit:  Essential hypertension -     Basic Metabolic Panel -     amLODipine (NORVASC) 10 MG tablet; Take 1 tablet (10 mg total) by mouth daily. Patient blood pressure is stable and may continue on current medication.  Education on diet, exercise, and modifiable risk factors discussed. Will obtain appropriate labs as needed. Will follow up in 3-6 months.   Obesity -     TSH -     T4, Free -     Lipid panel; Future Encouraged weight loss, dash diet, and exercise  Health care maintenance -     HgB A1c   Return in about 1 week (around 05/20/2015) for Lab Visit and 3 mo PCP .  /mec 05/13/2015 1:39 PM

## 2015-05-13 NOTE — Progress Notes (Signed)
Patient here for follow up on her HTN Patient would also like to be checked for thyroid disease and diabetes Was seen at sports medicine for frozen shoulder and they made mention of these Two issues

## 2015-05-19 ENCOUNTER — Telehealth: Payer: Self-pay

## 2015-05-19 NOTE — Telephone Encounter (Signed)
Spoke with patient this am and she is aware of her normal lab results 

## 2015-05-19 NOTE — Telephone Encounter (Signed)
-----   Message from Ambrose FinlandValerie A Keck, NP sent at 05/17/2015  5:20 PM EST ----- Labs normal. No thyroid disease

## 2015-05-20 ENCOUNTER — Ambulatory Visit: Payer: Self-pay | Attending: Internal Medicine

## 2015-05-20 DIAGNOSIS — E669 Obesity, unspecified: Secondary | ICD-10-CM

## 2015-05-20 LAB — LIPID PANEL
CHOL/HDL RATIO: 3.4 ratio (ref <=5.0)
CHOLESTEROL: 206 mg/dL — AB (ref 125–200)
HDL: 60 mg/dL (ref >=46)
LDL Cholesterol: 133 mg/dL — ABNORMAL HIGH (ref <130)
Triglycerides: 67 mg/dL (ref <150)
VLDL: 13 mg/dL (ref <30)

## 2015-05-25 ENCOUNTER — Telehealth: Payer: Self-pay

## 2015-05-25 DIAGNOSIS — E7800 Pure hypercholesterolemia, unspecified: Secondary | ICD-10-CM

## 2015-05-25 MED ORDER — ATORVASTATIN CALCIUM 10 MG PO TABS: 10.0000 mg | ORAL_TABLET | Freq: Every day | ORAL | Status: DC

## 2015-05-25 MED FILL — ?ATORVASTATIN 10 MG TABLET: 10 | 30 days supply | Qty: 30 | Fill #0

## 2015-05-25 NOTE — Telephone Encounter (Signed)
Spoke with patient this am and she is aware of her lab results [facility]

## 2015-05-25 NOTE — Telephone Encounter (Signed)
-----   Message from Ambrose FinlandValerie A Keck, NP sent at 05/22/2015  4:15 PM EST ----- Cholesterol is really elevated. Please go over things that increase cholesterol levels such as breads pasta, rice, butters, fried foods, etc. Please send her Lipitor 10 mg to take every evening with dinner. Please explain that high cholesterol places her at risk for stroke and heart disease

## 2015-06-10 ENCOUNTER — Ambulatory Visit (INDEPENDENT_AMBULATORY_CARE_PROVIDER_SITE_OTHER): Payer: Self-pay | Admitting: Family Medicine

## 2015-06-10 ENCOUNTER — Encounter: Payer: Self-pay | Admitting: Family Medicine

## 2015-06-10 VITALS — BP 151/71 | Ht 64.0 in | Wt 260.0 lb

## 2015-06-10 DIAGNOSIS — M25562 Pain in left knee: Secondary | ICD-10-CM

## 2015-06-10 DIAGNOSIS — M7501 Adhesive capsulitis of right shoulder: Secondary | ICD-10-CM

## 2015-06-10 DIAGNOSIS — M25511 Pain in right shoulder: Secondary | ICD-10-CM

## 2015-06-10 MED ORDER — TRAMADOL HCL 50 MG PO TABS: 50.0000 mg | ORAL_TABLET | Freq: Two times a day (BID) | ORAL | Status: DC | PRN

## 2015-06-10 MED ORDER — METHYLPREDNISOLONE ACETATE 40 MG/ML IJ SUSP
40.0000 mg | Freq: Once | INTRAMUSCULAR | Status: AC
Start: 2015-06-10 — End: 2015-06-10
  Administered 2015-06-10: 40 mg via INTRA_ARTICULAR

## 2015-06-10 NOTE — Progress Notes (Signed)
   Subjective:    Patient ID: Stephanie Juarez, female    DOB: 27-Apr-1951, 65 y.o.   MRN: [id number]  HPI  65 y/o female who presents with right shoulder and left knee pain.   Visit 02/20/15 - Her right shoulder pain has been ongoing since January.  She does not recall specific trauma, but does remember falling on it in December or January, in which she fell on her shoulder.  She does not recall it hurting immediately after that time.  She reports that her pain became progressively worse for 4-5 months, then did not become better or worse since that time.  She has significantly decreased ROM as well.  Denies swelling of the joint.  She does report pain in neck, shoulder, and sometimes some radiation down lateral arm to elbow.    Visit 03/11/15 - patient returns for follow-up today status post injection intra-articular using 9 mL of volume into her right shoulder. She is much improved after this and 1-2 sessions of physical therapy working on gentle range of motion. She is 52 to 60% better per her report. No nighttime awakenings or changes in other symptoms.  Visit 04/29/15  Patient with increased nighttime pain secondary to limited restriction of motion. She does feel she is doing better overall but her pain is increased. Physical therapy is now complete as she is doing home exercise program. Voltaren not working for at this time. She denies any new symptoms or paresthesias going down her arm. No night sweats, chills, fevers or weight loss.  Visit 06/10/15  Pain somewhat improved of R shoulder but still getting some nighttime awakenings.  ROM is about normal at this time.  Tramadol is working and states compliance with her HEP.    Review of Systems MSK: +R shoulder and L knee pain, denies joint swelling Neuro: denies numbness paresthesias    Objective:   Physical Exam  Constitutional: She is oriented to person, place, and time. She appears well-developed and well-nourished.  obese  HENT:  Head:  Normocephalic and atraumatic.  Neurological: She is alert and oriented to person, place, and time.  Skin: Skin is warm and dry.  Psychiatric: She has a normal mood and affect. Her behavior is normal. Thought content normal.   MSK: R Shoulder- No deformities or swelling present.  No atrophy.   -+TTP along right cervical paraspinal muscles, right trapezius along right shoulder  -AROM/PROM nml in all planes  -muscle strength intact bilaterally w/ negative empty can   -Neurovasc- sensation intact and pulses intact - + Neer/Hawkins

## 2015-06-10 NOTE — Assessment & Plan Note (Signed)
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  Time out performed.  Area cleaned with iodine x 3 and wiped clear with alcohol swab.  Using 21 1/2 gauge needle 1 cc Depo 40 mg and 3 cc's 1% Lidocaine were injected in subacromial space via posterior approach.  Sterile bandage placed.  Patient tolerated procedure well.  No complications.

## 2015-06-15 MED FILL — traMADol HCL 50 MG TABS: 50 | 45 days supply | Qty: 90 | Fill #0

## 2015-07-06 ENCOUNTER — Ambulatory Visit: Payer: Self-pay | Attending: Internal Medicine

## 2015-07-20 MED FILL — AMLODIPINE BESYLATE 10 MG T: 10 | 30 days supply | Qty: 30 | Fill #1

## 2015-07-22 ENCOUNTER — Encounter: Payer: Self-pay | Admitting: Family Medicine

## 2015-07-22 ENCOUNTER — Ambulatory Visit (INDEPENDENT_AMBULATORY_CARE_PROVIDER_SITE_OTHER): Payer: Self-pay | Admitting: Family Medicine

## 2015-07-22 ENCOUNTER — Other Ambulatory Visit: Payer: Self-pay | Admitting: *Deleted

## 2015-07-22 VITALS — BP 165/78 | Ht 64.0 in | Wt 260.0 lb

## 2015-07-22 DIAGNOSIS — M7501 Adhesive capsulitis of right shoulder: Secondary | ICD-10-CM

## 2015-07-22 NOTE — Progress Notes (Signed)
   Subjective:    Patient ID: Stephanie Juarez, female    DOB: 12/09/1950, 65 y.o.   MRN: [id number]  HPI  65 y/o female who presents with right shoulder and left knee pain.   Visit 02/20/15 - Her right shoulder pain has been ongoing since January.  She does not recall specific trauma, but does remember falling on it in December or January, in which she fell on her shoulder.  She does not recall it hurting immediately after that time.  She reports that her pain became progressively worse for 4-5 months, then did not become better or worse since that time.  She has significantly decreased ROM as well.  Denies swelling of the joint.  She does report pain in neck, shoulder, and sometimes some radiation down lateral arm to elbow.    Visit 03/11/15 - patient returns for follow-up today status post injection intra-articular using 9 mL of volume into her right shoulder. She is much improved after this and 1-2 sessions of physical therapy working on gentle range of motion. She is 52 to 60% better per her report. No nighttime awakenings or changes in other symptoms.  Visit 04/29/15  Patient with increased nighttime pain secondary to limited restriction of motion. She does feel she is doing better overall but her pain is increased. Physical therapy is now complete as she is doing home exercise program. Voltaren not working for at this time. She denies any new symptoms or paresthesias going down her arm. No night sweats, chills, fevers or weight loss.  Visit 06/10/15  Pain somewhat improved of R shoulder but still getting some nighttime awakenings.  ROM is about normal at this time.  Tramadol is working and states compliance with her HEP.   Visit 07/22/15  Right shoulder pain continues to bother her. She did greatly improved after physical therapy and intra-articular steroid injection but her range of motion is now decreased again. Denies frank weakness.  Left knee pain-patient states this is been aching for a  while. Worse at the end of the day or after activity. Hurts to go up or down stairs. Occasional swelling. No previous injury. She did have x-rays performed in 2015 that were nonweightbearing that showed mild medial and patellofemoral osteoarthritis.   Review of Systems MSK: +R shoulder and L knee pain, denies joint swelling Neuro: denies numbness paresthesias    Objective:   Physical Exam  Constitutional: She is oriented to person, place, and time. She appears well-developed and well-nourished.  obese  HENT:  Head: Normocephalic and atraumatic.  Neurological: She is alert and oriented to person, place, and time.  Skin: Skin is warm and dry.  Psychiatric: She has a normal mood and affect. Her behavior is normal. Thought content normal.   MSK: R Shoulder- No deformities or swelling present.  No atrophy.   -+TTP along right cervical paraspinal muscles, right trapezius along right shoulder  -AROM decreased to 100 degrees flexion, 75 abduction, ER 45 degrees.  Passive able to obtain 20 more on each.   -muscle strength intact bilaterally w/ negative empty can   -Neurovasc- sensation intact and pulses intact - + Neer/Hawkins  L Knee: TTP medial joint space.  + 1 crepitus.  Negative meniscal testing.  LLE Neurovascular intact.  No effusion or edema.

## 2015-07-22 NOTE — Assessment & Plan Note (Addendum)
Patient continues to have ongoing substantial pain that awakens her at night when sleeping on the shoulder. We have injected both intra-articularly and subacromial. Subacromial injection last visit did not help too much -Due to her underlying substantial pain that is limiting her activities of daily living we will go ahead and proceed with an MRI to evaluate underlying etiologies. -Follow-up after her scan.  If tendonopathy and evidence of Adhesive capsulitis, could consider referral for manipulation.

## 2015-07-30 ENCOUNTER — Ambulatory Visit
Admission: RE | Admit: 2015-07-30 | Discharge: 2015-07-30 | Disposition: A | Discharge status: Home / Self Care | Payer: No Typology Code available for payment source | Source: Ambulatory Visit | Attending: Family Medicine | Admitting: Family Medicine

## 2015-07-30 DIAGNOSIS — M7501 Adhesive capsulitis of right shoulder: Secondary | ICD-10-CM

## 2015-08-05 ENCOUNTER — Encounter: Payer: Self-pay | Admitting: Family Medicine

## 2015-08-05 ENCOUNTER — Ambulatory Visit (INDEPENDENT_AMBULATORY_CARE_PROVIDER_SITE_OTHER): Payer: Self-pay | Admitting: Family Medicine

## 2015-08-05 VITALS — BP 137/75 | HR 89 | Ht 64.0 in | Wt 260.0 lb

## 2015-08-05 DIAGNOSIS — M7501 Adhesive capsulitis of right shoulder: Secondary | ICD-10-CM

## 2015-08-05 DIAGNOSIS — M75121 Complete rotator cuff tear or rupture of right shoulder, not specified as traumatic: Secondary | ICD-10-CM

## 2015-08-05 NOTE — Progress Notes (Signed)
   Subjective:    Patient ID: Stephanie Juarez, female    DOB: 01/11/1951, 65 y.o.   MRN: [id number]  HPI  65 y/o female who presents with right shoulder and left knee pain.   Visit 02/20/15 - Her right shoulder pain has been ongoing since January.  She does not recall specific trauma, but does remember falling on it in December or January, in which she fell on her shoulder.  She does not recall it hurting immediately after that time.  She reports that her pain became progressively worse for 4-5 months, then did not become better or worse since that time.  She has significantly decreased ROM as well.  Denies swelling of the joint.  She does report pain in neck, shoulder, and sometimes some radiation down lateral arm to elbow.    Visit 03/11/15 - patient returns for follow-up today status post injection intra-articular using 9 mL of volume into her right shoulder. She is much improved after this and 1-2 sessions of physical therapy working on gentle range of motion. She is 52 to 60% better per her report. No nighttime awakenings or changes in other symptoms.  Visit 04/29/15  Patient with increased nighttime pain secondary to limited restriction of motion. She does feel she is doing better overall but her pain is increased. Physical therapy is now complete as she is doing home exercise program. Voltaren not working for at this time. She denies any new symptoms or paresthesias going down her arm. No night sweats, chills, fevers or weight loss.  Visit 06/10/15  Pain somewhat improved of R shoulder but still getting some nighttime awakenings.  ROM is about normal at this time.  Tramadol is working and states compliance with her HEP.   Visit 07/22/15  Right shoulder pain continues to bother her. She did greatly improved after physical therapy and intra-articular steroid injection but her range of motion is now decreased again. Denies frank weakness.  Visit 08/05/15  MRI of R shoulder shows large bursal  thickness tear as well as anterior full thickness tear of the supraspinatus.  She continues to have pain and limited ROM.     Review of Systems MSK: +R shoulder and L knee pain, denies joint swelling Neuro: denies numbness paresthesias    Objective:   Physical Exam  Constitutional: She is oriented to person, place, and time. She appears well-developed and well-nourished.  obese  HENT:  Head: Normocephalic and atraumatic.  Neurological: She is alert and oriented to person, place, and time.  Skin: Skin is warm and dry.  Psychiatric: She has a normal mood and affect. Her behavior is normal. Thought content normal.   MSK: R Shoulder- No deformities or swelling present.  No atrophy.   -+TTP along right cervical paraspinal muscles, right trapezius along right shoulder  -AROM decreased to 100 degrees flexion, 75 abduction, ER 45 degrees.  Passive able to obtain 20 more on each.   -muscle strength intact bilaterally w/ negative empty can   -Neurovasc- sensation intact and pulses intact - + Neer/Hawkins

## 2015-08-05 NOTE — Assessment & Plan Note (Signed)
High grade partial thickness tear as well as full thickness tear with ongoing pain.  She has failed conservative management up to this point including intraarticular, subacromial injections, PT, activity modification, and ice/rest.   - Referral to Dr. Dion SaucierLandau for his shoulder expertise and pt amenable to this as she most likely has a surgical case

## 2015-08-13 MED FILL — DICLOFENAC SOD DR 75 MG TAB: 75 | 30 days supply | Qty: 60 | Fill #1

## 2015-09-02 ENCOUNTER — Other Ambulatory Visit (HOSPITAL_COMMUNITY): Payer: Self-pay | Admitting: Orthopedic Surgery

## 2015-09-02 DIAGNOSIS — M25512 Pain in left shoulder: Secondary | ICD-10-CM

## 2015-09-11 ENCOUNTER — Ambulatory Visit (HOSPITAL_COMMUNITY)
Admission: RE | Admit: 2015-09-11 | Discharge: 2015-09-11 | Disposition: A | Discharge status: Home / Self Care | Payer: Self-pay | Source: Ambulatory Visit | Attending: Orthopedic Surgery | Admitting: Orthopedic Surgery

## 2015-09-11 DIAGNOSIS — M25512 Pain in left shoulder: Secondary | ICD-10-CM | POA: Insufficient documentation

## 2015-09-11 DIAGNOSIS — M7582 Other shoulder lesions, left shoulder: Secondary | ICD-10-CM | POA: Insufficient documentation

## 2015-09-11 DIAGNOSIS — M75102 Unspecified rotator cuff tear or rupture of left shoulder, not specified as traumatic: Secondary | ICD-10-CM | POA: Insufficient documentation

## 2015-09-11 MED ORDER — LIDOCAINE HCL (PF) 1 % IJ SOLN
INTRAMUSCULAR | Status: AC
  Administered 2015-09-11: 5 mL via INTRAMUSCULAR
  Filled 2015-09-11: qty 5

## 2015-09-11 MED ORDER — IOHEXOL 180 MG/ML  SOLN
20.0000 mL | Freq: Once | INTRAMUSCULAR | Status: AC | PRN
  Administered 2015-09-11: 15 mL via INTRA_ARTICULAR

## 2015-09-11 MED ORDER — GADOBENATE DIMEGLUMINE 529 MG/ML IV SOLN
5.0000 mL | Freq: Once | INTRAVENOUS | Status: AC | PRN
  Administered 2015-09-11: 0.05 mL via INTRA_ARTICULAR

## 2015-09-11 NOTE — Progress Notes (Signed)
Patient ID: Stephanie Juarez, female   DOB: 05/22/1950, 65 y.o.   MRN: [id number] Right shoulder injection performed with fluoroscopic guidance.

## 2015-09-23 MED FILL — ACETAMINOPHEN/COD #3 TABLET: 300-30 | 30 days supply | Qty: 30 | Fill #0

## 2015-10-06 MED FILL — ?ATORVASTATIN 10 MG TABLET: 10 | 30 days supply | Qty: 30 | Fill #1

## 2015-10-06 MED FILL — ?AMLODIPINE BESYLATE 10 MG: 10 | 30 days supply | Qty: 30 | Fill #2

## 2015-11-04 MED FILL — ACETAMINOPHEN/COD #3 TABLET: 300-30 | 15 days supply | Qty: 30 | Fill #0

## 2015-11-18 ENCOUNTER — Ambulatory Visit: Payer: Self-pay | Attending: Family Medicine | Admitting: Family Medicine

## 2015-11-18 ENCOUNTER — Encounter: Payer: Self-pay | Admitting: Family Medicine

## 2015-11-18 VITALS — BP 114/72 | HR 89 | Temp 98.3°F | Ht 64.0 in | Wt 255.4 lb

## 2015-11-18 DIAGNOSIS — M25562 Pain in left knee: Secondary | ICD-10-CM

## 2015-11-18 DIAGNOSIS — I10 Essential (primary) hypertension: Secondary | ICD-10-CM

## 2015-11-18 DIAGNOSIS — M7501 Adhesive capsulitis of right shoulder: Secondary | ICD-10-CM

## 2015-11-18 DIAGNOSIS — M25569 Pain in unspecified knee: Secondary | ICD-10-CM | POA: Insufficient documentation

## 2015-11-18 MED ORDER — IBUPROFEN 800 MG PO TABS: 800.0000 mg | ORAL_TABLET | Freq: Two times a day (BID) | ORAL | Status: DC | PRN

## 2015-11-18 MED FILL — AMLODIPINE BESYLATE 10 MG T: 10 | 30 days supply | Qty: 30 | Fill #3

## 2015-11-18 MED FILL — IBUPROFEN 800 MG TABLET: 800 | 30 days supply | Qty: 60 | Fill #0

## 2015-11-18 NOTE — Progress Notes (Signed)
Subjective:  Patient ID: Stephanie Juarez, female    DOB: 07/17/1950  Age: 65 y.o. MRN: [id number]  CC: Knee Pain   HPI Stephanie Juarez is a 65 year old female with a history of obesity, hypertension, adhesive capsulitis of the shoulders and chronic knee pain who presents today requesting referral back to sports medicine which she previously had cortisone shots. Complains of pain in both shoulders of the left knee which is worse at night and this causes restriction in her range of motion. She informs me she is trying to put off surgery as long as she can. Requests prescription for ibuprofen 800 mg.   Past Medical History  Diagnosis Date  . Hypertension   . Arthritis     Past Surgical History  Procedure Laterality Date  . Shoulder arthroscopy Left 2001    for cyst  . Mandible surgery Left 1981    impacted teeth    Allergies  Allergen Reactions  . Tramadol Itching    Social History   Social History  . Marital Status: Single    Spouse Name: N/A  . Number of Children: N/A  . Years of Education: N/A   Occupational History  . Not on file.   Social History Main Topics  . Smoking status: Former Games developermoker  . Smokeless tobacco: Not on file  . Alcohol Use: No  . Drug Use: No  . Sexual Activity: Not on file   Other Topics Concern  . Not on file   Social History Narrative     Outpatient Prescriptions Prior to Visit  Medication Sig Dispense Refill  . amLODipine (NORVASC) 10 MG tablet Take 1 tablet (10 mg total) by mouth daily. 30 tablet 5  . atorvastatin (LIPITOR) 10 MG tablet Take 1 tablet (10 mg total) by mouth daily. 90 tablet 3  . lurasidone (LATUDA) 40 MG TABS tablet Take 40 mg by mouth daily with breakfast.    . diclofenac sodium (VOLTAREN) 1 % GEL Apply 1 application topically 4 (four) times daily. (Patient not taking: Reported on 11/18/2015) 100 g 0  . meloxicam (MOBIC) 15 MG tablet Take 1 tablet (15 mg total) by mouth daily. (Patient not taking: Reported on  11/18/2015) 30 tablet 5  . OVER THE COUNTER MEDICATION Take 1 tablet by mouth daily as needed (arthritis relief).    . traMADol (ULTRAM) 50 MG tablet Take 1 tablet (50 mg total) by mouth every 12 (twelve) hours as needed. 90 tablet 0   No facility-administered medications prior to visit.    ROS Review of Systems  Constitutional: Negative for activity change and appetite change.  HENT: Negative for sinus pressure and sore throat.   Respiratory: Negative for chest tightness, shortness of breath and wheezing.   Cardiovascular: Negative for chest pain and palpitations.  Gastrointestinal: Negative for abdominal pain, constipation and abdominal distention.  Genitourinary: Negative.   Musculoskeletal:       See hpi  Psychiatric/Behavioral: Negative for behavioral problems and dysphoric mood.    Objective:  BP 114/72 mmHg  Pulse 89  Temp(Src) 98.3 F (36.8 C) (Oral)  Ht 5\' 4"  (1.626 m)  Wt 255 lb 6.4 oz (115.849 kg)  BMI 43.82 kg/m2  SpO2 93%  BP/Weight 11/18/2015 08/05/2015 07/22/2015  Systolic BP 114 137 165  Diastolic BP 72 75 78  Wt. (Lbs) 255.4 260 260  BMI 43.82 44.61 44.61      Physical Exam  Constitutional: She is oriented to person, place, and time. She appears well-developed and well-nourished.  obese  Cardiovascular: Normal rate, normal heart sounds and intact distal pulses.   No murmur heard. Pulmonary/Chest: Effort normal and breath sounds normal. She has no wheezes. She has no rales. She exhibits no tenderness.  Abdominal: Soft. Bowel sounds are normal. She exhibits no distension and no mass. There is no tenderness.  Musculoskeletal: She exhibits tenderness (mild tenderness on passive ROM of both shoulders and left knee).  Neurological: She is alert and oriented to person, place, and time.     Assessment & Plan:   1. Adhesive capsulitis of right shoulder - Ambulatory referral to Sports Medicine  2. Left knee pain Placed on Ibuprofen prn Weight loss will  also help alleviate symptoms. - Ambulatory referral to Sports Medicine  3. Essential hypertension Controlled on Amlodipine   No orders of the defined types were placed in this encounter.    Follow-up: Return in about 1 month (around 12/19/2015) for For follow-up of hypertension.   Jaclyn Shaggy MD

## 2015-12-28 ENCOUNTER — Ambulatory Visit: Payer: Self-pay | Attending: Internal Medicine

## 2016-01-06 MED FILL — IBUPROFEN 800 MG TABLET: 800 | 30 days supply | Qty: 60 | Fill #1

## 2016-01-06 MED FILL — AMLODIPINE BESYLATE 10 MG T: 10 | 30 days supply | Qty: 30 | Fill #4

## 2016-02-16 MED FILL — IBUPROFEN 800 MG TABLET: 800 | 30 days supply | Qty: 60 | Fill #2

## 2016-03-12 ENCOUNTER — Emergency Department (HOSPITAL_COMMUNITY): Payer: Self-pay

## 2016-03-12 ENCOUNTER — Encounter (HOSPITAL_COMMUNITY): Payer: Self-pay | Admitting: Emergency Medicine

## 2016-03-12 ENCOUNTER — Emergency Department (HOSPITAL_COMMUNITY)
Admission: EM | Admit: 2016-03-12 | Discharge: 2016-03-12 | Disposition: A | Discharge status: Home / Self Care | Payer: Self-pay | Attending: Emergency Medicine | Admitting: Emergency Medicine

## 2016-03-12 DIAGNOSIS — I10 Essential (primary) hypertension: Secondary | ICD-10-CM | POA: Insufficient documentation

## 2016-03-12 DIAGNOSIS — K8020 Calculus of gallbladder without cholecystitis without obstruction: Secondary | ICD-10-CM

## 2016-03-12 DIAGNOSIS — R1011 Right upper quadrant pain: Secondary | ICD-10-CM

## 2016-03-12 DIAGNOSIS — Z87891 Personal history of nicotine dependence: Secondary | ICD-10-CM | POA: Insufficient documentation

## 2016-03-12 LAB — COMPREHENSIVE METABOLIC PANEL
ALT: 13 U/L — ABNORMAL LOW (ref 14–54)
AST: 20 U/L (ref 15–41)
Albumin: 3.9 g/dL (ref 3.5–5.0)
Alkaline Phosphatase: 65 U/L (ref 38–126)
Anion gap: 9 (ref 5–15)
BUN: 13 mg/dL (ref 6–20)
CO2: 26 mmol/L (ref 22–32)
Calcium: 9.3 mg/dL (ref 8.9–10.3)
Chloride: 105 mmol/L (ref 101–111)
Creatinine, Ser: 0.91 mg/dL (ref 0.44–1.00)
GFR calc Af Amer: >60 mL/min (ref >60)
GFR calc non Af Amer: >60 mL/min (ref >60)
Glucose, Bld: 140 mg/dL — ABNORMAL HIGH (ref 65–99)
Potassium: 2.9 mmol/L — ABNORMAL LOW (ref 3.5–5.1)
Sodium: 140 mmol/L (ref 135–145)
Total Bilirubin: 0.5 mg/dL (ref 0.3–1.2)
Total Protein: 7.9 g/dL (ref 6.5–8.1)

## 2016-03-12 LAB — URINALYSIS, ROUTINE W REFLEX MICROSCOPIC
Bilirubin Urine: NEGATIVE
Glucose, UA: NEGATIVE mg/dL
Hgb urine dipstick: NEGATIVE
Ketones, ur: NEGATIVE mg/dL
Leukocytes, UA: NEGATIVE
Nitrite: NEGATIVE
Protein, ur: NEGATIVE mg/dL
Specific Gravity, Urine: 1.015 (ref 1.005–1.030)
pH: 7 (ref 5.0–8.0)

## 2016-03-12 LAB — CBC
HEMATOCRIT: 39.3 % (ref 36.0–46.0)
Hemoglobin: 12.2 g/dL (ref 12.0–15.0)
MCH: 26.2 pg (ref 26.0–34.0)
MCHC: 31 g/dL (ref 30.0–36.0)
MCV: 84.3 fL (ref 78.0–100.0)
PLATELETS: 182 10*3/uL (ref 150–400)
RBC: 4.66 MIL/uL (ref 3.87–5.11)
RDW: 14.1 % (ref 11.5–15.5)
WBC: 6.7 10*3/uL (ref 4.0–10.5)

## 2016-03-12 LAB — LIPASE, BLOOD: Lipase: 23 U/L (ref 11–51)

## 2016-03-12 MED ORDER — IOPAMIDOL (ISOVUE-300) INJECTION 61%
INTRAVENOUS | Status: AC
  Administered 2016-03-12: 100 mL
  Filled 2016-03-12: qty 100

## 2016-03-12 MED ORDER — POTASSIUM CHLORIDE 20 MEQ PO PACK: 40.0000 meq | PACK | Freq: Once | ORAL | Status: DC

## 2016-03-12 MED ORDER — ONDANSETRON HCL 4 MG/2ML IJ SOLN
4.0000 mg | Freq: Once | INTRAMUSCULAR | Status: AC
Start: 2016-03-12 — End: 2016-03-12
  Administered 2016-03-12: 4 mg via INTRAVENOUS
  Filled 2016-03-12: qty 2

## 2016-03-12 MED ORDER — GI COCKTAIL ~~LOC~~
30.0000 mL | Freq: Once | ORAL | Status: AC
  Administered 2016-03-12: 30 mL via ORAL
  Filled 2016-03-12: qty 30

## 2016-03-12 MED ORDER — ONDANSETRON 4 MG PO TBDP: 4.0000 mg | ORAL_TABLET | Freq: Three times a day (TID) | ORAL | 0 refills | Status: DC | PRN

## 2016-03-12 MED ORDER — HYDROCODONE-ACETAMINOPHEN 5-325 MG PO TABS: 2.0000 | ORAL_TABLET | ORAL | 0 refills | Status: DC | PRN

## 2016-03-12 MED ORDER — MORPHINE SULFATE (PF) 4 MG/ML IV SOLN
4.0000 mg | Freq: Once | INTRAVENOUS | Status: AC
  Administered 2016-03-12: 4 mg via INTRAVENOUS
  Filled 2016-03-12: qty 1

## 2016-03-12 MED ORDER — POTASSIUM CHLORIDE 20 MEQ PO PACK
40.0000 meq | PACK | Freq: Once | ORAL | Status: AC
  Administered 2016-03-12: 40 meq via ORAL
  Filled 2016-03-12: qty 2

## 2016-03-12 NOTE — ED Provider Notes (Signed)
[facility]-EMERGENCY DEPT Provider Note   CSN: [id number] Arrival date & time: 03/12/16  [id number]     History   Chief Complaint Chief Complaint  Patient presents with  . Abdominal Pain    HPI Stephanie Juarez is a 65 y.o. female with a hx of HTN presents to the ED noting the acute onset of right lower quadrant and periumbilical pain which she describes as achy and constant waxing and waning since around 5 AM which woke her from sleep this morning accompanied by nausea, vomiting, watery diarrhea x3. She has had small streaks of blood in her third episode of emesis, but no frank hematemesis. She denies any fever, chills, dysuria, hematuria, urinary urgency, urinary frequency, vaginal discharge, vaginal bleeding chest pain, palpitations or chest tightness. The patient notes a similar episode on 10/30 with identical symptoms that lasted for a few hours and then abated after she took some Tums. She remained asymptomatic through the week until this morning when her symptoms returned. She denies any known sick contacts, recent travel, suspicious food intake. She states last night she ate steak which was normal for her. She states that prior to arrival she took some Tums again which improved her symptoms bringing her pain down to approximately 5 out of 10.   HPI  Past Medical History:  Diagnosis Date  . Arthritis   . Hypertension     Patient Active Problem List   Diagnosis Date Noted  . Knee pain 11/18/2015  . Complete tear of right rotator cuff 08/05/2015  . 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 Essential hypertension 06/26/2014  . Memory loss 06/26/2014    Past Surgical History:  Procedure Laterality Date  . MANDIBLE SURGERY Left 1981   impacted teeth  . SHOULDER ARTHROSCOPY Left 2001   for cyst    OB History    No data available       Home Medications    Prior to Admission medications   Medication Sig Start Date End Date Taking? Authorizing Provider  atorvastatin  (LIPITOR) 10 MG tablet Take 1 tablet (10 mg total) by mouth daily. 05/25/15  Yes Ambrose FinlandValerie A Keck, NP  tetrahydrozoline (VISINE) 0.05 % ophthalmic solution Place 1 drop into both eyes daily as needed (dry eyes).   Yes Historical Provider, MD  amLODipine (NORVASC) 10 MG tablet Take 1 tablet (10 mg total) by mouth daily. 05/13/15   Ambrose FinlandValerie A Keck, NP  diclofenac sodium (VOLTAREN) 1 % GEL Apply 1 application topically 4 (four) times daily. Patient not taking: Reported on 11/18/2015 01/16/15   Hayden Rasmussenavid Mabe, NP  HYDROcodone-acetaminophen (NORCO/VICODIN) 5-325 MG tablet Take 2 tablets by mouth every 4 (four) hours as needed. 03/12/16   Francoise CeoWarren S Cannan Beeck, DO  ibuprofen (ADVIL,MOTRIN) 800 MG tablet Take 1 tablet (800 mg total) by mouth every 12 (twelve) hours as needed. Patient taking differently: Take 800 mg by mouth every 12 (twelve) hours as needed for mild pain.  11/18/15   Jaclyn ShaggyEnobong Amao, MD  lurasidone (LATUDA) 40 MG TABS tablet Take 40 mg by mouth daily with breakfast.    Historical Provider, MD  ondansetron (ZOFRAN ODT) 4 MG disintegrating tablet Take 1 tablet (4 mg total) by mouth every 8 (eight) hours as needed for nausea or vomiting. 03/12/16   Francoise CeoWarren S Ivah Girardot, DO    Family History Family History  Problem Relation Age of Onset  . Arthritis Mother   . Heart disease Mother   . Heart disease Father     Social History  Social History  Substance Use Topics  . Smoking status: Former Games developer  . Smokeless tobacco: Not on file  . Alcohol use No     Allergies   Tramadol   Review of Systems Review of Systems  Constitutional: Positive for activity change and appetite change. Negative for chills and fever.  Respiratory: Negative for cough, chest tightness and shortness of breath.   Cardiovascular: Negative for chest pain.  Gastrointestinal: Positive for abdominal pain, nausea and vomiting. Negative for abdominal distention, blood in stool, constipation and diarrhea.  Genitourinary: Negative for dysuria,  flank pain, frequency, hematuria, pelvic pain, urgency, vaginal bleeding, vaginal discharge and vaginal pain.  Musculoskeletal: Negative for arthralgias, back pain, myalgias and neck pain.  Skin: Negative for rash.  Neurological: Negative for syncope, weakness, light-headedness, numbness and headaches.  All other systems reviewed and are negative.    Physical Exam Updated Vital Signs BP 148/63   Pulse 61   Temp 98.8 F (37.1 C) (Oral)   Resp 18   Ht 5\' 4"  (1.626 m)   Wt 114.8 kg   SpO2 93%   BMI 43.43 kg/m   Physical Exam  Constitutional: She is oriented to person, place, and time. She appears well-developed and well-nourished. No distress.  HENT:  Head: Normocephalic and atraumatic.  Nose: Nose normal.  Mouth/Throat: Oropharynx is clear and moist.  Eyes: Conjunctivae and EOM are normal. Pupils are equal, round, and reactive to light.  Neck: Normal range of motion. Neck supple.  Cardiovascular: Normal rate, regular rhythm, normal heart sounds and intact distal pulses.   Pulmonary/Chest: Effort normal and breath sounds normal. She exhibits no tenderness.  Abdominal: Soft. She exhibits no distension. There is tenderness in the right lower quadrant and periumbilical area. There is tenderness at McBurney's point. There is negative Murphy's sign.    Musculoskeletal: She exhibits no edema or tenderness.  Neurological: She is alert and oriented to person, place, and time. No cranial nerve deficit. Coordination normal.  Skin: Skin is warm and dry. She is not diaphoretic.  Nursing note and vitals reviewed.    ED Treatments / Results  Labs (all labs ordered are listed, but only abnormal results are displayed) Labs Reviewed  COMPREHENSIVE METABOLIC PANEL - Abnormal; Notable for the following:       Result Value   Potassium 2.9 (*)    Glucose, Bld 140 (*)    ALT 13 (*)    All other components within normal limits  LIPASE, BLOOD  CBC  URINALYSIS, ROUTINE W REFLEX MICROSCOPIC  (NOT AT [facility])    EKG  EKG Interpretation None       Radiology Ct Abdomen Pelvis W Contrast  Result Date: 03/12/2016 CLINICAL DATA:  Right lower quadrant pain. EXAM: CT ABDOMEN AND PELVIS WITH CONTRAST TECHNIQUE: Multidetector CT imaging of the abdomen and pelvis was performed using the standard protocol following bolus administration of intravenous contrast. CONTRAST:  ISOVUE-300 IOPAMIDOL (ISOVUE-300) INJECTION 61% COMPARISON:  None. FINDINGS: Lower chest: Mild atelectasis at the lung bases. No pleural effusions. Hepatobiliary: There appears to be diffuse wall thickening in the gallbladder. No significant distention of the gallbladder. No significant stranding around the gallbladder. Normal appearance of the liver and the portal venous system is patent. Pancreas: Normal appearance of the pancreas without inflammation or duct dilatation. Spleen: Normal appearance of spleen without enlargement. Adrenals/Urinary Tract: Normal adrenal glands. Normal appearance of both kidneys without hydronephrosis. Urinary bladder is unremarkable. Probable small cyst in the right upper/mid pole region. Stomach/Bowel: Small hiatal hernia.  Normal appearance of the duodenum. No evidence for bowel obstruction. Appendix is normal. Vascular/Lymphatic: No significant vascular findings are present. No enlarged abdominal or pelvic lymph nodes. Reproductive: Uterus and bilateral adnexa are unremarkable. Other: No free fluid. Negative for free air. Small umbilical hernia containing fat. Musculoskeletal: Disc space narrowing at L5-S1. Facet arthropathy in the lower lumbar spine. IMPRESSION: Diffuse gallbladder wall thickening. These findings could be associated with acute cholecystitis. Right upper quadrant ultrasound would be useful to look for gallstones. No significant biliary dilatation. Electronically Signed   By: Richarda Overlie M.D.   On: 03/12/2016 13:23   US Abdomen Limited Ruq  Result Date: 03/12/2016 CLINICAL DATA:   Right upper quadrant pain EXAM: US ABDOMEN LIMITED - RIGHT UPPER QUADRANT COMPARISON:  CT from earlier in the same day FINDINGS: Gallbladder: Multiple gallstones are noted with evidence of wall thickening similar to that noted on prior CT examination. Common bile duct: Diameter: 3.7 mm. Liver: No focal lesion identified. Within normal limits in parenchymal echogenicity. IMPRESSION: Cholelithiasis and gallbladder wall thickening. A negative sonographic Eulah Pont sign is noted. This would be consistent with acute cholecystitis however in the appropriate clinical setting. Electronically Signed   By: Alcide Clever M.D.   On: 03/12/2016 14:46    Procedures Procedures (including critical care time)  Medications Ordered in ED Medications  gi cocktail (Maalox,Lidocaine,Donnatal) (30 mLs Oral Given 03/12/16 0854)  ondansetron (ZOFRAN) injection 4 mg (4 mg Intravenous Given 03/12/16 0854)  iopamidol (ISOVUE-300) 61 % injection (100 mLs  Contrast Given 03/12/16 1253)  morphine 4 MG/ML injection 4 mg (4 mg Intravenous Given 03/12/16 1035)  potassium chloride (KLOR-CON) packet 40 mEq (40 mEq Oral Given 03/12/16 1315)     Initial Impression / Assessment and Plan / ED Course  I have reviewed the triage vital signs and the nursing notes.  Pertinent labs & imaging results that were available during my care of the patient were reviewed by me and considered in my medical decision making (see chart for details).  Clinical Course   65 year old female presents with acute onset of right-sided abdominal pain this morning. Exam as above. Concern for possible appendicitis, cholelithiasis, cholecystitis, versus viral gastroenteritis, food poisoning. Lower suspicion for ovarian torsion, SBO, volvulus.   Labs were drawn and returned showing hypokalemia with K of 2.9, given PO, but otherwise reassuring.  UA negative.   CT abdomen and pelvis was ordered to further evaluate and returned showing thickened gall bladder wall,  but no clear evidence of stones, stranding, or distention.  Repeat examination finds her nontender to palpation.   General surgery was consulted and recommended pursuing RUQ [place] to further evaluate given labs and reassuring repeat examination.   [place] returned showing cholelithiasis with gall bladder wall thickening but no sonographic murphy's signs.   Her case was discussed with GS once again who recommended she follow up closely in clinic. She was rx'd pain medication and zofran and was recommended to avoid fatty foods. Return precautions were given for worsening or concerns, and she stated both understanding and agreement with this plan.    Final Clinical Impressions(s) / ED Diagnoses   Final diagnoses:  RUQ pain  Calculus of gallbladder without cholecystitis without obstruction    New Prescriptions Discharge Medication List as of 03/12/2016  3:39 PM    START taking these medications   Details  HYDROcodone-acetaminophen (NORCO/VICODIN) 5-325 MG tablet Take 2 tablets by mouth every 4 (four) hours as needed., Starting Sat 03/12/2016, Print    ondansetron [facility]  ODT) 4 MG disintegrating tablet Take 1 tablet (4 mg total) by mouth every 8 (eight) hours as needed for nausea or vomiting., Starting Sat 03/12/2016, Print         Francoise CeoWarren S Laya Letendre, DO 03/12/16 1948    Arby BarretteMarcy Pfeiffer, MD 03/17/16 1557

## 2016-03-12 NOTE — ED Notes (Signed)
Pt transported to US

## 2016-03-12 NOTE — ED Notes (Signed)
CT called and made aware pts CMP as now resulted. Explained delay to pt.

## 2016-03-12 NOTE — ED Notes (Signed)
Pt unable to void at this time.  Pt is aware of needed void.

## 2016-03-12 NOTE — ED Provider Notes (Addendum)
I saw and evaluated the patient, reviewed the resident's note and I agree with the findings and plan.   EKG Interpretation None     Patient has had acute onset of abdominal pain during the night. She describes severe pain in the right mid quadrant and lateral to upper quadrant. Patient had several episodes of vomiting after onset of pain. She tried to make herself have a bowel movement to relieve discomfort. She reports she could not get into a comfortable position and hurt if she lay on her side.  On examination, the patient is alert and appropriate. Heart and lung exam are normal. Abdominal examination is for right lateral and McBurney's point tenderness as well as right upper quadrant tenderness.  Patient reports one other episode of similar pain about a week ago that spontaneously resolved. Patient remains in significant pain as I performing her examination. This time, we will proceed with diagnostic evaluation of labs and CT scan.   Arby BarretteMarcy Curlie Macken, MD 03/12/16 1058 After consultation with general surgery, patient was pain controlled and plan was for outpatient follow-up for cholelithiasis.   Arby BarretteMarcy Neilson Oehlert, MD 03/17/16 [phone]

## 2016-03-12 NOTE — ED Notes (Signed)
Pt being transported to CT

## 2016-03-12 NOTE — ED Triage Notes (Signed)
Pt states she woke up this am at 530am and was having RLQ pain with n/v/d. Pt states she had 3 episodes of diarrhea and vomiting x3.

## 2016-03-12 NOTE — ED Notes (Signed)
Labs were collects and sent at 0834, CMP or lipase still not resulted. Main lab called to clarify why. Lab states "instrument kicked sample off and in process at this time".

## 2016-03-15 ENCOUNTER — Encounter: Payer: Self-pay | Admitting: Family Medicine

## 2016-03-15 ENCOUNTER — Ambulatory Visit: Payer: Self-pay | Attending: Family Medicine | Admitting: Family Medicine

## 2016-03-15 VITALS — BP 158/83 | HR 86 | Temp 98.3°F | Ht 64.0 in | Wt 257.4 lb

## 2016-03-15 DIAGNOSIS — I10 Essential (primary) hypertension: Secondary | ICD-10-CM | POA: Insufficient documentation

## 2016-03-15 DIAGNOSIS — Z6841 Body Mass Index (BMI) 40.0 and over, adult: Secondary | ICD-10-CM | POA: Insufficient documentation

## 2016-03-15 DIAGNOSIS — E876 Hypokalemia: Secondary | ICD-10-CM | POA: Insufficient documentation

## 2016-03-15 DIAGNOSIS — E669 Obesity, unspecified: Secondary | ICD-10-CM | POA: Insufficient documentation

## 2016-03-15 DIAGNOSIS — Z9119 Patient's noncompliance with other medical treatment and regimen: Secondary | ICD-10-CM | POA: Insufficient documentation

## 2016-03-15 DIAGNOSIS — E785 Hyperlipidemia, unspecified: Secondary | ICD-10-CM | POA: Insufficient documentation

## 2016-03-15 DIAGNOSIS — E7800 Pure hypercholesterolemia, unspecified: Secondary | ICD-10-CM | POA: Insufficient documentation

## 2016-03-15 DIAGNOSIS — K8000 Calculus of gallbladder with acute cholecystitis without obstruction: Secondary | ICD-10-CM | POA: Insufficient documentation

## 2016-03-15 DIAGNOSIS — K8020 Calculus of gallbladder without cholecystitis without obstruction: Secondary | ICD-10-CM

## 2016-03-15 LAB — BASIC METABOLIC PANEL
BUN: 12 mg/dL (ref 7–25)
CHLORIDE: 107 mmol/L (ref 98–110)
CO2: 25 mmol/L (ref 20–31)
CREATININE: 0.64 mg/dL (ref 0.50–0.99)
Calcium: 8.9 mg/dL (ref 8.6–10.4)
Glucose, Bld: 107 mg/dL — ABNORMAL HIGH (ref 65–99)
Potassium: 3.3 mmol/L — ABNORMAL LOW (ref 3.5–5.3)
Sodium: 142 mmol/L (ref 135–146)

## 2016-03-15 MED ORDER — ATORVASTATIN CALCIUM 10 MG PO TABS: 10.0000 mg | ORAL_TABLET | Freq: Every day | ORAL | 5 refills | Status: DC

## 2016-03-15 MED ORDER — IBUPROFEN 800 MG PO TABS: 800.0000 mg | ORAL_TABLET | Freq: Two times a day (BID) | ORAL | 2 refills | Status: DC | PRN

## 2016-03-15 MED ORDER — AMLODIPINE BESYLATE 10 MG PO TABS: 10.0000 mg | ORAL_TABLET | Freq: Every day | ORAL | 5 refills | Status: DC

## 2016-03-15 MED FILL — IBUPROFEN 800 MG TABLET: 800 | 30 days supply | Qty: 60 | Fill #0

## 2016-03-15 MED FILL — ?AMLODIPINE BESYLATE 10 MG: 10 | 30 days supply | Qty: 30 | Fill #0

## 2016-03-15 MED FILL — ONDANSETRON ODT 4 MG TABLET: 4 | 6 days supply | Qty: 20 | Fill #0

## 2016-03-15 MED FILL — ATORVASTATIN 10 MG TABLET: 10 | 30 days supply | Qty: 30 | Fill #0

## 2016-03-15 NOTE — Progress Notes (Signed)
Subjective:  Patient ID: Stephanie Juarez, female    DOB: 11/23/1950  Age: 65 y.o. MRN: [id number]  CC: Abdominal Pain; Hypertension; and Cholelithiasis   HPI Stephanie Juarez 65 year old female with a history of obesity, hypertension who presents today requesting a referral to general surgery for cholecystectomy. She had presented to the ED 3 days ago with right upper quadrant pain and abdominal ultrasound was in keeping with cholelithiasis. She has abstained from fatty foods and reports the pain at this time is a 2/10; she denies nausea or vomiting.  She is requesting refills of all medications; endorses noncompliance with Lipitor but promises to do better. She is not up-to-date on her complete physical exam.  Past Medical History:  Diagnosis Date  . Arthritis   . Hypertension     Past Surgical History:  Procedure Laterality Date  . MANDIBLE SURGERY Left 1981   impacted teeth  . SHOULDER ARTHROSCOPY Left 2001   for cyst    Allergies  Allergen Reactions  . Tramadol Itching     Outpatient Medications Prior to Visit  Medication Sig Dispense Refill  . lurasidone (LATUDA) 40 MG TABS tablet Take 40 mg by mouth daily with breakfast.    . tetrahydrozoline (VISINE) 0.05 % ophthalmic solution Place 1 drop into both eyes daily as needed (dry eyes).    Marland Kitchen. amLODipine (NORVASC) 10 MG tablet Take 1 tablet (10 mg total) by mouth daily. 30 tablet 5  . atorvastatin (LIPITOR) 10 MG tablet Take 1 tablet (10 mg total) by mouth daily. 90 tablet 3  . ibuprofen (ADVIL,MOTRIN) 800 MG tablet Take 1 tablet (800 mg total) by mouth every 12 (twelve) hours as needed. (Patient taking differently: Take 800 mg by mouth every 12 (twelve) hours as needed for mild pain. ) 60 tablet 2  . diclofenac sodium (VOLTAREN) 1 % GEL Apply 1 application topically 4 (four) times daily. (Patient not taking: Reported on 03/15/2016) 100 g 0  . HYDROcodone-acetaminophen (NORCO/VICODIN) 5-325 MG tablet Take 2 tablets by mouth  every 4 (four) hours as needed. (Patient not taking: Reported on 03/15/2016) 20 tablet 0  . ondansetron (ZOFRAN ODT) 4 MG disintegrating tablet Take 1 tablet (4 mg total) by mouth every 8 (eight) hours as needed for nausea or vomiting. (Patient not taking: Reported on 03/15/2016) 20 tablet 0   No facility-administered medications prior to visit.     ROS Review of Systems  Constitutional: Negative for activity change, appetite change and fatigue.  HENT: Negative for congestion, sinus pressure and sore throat.   Eyes: Negative for visual disturbance.  Respiratory: Negative for cough, chest tightness, shortness of breath and wheezing.   Cardiovascular: Negative for chest pain and palpitations.  Gastrointestinal: Positive for abdominal pain. Negative for abdominal distention and constipation.  Endocrine: Negative for polydipsia.  Genitourinary: Negative for dysuria and frequency.  Musculoskeletal: Negative for arthralgias and back pain.  Skin: Negative for rash.  Neurological: Negative for tremors, light-headedness and numbness.  Hematological: Does not bruise/bleed easily.  Psychiatric/Behavioral: Negative for agitation and behavioral problems.    Objective:  BP (!) 158/83 (BP Location: Right Arm, Patient Position: Sitting, Cuff Size: Large)   Pulse 86   Temp 98.3 F (36.8 C) (Oral)   Ht 5\' 4"  (1.626 m)   Wt 257 lb 6.4 oz (116.8 kg)   SpO2 95%   BMI 44.18 kg/m   BP/Weight 03/15/2016 03/12/2016 11/18/2015  Systolic BP 158 148 114  Diastolic BP 83 63 72  Wt. (Lbs) 257.4 253  255.4  BMI 44.18 43.43 43.82      Physical Exam  Constitutional: She is oriented to person, place, and time. She appears well-developed and well-nourished.  Cardiovascular: Normal rate, normal heart sounds and intact distal pulses.   No murmur heard. Pulmonary/Chest: Effort normal and breath sounds normal. She has no wheezes. She has no rales. She exhibits no tenderness.  Abdominal: Soft. Bowel sounds are  normal. She exhibits no distension and no mass. There is no tenderness.  Musculoskeletal: Normal range of motion.  Neurological: She is alert and oriented to person, place, and time.     CMP Latest Ref Rng & Units 03/12/2016 05/13/2015 07/29/2013  Glucose 65 - 99 mg/dL 409(W140(H) 80 81  BUN 6 - 20 mg/dL 13 13 10   Creatinine 0.44 - 1.00 mg/dL 1.190.91 1.470.65 8.290.60  Sodium 135 - 145 mmol/L 140 137 143  Potassium 3.5 - 5.1 mmol/L 2.9(L) 4.2 3.7  Chloride 101 - 111 mmol/L 105 100 104  CO2 22 - 32 mmol/L 26 25 27   Calcium 8.9 - 10.3 mg/dL 9.3 9.2 9.4  Total Protein 6.5 - 8.1 g/dL 7.9 - 7.9  Total Bilirubin 0.3 - 1.2 mg/dL 0.5 - 0.7  Alkaline Phos 38 - 126 U/L 65 - 70  AST 15 - 41 U/L 20 - 18  ALT 14 - 54 U/L 13(L) - 13    Lipid Panel     Component Value Date/Time   CHOL 206 (H) 05/20/2015 0917   TRIG 67 05/20/2015 0917   HDL 60 05/20/2015 0917   CHOLHDL 3.4 05/20/2015 0917   VLDL 13 05/20/2015 0917   LDLCALC 133 (H) 05/20/2015 0917    CLINICAL DATA:  Right upper quadrant pain  EXAM: US ABDOMEN LIMITED - RIGHT UPPER QUADRANT  COMPARISON:  CT from earlier in the same day  FINDINGS: Gallbladder:  Multiple gallstones are noted with evidence of wall thickening similar to that noted on prior CT examination.  Common bile duct:  Diameter: 3.7 mm.  Liver:  No focal lesion identified. Within normal limits in parenchymal echogenicity.  IMPRESSION: Cholelithiasis and gallbladder wall thickening. A negative sonographic Eulah PontMurphy sign is noted. This would be consistent with acute cholecystitis however in the appropriate clinical setting.   Electronically Signed   By: Alcide CleverMark  Lukens M.D.   On: 03/12/2016 14:46  Assessment & Plan:   1. Essential hypertension Controlled - amLODipine (NORVASC) 10 MG tablet; Take 1 tablet (10 mg total) by mouth daily.  Dispense: 30 tablet; Refill: 5  2. Calculus of gallbladder without cholecystitis without obstruction Minimal abdominal symptoms  at this time - Ambulatory referral to General Surgery  3. Hypokalemia - Basic Metabolic Panel  4. Pure hypercholesterolemia Uncontrolled due to noncompliance Low-cholesterol diet emphasized - atorvastatin (LIPITOR) 10 MG tablet; Take 1 tablet (10 mg total) by mouth daily.  Dispense: 90 tablet; Refill: 5   Meds ordered this encounter  Medications  . amLODipine (NORVASC) 10 MG tablet    Sig: Take 1 tablet (10 mg total) by mouth daily.    Dispense:  30 tablet    Refill:  5  . atorvastatin (LIPITOR) 10 MG tablet    Sig: Take 1 tablet (10 mg total) by mouth daily.    Dispense:  90 tablet    Refill:  5  . ibuprofen (ADVIL,MOTRIN) 800 MG tablet    Sig: Take 1 tablet (800 mg total) by mouth every 12 (twelve) hours as needed.    Dispense:  60 tablet    Refill:  2    Follow-up: Return in about 1 month (around 04/14/2016) for complete physical exam.   Jaclyn Shaggy MD

## 2016-03-16 ENCOUNTER — Other Ambulatory Visit: Payer: Self-pay | Admitting: Family Medicine

## 2016-03-16 DIAGNOSIS — E876 Hypokalemia: Secondary | ICD-10-CM | POA: Insufficient documentation

## 2016-03-16 MED ORDER — POTASSIUM CHLORIDE ER 10 MEQ PO TBCR: 10.0000 meq | EXTENDED_RELEASE_TABLET | Freq: Every day | ORAL | 1 refills | Status: DC

## 2016-03-16 MED FILL — POTASSIUM CL 10 MEQ TAB SA: 10 | 30 days supply | Qty: 30 | Fill #0

## 2016-03-18 ENCOUNTER — Telehealth: Payer: Self-pay

## 2016-03-18 NOTE — Telephone Encounter (Signed)
Writer called patient to discuss lab results per Dr. Venetia NightAmao.  Patient states understanding and will pick up potassium supplement.

## 2016-03-23 ENCOUNTER — Ambulatory Visit: Payer: Self-pay | Attending: Internal Medicine

## 2016-03-23 ENCOUNTER — Ambulatory Visit: Payer: Self-pay | Admitting: Surgery

## 2016-03-24 ENCOUNTER — Ambulatory Visit (INDEPENDENT_AMBULATORY_CARE_PROVIDER_SITE_OTHER): Payer: Self-pay | Admitting: Surgery

## 2016-03-24 ENCOUNTER — Encounter: Payer: Self-pay | Admitting: Surgery

## 2016-03-24 VITALS — BP 171/82 | HR 82 | Temp 98.4°F | Ht 64.0 in | Wt 254.0 lb

## 2016-03-24 DIAGNOSIS — K8020 Calculus of gallbladder without cholecystitis without obstruction: Secondary | ICD-10-CM

## 2016-03-24 NOTE — Progress Notes (Signed)
03/24/2016  Reason for Visit:  cholelithiasis  History of Present Illness: Stephanie Juarez is a 65 y.o. female who presented to [facility] emergency department on 11/4 with abdominal pain and was diagnosed with cholelithiasis.  At that time she reported having nausea with emesis and watery diarrhea. She had had a previous episode on 10/30 with identical symptoms that only lasted a few hours. She had a CT scan ultrasound which revealed cholelithiasis and some gallbladder wall thickening but no pericholecystic fluid. Her white blood cell count was normal and LFTs were normal as well and was discharged to home with referral for surgery.  Today the patient reports she has not had any symptoms since 11/4.  She has been tolerating a diet without any nausea or vomiting, without constipation or diarrhea, and without any further abdominal pain.  Denies any fevers, chills, chest pain, or shortness of breath.  She reports concern about her insurance and coverage for possible surgery.   Past Medical History: Past Medical History:  Diagnosis Date  . Arthritis   . Hypertension      Past Surgical History: Past Surgical History:  Procedure Laterality Date  . MANDIBLE SURGERY Left 1981   impacted teeth  . SHOULDER ARTHROSCOPY Left 2001   for cyst    Home Medications: Prior to Admission medications   Medication Sig Start Date End Date Taking? Authorizing Provider  amLODipine (NORVASC) 10 MG tablet Take 1 tablet (10 mg total) by mouth daily. 03/15/16  Yes Jaclyn ShaggyEnobong Amao, MD  atorvastatin (LIPITOR) 10 MG tablet Take 1 tablet (10 mg total) by mouth daily. 03/15/16  Yes Jaclyn ShaggyEnobong Amao, MD  HYDROcodone-acetaminophen (NORCO/VICODIN) 5-325 MG tablet Take 2 tablets by mouth every 4 (four) hours as needed. 03/12/16  Yes Francoise CeoWarren S Jones, DO  ibuprofen (ADVIL,MOTRIN) 800 MG tablet Take 1 tablet (800 mg total) by mouth every 12 (twelve) hours as needed. 03/15/16  Yes Jaclyn ShaggyEnobong Amao, MD  lurasidone (LATUDA) 40 MG TABS  tablet Take 40 mg by mouth daily with breakfast.   Yes Historical Provider, MD  ondansetron (ZOFRAN ODT) 4 MG disintegrating tablet Take 1 tablet (4 mg total) by mouth every 8 (eight) hours as needed for nausea or vomiting. 03/12/16  Yes Francoise CeoWarren S Jones, DO  potassium chloride (K-DUR) 10 MEQ tablet Take 1 tablet (10 mEq total) by mouth daily. 03/16/16  Yes Jaclyn ShaggyEnobong Amao, MD  tetrahydrozoline (VISINE) 0.05 % ophthalmic solution Place 1 drop into both eyes daily as needed (dry eyes).   Yes Historical Provider, MD    Allergies: Allergies  Allergen Reactions  . Tramadol Itching    Social History:  reports that she has quit smoking. She has never used smokeless tobacco. She reports that she does not drink alcohol or use drugs.   Family History: Family History  Problem Relation Age of Onset  . Arthritis Mother   . Heart disease Mother   . Heart disease Father     Review of Systems: Review of Systems  Constitutional: Negative for chills and fever.  HENT: Negative for hearing loss.   Eyes: Negative for blurred vision.  Respiratory: Negative for cough and shortness of breath.   Cardiovascular: Negative for chest pain and leg swelling.  Gastrointestinal: Negative for abdominal pain, constipation, diarrhea, heartburn, nausea and vomiting.  Genitourinary: Negative for dysuria and hematuria.  Musculoskeletal: Negative for myalgias.  Skin: Negative for rash.  Neurological: Negative for dizziness.  Psychiatric/Behavioral: Negative for depression.  All other systems reviewed and are negative.   Physical Exam  BP (!) 171/82   Pulse 82   Temp 98.4 F (36.9 C) (Oral)   Ht 5\' 4"  (1.626 m)   Wt 115.2 kg (254 lb)   BMI 43.60 kg/m  CONSTITUTIONAL: No acute distress HEENT:  Normocephalic, atraumatic, extraocular motion intact. NECK: Trachea is midline, and there is no jugular venous distension.  RESPIRATORY:  Lungs are clear, and breath sounds are equal bilaterally. Normal respiratory effort  without pathologic use of accessory muscles. CARDIOVASCULAR: Heart is regular without murmurs, gallops, or rubs. GI: The abdomen is soft, obese, non-distended, non-tender to palpation.  Negative Murphy's sign. There were no palpable masses.  MUSCULOSKELETAL:  Normal muscle strength and tone in all four extremities.  No peripheral edema or cyanosis. SKIN: Skin turgor is normal. There are no pathologic skin lesions.  NEUROLOGIC:  Motor and sensation is grossly normal.  Cranial nerves are grossly intact. PSYCH:  Alert and oriented to person, place and time. Affect is normal.  Laboratory Analysis: Labs from 11/4 showed a total bilirubin of 0.5 ALT of 13 AST of 20 alkaline phosphatase of 65 and a lipase of 23. Her white blood cell count was 6.7 and creatinine 0.91.  Imaging: CT scan on 11/4 showed gallbladder wall thickening with no significant biliary dilatation. Subsequent ultrasound on 11/4 showed cholelithiasis with gallbladder wall thickening but a negative Murphy sign.   Assessment and Plan: This is a 65 y.o. female who presents with cholelithiasis with a biliary colic episode on 11/4 for which she presented to the ED at [facility], but no symptoms since then.  I discussed with the patient that at this point there is no urgency in performing a cholecystectomy. Described to her the spectrum of conservative management including a low-fat diet versus surgical management for biliary colic. The patient does show some concern regarding her insurance and possible coverage for her surgery. After further discussion with her she has decided to attempt conservative management with lifestyle modifications. In the meantime she will contact her insurance company in order to determine coverage and authorization for a possible cholecystectomy in case she were to continue having episodes of biliary colic. If she does have episodes of biliary colic she would like to proceed with surgery on an elective basis. This  plan is very reasonable and have reassured the patient that we will be available for any assistance that she may require. She will contact us in the future pending her decision. In the meantime we will give her information on low-fat diet and lifestyle modifications.  Face-to-face time spent with the patient and care providers was 45 minutes, with more than 50% of the time spent counseling, educating, and coordinating care of the patient.     Howie IllJose Luis Haitham Dolinsky, MD [facility]

## 2016-03-24 NOTE — Patient Instructions (Signed)
Please call our office when you are ready to move forward with scheduling your surgery.    Low-Fat Diet for Pancreatitis or Gallbladder Conditions A low-fat diet can be helpful if you have pancreatitis or a gallbladder condition. With these conditions, your pancreas and gallbladder have trouble digesting fats. A healthy eating plan with less fat will help rest your pancreas and gallbladder and reduce your symptoms. What do I need to know about this diet?  Eat a low-fat diet.  Reduce your fat intake to less than 20-30% of your total daily calories. This is less than 50-60 g of fat per day.  Remember that you need some fat in your diet. Ask your dietician what your daily goal should be.  Choose nonfat and low-fat healthy foods. Look for the words "nonfat," "low fat," or "fat free."  As a guide, look on the label and choose foods with less than 3 g of fat per serving. Eat only one serving.  Avoid alcohol.  Do not smoke. If you need help quitting, talk with your health care provider.  Eat small frequent meals instead of three large heavy meals. What foods can I eat? Grains  Include healthy grains and starches such as potatoes, wheat bread, fiber-rich cereal, and brown rice. Choose whole grain options whenever possible. In adults, whole grains should account for 45-65% of your daily calories. Fruits and Vegetables  Eat plenty of fruits and vegetables. Fresh fruits and vegetables add fiber to your diet. Meats and Other Protein Sources  Eat lean meat such as chicken and pork. Trim any fat off of meat before cooking it. Eggs, fish, and beans are other sources of protein. In adults, these foods should account for 10-35% of your daily calories. Dairy  Choose low-fat milk and dairy options. Dairy includes fat and protein, as well as calcium. Fats and Oils  Limit high-fat foods such as fried foods, sweets, baked goods, sugary drinks. Other  Creamy sauces and condiments, such as mayonnaise, can  add extra fat. Think about whether or not you need to use them, or use smaller amounts or low fat options. What foods are not recommended?  High fat foods, such as:  [employer]aked goods.  Ice cream.  [place] toast.  Sweet rolls.  Pizza.  Cheese bread.  Foods covered with batter, butter, creamy sauces, or cheese.  Fried foods.  Sugary drinks and desserts.  Foods that cause gas or bloating This information is not intended to replace advice given to you by your health care provider. Make sure you discuss any questions you have with your health care provider. Document Released: 04/30/2013 Document Revised: 10/01/2015 Document Reviewed: 04/08/2013 Elsevier Interactive Patient Education  2017 [employer]

## 2016-05-05 MED FILL — IBUPROFEN 800 MG TABLET: 800 | 30 days supply | Qty: 60 | Fill #1

## 2016-05-27 MED FILL — IBUPROFEN 800 MG TABLET: 800 | 30 days supply | Qty: 60 | Fill #2

## 2016-06-01 MED FILL — POTASSIUM CL 10 MEQ TAB SA: 10 | 30 days supply | Qty: 30 | Fill #1

## 2016-06-01 MED FILL — ?AMLODIPINE BESYLATE 10 MG: 10 | 30 days supply | Qty: 30 | Fill #1

## 2016-07-07 ENCOUNTER — Ambulatory Visit: Payer: Self-pay | Attending: Internal Medicine

## 2016-08-01 MED FILL — AMLODIPINE BESYLATE 10 MG T: 10 | 30 days supply | Qty: 30 | Fill #2

## 2016-08-04 ENCOUNTER — Encounter: Payer: Self-pay | Admitting: Family Medicine

## 2016-08-04 ENCOUNTER — Ambulatory Visit: Payer: BLUE CROSS/BLUE SHIELD | Attending: Family Medicine | Admitting: Family Medicine

## 2016-08-04 VITALS — BP 155/84 | HR 71 | Temp 98.2°F | Ht 64.0 in | Wt 252.8 lb

## 2016-08-04 DIAGNOSIS — M1990 Unspecified osteoarthritis, unspecified site: Secondary | ICD-10-CM | POA: Diagnosis not present

## 2016-08-04 DIAGNOSIS — Z79899 Other long term (current) drug therapy: Secondary | ICD-10-CM | POA: Insufficient documentation

## 2016-08-04 DIAGNOSIS — I10 Essential (primary) hypertension: Secondary | ICD-10-CM | POA: Diagnosis not present

## 2016-08-04 DIAGNOSIS — M79645 Pain in left finger(s): Secondary | ICD-10-CM

## 2016-08-04 DIAGNOSIS — E876 Hypokalemia: Secondary | ICD-10-CM | POA: Diagnosis not present

## 2016-08-04 DIAGNOSIS — G8929 Other chronic pain: Secondary | ICD-10-CM

## 2016-08-04 MED ORDER — POTASSIUM CHLORIDE ER 10 MEQ PO TBCR: 10.0000 meq | EXTENDED_RELEASE_TABLET | Freq: Every day | ORAL | 6 refills | Status: DC

## 2016-08-04 MED ORDER — LISINOPRIL 5 MG PO TABS: 5.0000 mg | ORAL_TABLET | Freq: Every day | ORAL | 6 refills | Status: DC

## 2016-08-04 MED ORDER — AMLODIPINE BESYLATE 10 MG PO TABS: 10.0000 mg | ORAL_TABLET | Freq: Every day | ORAL | 6 refills | Status: DC

## 2016-08-04 MED ORDER — IBUPROFEN 800 MG PO TABS
800.0000 mg | ORAL_TABLET | Freq: Two times a day (BID) | ORAL | 2 refills | Status: DC | PRN
Start: 2016-08-04 — End: 2016-09-27

## 2016-08-04 NOTE — Progress Notes (Signed)
Subjective:  Patient ID: Stephanie Juarez, female    DOB: 1950-05-29  Age: 66 y.o. MRN: [id number]  CC: Hypertension; thumb pain (left thumb); and Arthritis   HPI Stephanie Juarez presents for a follow-up of hypertension and hypokalemia and endorses compliance with her medications however her blood pressure is elevated. She does not exercise regularly but has been working on low sodium diet.  She complains of a two-month history of intermittent left thumb pain worse when she flexes her thumb and denies a history of trauma. Denies numbness in her hand and has not been dropping things. Takes Tylenol which does not help her symptoms.  Past Medical History:  Diagnosis Date  . Arthritis   . Hypertension     Past Surgical History:  Procedure Laterality Date  . MANDIBLE SURGERY Left 1981   impacted teeth  . SHOULDER ARTHROSCOPY Left 2001   for cyst    Allergies  Allergen Reactions  . Tramadol Itching     Outpatient Medications Prior to Visit  Medication Sig Dispense Refill  . atorvastatin (LIPITOR) 10 MG tablet Take 1 tablet (10 mg total) by mouth daily. 90 tablet 5  . lurasidone (LATUDA) 40 MG TABS tablet Take 40 mg by mouth daily with breakfast.    . ondansetron (ZOFRAN ODT) 4 MG disintegrating tablet Take 1 tablet (4 mg total) by mouth every 8 (eight) hours as needed for nausea or vomiting. 20 tablet 0  . amLODipine (NORVASC) 10 MG tablet Take 1 tablet (10 mg total) by mouth daily. 30 tablet 5  . HYDROcodone-acetaminophen (NORCO/VICODIN) 5-325 MG tablet Take 2 tablets by mouth every 4 (four) hours as needed. (Patient not taking: Reported on 08/04/2016) 20 tablet 0  . tetrahydrozoline (VISINE) 0.05 % ophthalmic solution Place 1 drop into both eyes daily as needed (dry eyes).    Marland Kitchen ibuprofen (ADVIL,MOTRIN) 800 MG tablet Take 1 tablet (800 mg total) by mouth every 12 (twelve) hours as needed. (Patient not taking: Reported on 08/04/2016) 60 tablet 2  . potassium chloride (K-DUR) 10 MEQ  tablet Take 1 tablet (10 mEq total) by mouth daily. (Patient not taking: Reported on 08/04/2016) 30 tablet 1   No facility-administered medications prior to visit.     ROS Review of Systems  Constitutional: Negative for activity change, appetite change and fatigue.  HENT: Negative for congestion, sinus pressure and sore throat.   Eyes: Negative for visual disturbance.  Respiratory: Negative for cough, chest tightness, shortness of breath and wheezing.   Cardiovascular: Negative for chest pain and palpitations.  Gastrointestinal: Negative for abdominal distention, abdominal pain and constipation.  Endocrine: Negative for polydipsia.  Genitourinary: Negative for dysuria and frequency.  Musculoskeletal:       See hpi  Skin: Negative for rash.  Neurological: Negative for tremors, light-headedness and numbness.  Hematological: Does not bruise/bleed easily.  Psychiatric/Behavioral: Negative for agitation and behavioral problems.    Objective:  BP (!) 155/84 (BP Location: Right Arm, Patient Position: Sitting, Cuff Size: Large)   Pulse 71   Temp 98.2 F (36.8 C) (Oral)   Ht _0  (1.626 m)   Wt 252 lb 12.8 oz (114.7 kg)   SpO2 97%   BMI 43.39 kg/m   BP/Weight 08/04/2016 03/24/2016 83/07/8248  Systolic BP 539 767 341  Diastolic BP 84 82 83  Wt. (Lbs) 252.8 254 257.4  BMI 43.39 43.6 44.18      Physical Exam  Constitutional: She is oriented to person, place, and time. She appears well-developed and  well-nourished.  Cardiovascular: Normal rate, normal heart sounds and intact distal pulses.   No murmur heard. Pulmonary/Chest: Effort normal and breath sounds normal. She has no wheezes. She has no rales. She exhibits no tenderness.  Abdominal: Soft. Bowel sounds are normal. She exhibits no distension and no mass. There is no tenderness.  Musculoskeletal: Normal range of motion. She exhibits tenderness (tenderness in the MCP joint of left thumb and on flexion of the left thumb.  Able  to make a fist in left hand. Right hand is normal). She exhibits no edema.  Neurological: She is alert and oriented to person, place, and time.  Skin: Skin is warm and dry.  Psychiatric: She has a normal mood and affect.     CMP Latest Ref Rng & Units 03/15/2016 03/12/2016 05/13/2015  Glucose 65 - 99 mg/dL 107(H) 140(H) 80  BUN 7 - 25 mg/dL _0 Creatinine 0.50 - 0.99 mg/dL 0.64 0.91 0.65  Sodium 135 - 146 mmol/L 142 140 137  Potassium 3.5 - 5.3 mmol/L 3.3(L) 2.9(L) 4.2  Chloride 98 - 110 mmol/L 107 105 100  CO2 20 - 31 mmol/L _1 Calcium 8.6 - 10.4 mg/dL 8.9 9.3 9.2  Total Protein 6.5 - 8.1 g/dL - 7.9 -  Total Bilirubin 0.3 - 1.2 mg/dL - 0.5 -  Alkaline Phos 38 - 126 U/L - 65 -  AST 15 - 41 U/L - 20 -  ALT 14 - 54 U/L - 13(L) -    Lipid Panel     Component Value Date/Time   CHOL 206 (H) 05/20/2015 0917   TRIG 67 05/20/2015 0917   HDL 60 05/20/2015 0917   CHOLHDL 3.4 05/20/2015 0917   VLDL 13 05/20/2015 0917   LDLCALC 133 (H) 05/20/2015 0917   .   Assessment & Plan:   1. Essential hypertension Uncontrolled Lisinopril added to her regimen - amLODipine (NORVASC) 10 MG tablet; Take 1 tablet (10 mg total) by mouth daily.  Dispense: 30 tablet; Refill: 6 - CMP14+EGFR; Future - Lipid panel; Future - lisinopril (PRINIVIL,ZESTRIL) 5 MG tablet; Take 1 tablet (5 mg total) by mouth daily.  Dispense: 30 tablet; Refill: 6  2. Hypokalemia - potassium chloride (K-DUR) 10 MEQ tablet; Take 1 tablet (10 mEq total) by mouth daily.  Dispense: 30 tablet; Refill: 6  3. Chronic pain of left thumb Likely underlying osteoarthritis - ibuprofen (ADVIL,MOTRIN) 800 MG tablet; Take 1 tablet (800 mg total) by mouth every 12 (twelve) hours as needed.  Dispense: 60 tablet; Refill: 2   Meds ordered this encounter  Medications  . amLODipine (NORVASC) 10 MG tablet    Sig: Take 1 tablet (10 mg total) by mouth daily.    Dispense:  30 tablet    Refill:  6  . potassium chloride (K-DUR) 10  MEQ tablet    Sig: Take 1 tablet (10 mEq total) by mouth daily.    Dispense:  30 tablet    Refill:  6  . ibuprofen (ADVIL,MOTRIN) 800 MG tablet    Sig: Take 1 tablet (800 mg total) by mouth every 12 (twelve) hours as needed.    Dispense:  60 tablet    Refill:  2  . lisinopril (PRINIVIL,ZESTRIL) 5 MG tablet    Sig: Take 1 tablet (5 mg total) by mouth daily.    Dispense:  30 tablet    Refill:  6    Follow-up: 6 mos f/u on HTN   Arnoldo Morale MD

## 2016-08-04 NOTE — Progress Notes (Signed)
Refills on potassium and pain meds

## 2016-08-08 MED FILL — IBUPROFEN 800 MG TABLET: 800 | 30 days supply | Qty: 60 | Fill #0

## 2016-08-08 MED FILL — POTASSIUM CL 10 MEQ TAB SA: 10 | 30 days supply | Qty: 30 | Fill #0

## 2016-08-08 MED FILL — ?LISINOPRIL 5 MG TABLET: 5 | 30 days supply | Qty: 30 | Fill #0

## 2016-08-09 ENCOUNTER — Ambulatory Visit: Payer: BLUE CROSS/BLUE SHIELD | Attending: Family Medicine

## 2016-08-09 DIAGNOSIS — I10 Essential (primary) hypertension: Secondary | ICD-10-CM

## 2016-08-09 NOTE — Progress Notes (Signed)
Patient here for lab visit only 

## 2016-08-10 LAB — LIPID PANEL
CHOL/HDL RATIO: 3 ratio (ref 0.0–4.4)
Cholesterol, Total: 201 mg/dL — ABNORMAL HIGH (ref 100–199)
HDL: 67 mg/dL (ref >39)
LDL Calculated: 118 mg/dL — ABNORMAL HIGH (ref 0–99)
Triglycerides: 80 mg/dL (ref 0–149)
VLDL Cholesterol Cal: 16 mg/dL (ref 5–40)

## 2016-08-10 LAB — CMP14+EGFR
A/G RATIO: 1.4 (ref 1.2–2.2)
ALBUMIN: 4.2 g/dL (ref 3.6–4.8)
ALT: 15 IU/L (ref 0–32)
AST: 20 IU/L (ref 0–40)
Alkaline Phosphatase: 77 IU/L (ref 39–117)
BUN/Creatinine Ratio: 17 (ref 12–28)
BUN: 12 mg/dL (ref 8–27)
Bilirubin Total: 0.6 mg/dL (ref 0.0–1.2)
CALCIUM: 9 mg/dL (ref 8.7–10.3)
CO2: 22 mmol/L (ref 18–29)
Chloride: 102 mmol/L (ref 96–106)
Creatinine, Ser: 0.71 mg/dL (ref 0.57–1.00)
GFR calc Af Amer: 105 mL/min/{1.73_m2} (ref >59)
GFR, EST NON AFRICAN AMERICAN: 91 mL/min/{1.73_m2} (ref >59)
Globulin, Total: 3 g/dL (ref 1.5–4.5)
Glucose: 88 mg/dL (ref 65–99)
POTASSIUM: 3.8 mmol/L (ref 3.5–5.2)
Sodium: 141 mmol/L (ref 134–144)
TOTAL PROTEIN: 7.2 g/dL (ref 6.0–8.5)

## 2016-08-12 ENCOUNTER — Telehealth: Payer: Self-pay

## 2016-08-12 ENCOUNTER — Other Ambulatory Visit: Payer: Self-pay | Admitting: Family Medicine

## 2016-08-12 DIAGNOSIS — E7800 Pure hypercholesterolemia, unspecified: Secondary | ICD-10-CM

## 2016-08-12 MED ORDER — ATORVASTATIN CALCIUM 20 MG PO TABS: 20.0000 mg | ORAL_TABLET | Freq: Every day | ORAL | 11 refills | Status: DC

## 2016-08-12 NOTE — Telephone Encounter (Signed)
Writer called patient and LVM requesting her to call back to discuss lab results.

## 2016-08-12 NOTE — Telephone Encounter (Signed)
Pt returned nurse call went over lab results pt doesn't have any questions or concerns

## 2016-08-12 NOTE — Telephone Encounter (Signed)
-----   Message from Dessa Phi, MD sent at 08/12/2016  1:13 PM EDT ----- Normal metabolic panel Elevated cholesterol, patient advised to start Lipitor 20 mg daily (10 yr CVD risk 8 %, moderate to high intensity statin recommended)

## 2016-09-26 ENCOUNTER — Telehealth: Payer: Self-pay | Admitting: Family Medicine

## 2016-09-26 DIAGNOSIS — M79645 Pain in left finger(s): Principal | ICD-10-CM

## 2016-09-26 DIAGNOSIS — G8929 Other chronic pain: Secondary | ICD-10-CM

## 2016-09-26 NOTE — Telephone Encounter (Signed)
Patient called the office to request medication refills for her bp medication and potassium chloride (K-DUR) 10 MEQ tablet. Please send it to our pharmacy.  Thank you.

## 2016-09-26 NOTE — Telephone Encounter (Signed)
Patient called the office to request medication refill for ibuprofen (ADVIL,MOTRIN) 800 MG tablet.  Thank you.

## 2016-09-27 MED ORDER — IBUPROFEN 800 MG PO TABS: 800.0000 mg | ORAL_TABLET | Freq: Two times a day (BID) | ORAL | 2 refills | Status: DC | PRN

## 2016-09-27 MED FILL — IBUPROFEN 800 MG TABLET: 800 | 30 days supply | Qty: 60 | Fill #0

## 2016-09-27 NOTE — Telephone Encounter (Signed)
Called and informed patient that rx has been refilled. Thank you.

## 2016-09-27 NOTE — Telephone Encounter (Signed)
Patient needs to contact pharmacy for refills - Dr. Venetia NightAmao gave her 6 refills on the last script.

## 2016-09-27 NOTE — Telephone Encounter (Signed)
Refilled

## 2016-09-30 MED FILL — ?LISINOPRIL 5 MG TABLET: 5 | 30 days supply | Qty: 30 | Fill #1

## 2016-09-30 MED FILL — POTASSIUM CL 10 MEQ TAB SA: 10 | 30 days supply | Qty: 30 | Fill #1

## 2016-11-16 MED FILL — IBUPROFEN 800 MG TABLET: 800 | 30 days supply | Qty: 60 | Fill #1

## 2016-11-16 MED FILL — ?LISINOPRIL 5 MG TABLET: 5 | 30 days supply | Qty: 30 | Fill #2

## 2017-01-04 ENCOUNTER — Ambulatory Visit: Payer: BLUE CROSS/BLUE SHIELD | Attending: Family Medicine | Admitting: Family Medicine

## 2017-01-04 ENCOUNTER — Encounter: Payer: Self-pay | Admitting: Family Medicine

## 2017-01-04 VITALS — BP 170/79 | HR 76 | Temp 98.2°F | Ht 64.0 in | Wt 252.0 lb

## 2017-01-04 DIAGNOSIS — E7800 Pure hypercholesterolemia, unspecified: Secondary | ICD-10-CM | POA: Diagnosis not present

## 2017-01-04 DIAGNOSIS — F3178 Bipolar disorder, in full remission, most recent episode mixed: Secondary | ICD-10-CM | POA: Diagnosis not present

## 2017-01-04 DIAGNOSIS — Z131 Encounter for screening for diabetes mellitus: Secondary | ICD-10-CM

## 2017-01-04 DIAGNOSIS — Z79899 Other long term (current) drug therapy: Secondary | ICD-10-CM | POA: Diagnosis not present

## 2017-01-04 DIAGNOSIS — E669 Obesity, unspecified: Secondary | ICD-10-CM | POA: Diagnosis not present

## 2017-01-04 DIAGNOSIS — M25562 Pain in left knee: Secondary | ICD-10-CM | POA: Insufficient documentation

## 2017-01-04 DIAGNOSIS — F3170 Bipolar disorder, currently in remission, most recent episode unspecified: Secondary | ICD-10-CM | POA: Diagnosis not present

## 2017-01-04 DIAGNOSIS — I10 Essential (primary) hypertension: Secondary | ICD-10-CM | POA: Diagnosis not present

## 2017-01-04 DIAGNOSIS — E785 Hyperlipidemia, unspecified: Secondary | ICD-10-CM | POA: Diagnosis not present

## 2017-01-04 DIAGNOSIS — E876 Hypokalemia: Secondary | ICD-10-CM | POA: Insufficient documentation

## 2017-01-04 DIAGNOSIS — G8929 Other chronic pain: Secondary | ICD-10-CM

## 2017-01-04 LAB — POCT GLYCOSYLATED HEMOGLOBIN (HGB A1C): Hemoglobin A1C: 4.8

## 2017-01-04 MED ORDER — ATORVASTATIN CALCIUM 20 MG PO TABS: 20.0000 mg | ORAL_TABLET | Freq: Every day | ORAL | 6 refills | Status: DC

## 2017-01-04 MED ORDER — LISINOPRIL 10 MG PO TABS: 10.0000 mg | ORAL_TABLET | Freq: Every day | ORAL | 6 refills | Status: DC

## 2017-01-04 MED ORDER — AMLODIPINE BESYLATE 10 MG PO TABS: 10.0000 mg | ORAL_TABLET | Freq: Every day | ORAL | 6 refills | Status: DC

## 2017-01-04 MED FILL — LISINOPRIL 10 MG TABS: 10 | 30 days supply | Qty: 30 | Fill #0

## 2017-01-04 MED FILL — ?ATORVASTATIN 20 MG TABLET: 20 | 30 days supply | Qty: 30 | Fill #0

## 2017-01-04 MED FILL — AMLODIPINE BESYLATE 10 MG T: 10 | 30 days supply | Qty: 30 | Fill #0

## 2017-01-04 NOTE — Patient Instructions (Signed)

## 2017-01-04 NOTE — Progress Notes (Signed)
Subjective:  Patient ID: Stephanie Juarez, female    DOB: Feb 08, 1951  Age: 66 y.o. MRN: [id number]  CC: Knee Pain   HPI Stephanie Juarez is a 66 year old female with a history of hypertension, hyperlipidemia, obesity, chronic left knee pain, bipolar disorder who presents today for a follow-up visit.  Her blood pressure is elevated and she has been taking only lisinopril but not amlodipine due to the fact that she assumed the latter had been discontinued. She does have hypokalemia for which she remains on chronic potassium but complains of loose bowels with the potassium pills. She does not exercise but tries to adhere to low-sodium diet.  Tolerated her statin and denies any myalgias or adverse effect from it.  She does have intermittent left posterior knee pain worse when going up and down the stairs for which she takes ibuprofen. 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  She was previously followed at [facility] and was on [place] for bipolar disorder but she decided against going back there and discontinued Latuda.  Informs me she prays and feels fine.  Past Medical History:  Diagnosis Date  . Arthritis   . Hypertension     Past Surgical History:  Procedure Laterality Date  . MANDIBLE SURGERY Left 1981   impacted teeth  . SHOULDER ARTHROSCOPY Left 2001   for cyst    Allergies  Allergen Reactions  . Tramadol Itching     Outpatient Medications Prior to Visit  Medication Sig Dispense Refill  . ibuprofen (ADVIL,MOTRIN) 800 MG tablet Take 1 tablet (800 mg total) by mouth every 12 (twelve) hours as needed. 60 tablet 2  . tetrahydrozoline (VISINE) 0.05 % ophthalmic solution Place 1 drop into both eyes daily as needed (dry eyes).    Marland Kitchen atorvastatin (LIPITOR) 20 MG tablet Take 1 tablet (20 mg total) by mouth daily. 30 tablet 11  . lisinopril (PRINIVIL,ZESTRIL) 5 MG tablet Take 1 tablet (5 mg total) by mouth daily.  30 tablet 6  . potassium chloride (K-DUR) 10 MEQ tablet Take 1 tablet (10 mEq total) by mouth daily. 30 tablet 6  . HYDROcodone-acetaminophen (NORCO/VICODIN) 5-325 MG tablet Take 2 tablets by mouth every 4 (four) hours as needed. (Patient not taking: Reported on 08/04/2016) 20 tablet 0  . lurasidone (LATUDA) 40 MG TABS tablet Take 40 mg by mouth daily with breakfast.    . ondansetron (ZOFRAN ODT) 4 MG disintegrating tablet Take 1 tablet (4 mg total) by mouth every 8 (eight) hours as needed for nausea or vomiting. (Patient not taking: Reported on 01/04/2017) 20 tablet 0  . amLODipine (NORVASC) 10 MG tablet Take 1 tablet (10 mg total) by mouth daily. (Patient not taking: Reported on 01/04/2017) 30 tablet 6   No facility-administered medications prior to visit.     ROS Review of Systems  Constitutional: Negative for activity change, appetite change and fatigue.  HENT: Negative for congestion, sinus pressure and sore throat.   Eyes: Negative for visual disturbance.  Respiratory: Negative for cough, chest tightness, shortness of breath and wheezing.   Cardiovascular: Negative for chest pain and palpitations.  Gastrointestinal: Negative for abdominal distention, abdominal pain and constipation.  Endocrine: Negative for polydipsia.  Genitourinary: Negative for dysuria and frequency.  Musculoskeletal:       See hpi  Skin: Negative for rash.  Neurological: Negative for tremors, light-headedness and numbness.  Hematological: Does not bruise/bleed  easily.  Psychiatric/Behavioral: Negative for agitation and behavioral problems.    Objective:  BP (!) 170/79   Pulse 76   Temp 98.2 F (36.8 C) (Oral)   Ht _0  (1.626 m)   Wt 252 lb (114.3 kg)   SpO2 98%   BMI 43.26 kg/m   BP/Weight 01/04/2017 08/04/2016 29/79/8921  Systolic BP 194 174 081  Diastolic BP 79 84 82  Wt. (Lbs) 252 252.8 254  BMI 43.26 43.39 43.6      Physical Exam  Constitutional: She is oriented to person, place, and time.  She appears well-developed and well-nourished.  Cardiovascular: Normal rate, normal heart sounds and intact distal pulses.   No murmur heard. Pulmonary/Chest: Effort normal and breath sounds normal. She has no wheezes. She has no rales. She exhibits no tenderness.  Abdominal: Soft. Bowel sounds are normal. She exhibits no distension and no mass. There is no tenderness.  Musculoskeletal: Normal range of motion. She exhibits tenderness (slight tenderness in left popliteal fossa on flexion and extension). She exhibits no edema.  Neurological: She is alert and oriented to person, place, and time.  Skin: Skin is warm and dry.  Psychiatric: She has a normal mood and affect.     CMP Latest Ref Rng & Units 08/09/2016 03/15/2016 03/12/2016  Glucose 65 - 99 mg/dL 88 107(H) 140(H)  BUN 8 - 27 mg/dL _1 Creatinine 0.57 - 1.00 mg/dL 0.71 0.64 0.91  Sodium 134 - 144 mmol/L 141 142 140  Potassium 3.5 - 5.2 mmol/L 3.8 3.3(L) 2.9(L)  Chloride 96 - 106 mmol/L 102 107 105  CO2 18 - 29 mmol/L _2 Calcium 8.7 - 10.3 mg/dL 9.0 8.9 9.3  Total Protein 6.0 - 8.5 g/dL 7.2 - 7.9  Total Bilirubin 0.0 - 1.2 mg/dL 0.6 - 0.5  Alkaline Phos 39 - 117 IU/L 77 - 65  AST 0 - 40 IU/L 20 - 20  ALT 0 - 32 IU/L 15 - 13(L)    Lipid Panel     Component Value Date/Time   CHOL 201 (H) 08/09/2016 0834   TRIG 80 08/09/2016 0834   HDL 67 08/09/2016 0834   CHOLHDL 3.0 08/09/2016 0834   CHOLHDL 3.4 05/20/2015 0917   VLDL 13 05/20/2015 0917   LDLCALC 118 (H) 08/09/2016 0834    Lab Results  Component Value Date   HGBA1C 4.8 01/04/2017    Assessment & Plan:   1. Essential hypertension Uncontrolled due to the fact that she has only been taking lisinopril and not amlodipine Increased dose of lisinopril which will also help with hypokalemia Discontinue hypokalemia due to complaints of diarrhea Low-sodium diet - amLODipine (NORVASC) 10 MG tablet; Take 1 tablet (10 mg total) by mouth daily.  Dispense: 30 tablet;  Refill: 6 - lisinopril (PRINIVIL,ZESTRIL) 10 MG tablet; Take 1 tablet (10 mg total) by mouth daily.  Dispense: 30 tablet; Refill: 6 - CMP14+EGFR  2. Pure hypercholesterolemia Controlled - atorvastatin (LIPITOR) 20 MG tablet; Take 1 tablet (20 mg total) by mouth daily.  Dispense: 30 tablet; Refill: 6  3. Screening for diabetes mellitus A1c of 4.8 - POCT glycosylated hemoglobin (Hb A1C)  4. High risk medication use We'll check CBC due to history of psychotropic medication use - CBC with Differential  5. Bipolar disorder, in full remission, most recent episode mixed [facility]) Currently not taking her Latuda and not following up with psych either Encouraged to keep appointment or inform the clinic in the event that she chooses  to see another mental health professional  6. Knee pain Takes ibuprofen intermittently Declines knee injections due to side effect of weight gain.  Meds ordered this encounter  Medications  . amLODipine (NORVASC) 10 MG tablet    Sig: Take 1 tablet (10 mg total) by mouth daily.    Dispense:  30 tablet    Refill:  6  . lisinopril (PRINIVIL,ZESTRIL) 10 MG tablet    Sig: Take 1 tablet (10 mg total) by mouth daily.    Dispense:  30 tablet    Refill:  6  . atorvastatin (LIPITOR) 20 MG tablet    Sig: Take 1 tablet (20 mg total) by mouth daily.    Dispense:  30 tablet    Refill:  6    Follow-up: Return in about 6 months (around 07/06/2017).   Arnoldo Morale MD

## 2017-01-05 ENCOUNTER — Other Ambulatory Visit: Payer: Self-pay | Admitting: Family Medicine

## 2017-01-05 ENCOUNTER — Telehealth: Payer: Self-pay

## 2017-01-05 DIAGNOSIS — E876 Hypokalemia: Secondary | ICD-10-CM

## 2017-01-05 LAB — CMP14+EGFR
ALBUMIN: 4.4 g/dL (ref 3.6–4.8)
ALK PHOS: 76 IU/L (ref 39–117)
ALT: 13 IU/L (ref 0–32)
AST: 16 IU/L (ref 0–40)
Albumin/Globulin Ratio: 1.3 (ref 1.2–2.2)
BUN/Creatinine Ratio: 21 (ref 12–28)
BUN: 15 mg/dL (ref 8–27)
Bilirubin Total: 0.5 mg/dL (ref 0.0–1.2)
CO2: 29 mmol/L (ref 20–29)
CREATININE: 0.73 mg/dL (ref 0.57–1.00)
Calcium: 9.7 mg/dL (ref 8.7–10.3)
Chloride: 104 mmol/L (ref 96–106)
GFR calc Af Amer: 101 mL/min/{1.73_m2} (ref >59)
GFR calc non Af Amer: 88 mL/min/{1.73_m2} (ref >59)
GLUCOSE: 90 mg/dL (ref 65–99)
Globulin, Total: 3.4 g/dL (ref 1.5–4.5)
Potassium: 3.3 mmol/L — ABNORMAL LOW (ref 3.5–5.2)
Sodium: 144 mmol/L (ref 134–144)
Total Protein: 7.8 g/dL (ref 6.0–8.5)

## 2017-01-05 LAB — CBC WITH DIFFERENTIAL/PLATELET
Basophils Absolute: 0.1 10*3/uL (ref 0.0–0.2)
Basos: 2 %
EOS (ABSOLUTE): 0.3 10*3/uL (ref 0.0–0.4)
EOS: 5 %
HEMATOCRIT: 38.1 % (ref 34.0–46.6)
HEMOGLOBIN: 12 g/dL (ref 11.1–15.9)
IMMATURE GRANULOCYTES: 0 %
Immature Grans (Abs): 0 10*3/uL (ref 0.0–0.1)
LYMPHS: 38 %
Lymphocytes Absolute: 2.1 10*3/uL (ref 0.7–3.1)
MCH: 26.3 pg — ABNORMAL LOW (ref 26.6–33.0)
MCHC: 31.5 g/dL (ref 31.5–35.7)
MCV: 83 fL (ref 79–97)
Monocytes Absolute: 0.4 10*3/uL (ref 0.1–0.9)
Monocytes: 7 %
NEUTROS PCT: 48 %
Neutrophils Absolute: 2.7 10*3/uL (ref 1.4–7.0)
Platelets: 194 10*3/uL (ref 150–379)
RBC: 4.57 x10E6/uL (ref 3.77–5.28)
RDW: 14.2 % (ref 12.3–15.4)
WBC: 5.5 10*3/uL (ref 3.4–10.8)

## 2017-01-05 NOTE — Telephone Encounter (Signed)
Pt was called and a VM was left to go over lab results.

## 2017-01-05 NOTE — Telephone Encounter (Signed)
Pt returned phone call for lab results. 

## 2017-01-19 ENCOUNTER — Ambulatory Visit: Payer: BLUE CROSS/BLUE SHIELD | Attending: Family Medicine

## 2017-01-19 DIAGNOSIS — E876 Hypokalemia: Secondary | ICD-10-CM | POA: Insufficient documentation

## 2017-01-19 NOTE — Progress Notes (Signed)
Patient here for lab visit only 

## 2017-01-20 LAB — BASIC METABOLIC PANEL
BUN / CREAT RATIO: 25 (ref 12–28)
BUN: 16 mg/dL (ref 8–27)
CO2: 22 mmol/L (ref 20–29)
CREATININE: 0.64 mg/dL (ref 0.57–1.00)
Calcium: 9 mg/dL (ref 8.7–10.3)
Chloride: 105 mmol/L (ref 96–106)
GFR, EST AFRICAN AMERICAN: 110 mL/min/{1.73_m2} (ref >59)
GFR, EST NON AFRICAN AMERICAN: 95 mL/min/{1.73_m2} (ref >59)
Glucose: 90 mg/dL (ref 65–99)
Potassium: 3.4 mmol/L — ABNORMAL LOW (ref 3.5–5.2)
Sodium: 144 mmol/L (ref 134–144)

## 2017-01-25 ENCOUNTER — Telehealth: Payer: Self-pay

## 2017-01-25 ENCOUNTER — Other Ambulatory Visit: Payer: Self-pay

## 2017-01-25 NOTE — Telephone Encounter (Signed)
Pt was called and informed of lab results. 

## 2017-01-25 NOTE — Telephone Encounter (Signed)
Pt called states she will start taking potassium, pt would like a new rx. Please f/up

## 2017-03-09 MED FILL — POTASSIUM CL 10 MEQ TAB SA: 10 | 30 days supply | Qty: 30 | Fill #2

## 2017-03-09 MED FILL — AMLODIPINE BESYLATE 10 MG T: 10 | 30 days supply | Qty: 30 | Fill #1

## 2017-03-09 MED FILL — LISINOPRIL 10 MG TABS: 10 | 30 days supply | Qty: 30 | Fill #1

## 2017-03-09 MED FILL — IBUPROFEN 800 MG TABLET: 800 | 30 days supply | Qty: 60 | Fill #2

## 2017-03-09 MED FILL — ?ATORVASTATIN 20 MG TABLET: 20 | 30 days supply | Qty: 30 | Fill #1

## 2017-05-11 MED FILL — LISINOPRIL 10 MG TABS: 10 | 30 days supply | Qty: 30 | Fill #2

## 2017-05-11 MED FILL — IBUPROFEN 800 MG TABLET: 800 | 30 days supply | Qty: 60 | Fill #1

## 2017-06-27 MED FILL — AMLODIPINE BESYLATE 10 MG T: 10 | 30 days supply | Qty: 30 | Fill #2

## 2017-06-27 MED FILL — LISINOPRIL 10 MG TABS: 10 | 30 days supply | Qty: 30 | Fill #3

## 2017-06-30 MED FILL — IBUPROFEN 800 MG TABLET: 800 | 30 days supply | Qty: 60 | Fill #2

## 2017-09-01 MED FILL — AMLODIPINE BESYLATE 10 MG T: 10 | 30 days supply | Qty: 30 | Fill #3

## 2017-09-01 MED FILL — LISINOPRIL 10 MG TABS: 10 | 30 days supply | Qty: 30 | Fill #4

## 2017-09-27 ENCOUNTER — Ambulatory Visit: Payer: BLUE CROSS/BLUE SHIELD | Attending: Family Medicine | Admitting: Family Medicine

## 2017-09-27 ENCOUNTER — Encounter: Payer: Self-pay | Admitting: Family Medicine

## 2017-09-27 VITALS — BP 170/85 | HR 70 | Temp 97.9°F | Ht 64.0 in | Wt 256.0 lb

## 2017-09-27 DIAGNOSIS — I10 Essential (primary) hypertension: Secondary | ICD-10-CM

## 2017-09-27 DIAGNOSIS — G8929 Other chronic pain: Secondary | ICD-10-CM | POA: Diagnosis present

## 2017-09-27 DIAGNOSIS — Z1231 Encounter for screening mammogram for malignant neoplasm of breast: Secondary | ICD-10-CM | POA: Diagnosis not present

## 2017-09-27 DIAGNOSIS — Z1239 Encounter for other screening for malignant neoplasm of breast: Secondary | ICD-10-CM

## 2017-09-27 DIAGNOSIS — M25562 Pain in left knee: Secondary | ICD-10-CM

## 2017-09-27 DIAGNOSIS — Z1159 Encounter for screening for other viral diseases: Secondary | ICD-10-CM | POA: Diagnosis not present

## 2017-09-27 DIAGNOSIS — F3170 Bipolar disorder, currently in remission, most recent episode unspecified: Secondary | ICD-10-CM | POA: Diagnosis not present

## 2017-09-27 DIAGNOSIS — Z79899 Other long term (current) drug therapy: Secondary | ICD-10-CM | POA: Diagnosis not present

## 2017-09-27 DIAGNOSIS — E669 Obesity, unspecified: Secondary | ICD-10-CM | POA: Insufficient documentation

## 2017-09-27 DIAGNOSIS — F3178 Bipolar disorder, in full remission, most recent episode mixed: Secondary | ICD-10-CM | POA: Diagnosis not present

## 2017-09-27 DIAGNOSIS — Z6841 Body Mass Index (BMI) 40.0 and over, adult: Secondary | ICD-10-CM | POA: Diagnosis not present

## 2017-09-27 DIAGNOSIS — E7800 Pure hypercholesterolemia, unspecified: Secondary | ICD-10-CM | POA: Diagnosis not present

## 2017-09-27 MED ORDER — AMLODIPINE BESYLATE 5 MG PO TABS: 5.0000 mg | ORAL_TABLET | Freq: Every day | ORAL | 6 refills | Status: DC

## 2017-09-27 MED ORDER — MELOXICAM 7.5 MG PO TABS: 7.5000 mg | ORAL_TABLET | Freq: Every day | ORAL | 2 refills | Status: DC

## 2017-09-27 MED ORDER — ATORVASTATIN CALCIUM 20 MG PO TABS: 20.0000 mg | ORAL_TABLET | Freq: Every day | ORAL | 6 refills | Status: DC

## 2017-09-27 MED FILL — ATORVASTATIN 20 MG TABLET: 20 | 30 days supply | Qty: 30 | Fill #0

## 2017-09-27 MED FILL — MELOXICAM 7.5 MG TABLET: 7.5 | 30 days supply | Qty: 30 | Fill #0

## 2017-09-27 MED FILL — AMLODIPINE BESYLATE 5 MG TA: 5 | 30 days supply | Qty: 30 | Fill #0

## 2017-09-27 NOTE — Patient Instructions (Signed)

## 2017-09-27 NOTE — Progress Notes (Signed)
Subjective:  Patient ID: Stephanie Juarez, female    DOB: 29-May-1950  Age: 67 y.o. MRN: [id number]  CC: Knee Pain   HPI JANUARY BERGTHOLD is a 67 year old female with a history of hypertension, hyperlipidemia, obesity, chronic left knee pain, bipolar disorder who presents today for a follow-up visit. Her left knee pain which was previously intermittent has worsened over the last 1 month, waking her up at night and is worse in the posterior aspect of her left knee.  She received a cortisone shot several years ago and symptoms have been controlled on ibuprofen up until recently.  Denies recent history of trauma.  Her blood pressure is elevated and she is yet to take her antihypertensive today.  She describes an episode where she passed out and her niece who happens to be a nurse checked her blood pressure and was found to be in the hypotensive range.  Ever since she has been taking amlodipine every other day. She denies chest pains or shortness of breath. Tolerating her statin with no complaints of adverse effects from her medications. With regards to her bipolar disorder she feels she is stable on currently relies on her faith to get her through.  Currently does not take any medications and does not see psychiatry.  Past Medical History:  Diagnosis Date  . Arthritis   . Hypertension     Past Surgical History:  Procedure Laterality Date  . MANDIBLE SURGERY Left 1981   impacted teeth  . SHOULDER ARTHROSCOPY Left 2001   for cyst    Allergies  Allergen Reactions  . Tramadol Itching     Outpatient Medications Prior to Visit  Medication Sig Dispense Refill  . lisinopril (PRINIVIL,ZESTRIL) 10 MG tablet Take 1 tablet (10 mg total) by mouth daily. 30 tablet 6  . tetrahydrozoline (VISINE) 0.05 % ophthalmic solution Place 1 drop into both eyes daily as needed (dry eyes).    Marland Kitchen amLODipine (NORVASC) 10 MG tablet Take 1 tablet (10 mg total) by mouth daily. 30 tablet 6  . atorvastatin (LIPITOR)  20 MG tablet Take 1 tablet (20 mg total) by mouth daily. 30 tablet 6  . ibuprofen (ADVIL,MOTRIN) 800 MG tablet Take 1 tablet (800 mg total) by mouth every 12 (twelve) hours as needed. 60 tablet 2  . HYDROcodone-acetaminophen (NORCO/VICODIN) 5-325 MG tablet Take 2 tablets by mouth every 4 (four) hours as needed. (Patient not taking: Reported on 08/04/2016) 20 tablet 0  . lurasidone (LATUDA) 40 MG TABS tablet Take 40 mg by mouth daily with breakfast.    . ondansetron (ZOFRAN ODT) 4 MG disintegrating tablet Take 1 tablet (4 mg total) by mouth every 8 (eight) hours as needed for nausea or vomiting. (Patient not taking: Reported on 01/04/2017) 20 tablet 0   No facility-administered medications prior to visit.     ROS Review of Systems  Constitutional: Negative for activity change, appetite change and fatigue.  HENT: Negative for congestion, sinus pressure and sore throat.   Eyes: Negative for visual disturbance.  Respiratory: Negative for cough, chest tightness, shortness of breath and wheezing.   Cardiovascular: Negative for chest pain and palpitations.  Gastrointestinal: Negative for abdominal distention, abdominal pain and constipation.  Endocrine: Negative for polydipsia.  Genitourinary: Negative for dysuria and frequency.  Musculoskeletal: Negative for arthralgias and back pain.  Skin: Negative for rash.  Neurological: Negative for tremors, light-headedness and numbness.  Hematological: Does not bruise/bleed easily.  Psychiatric/Behavioral: Negative for agitation and behavioral problems.    Objective:  BP (!) 170/85   Pulse 70   Temp 97.9 F (36.6 C) (Oral)   Ht '5\' 4"'  (1.626 m)   Wt 256 lb (116.1 kg)   SpO2 97%   BMI 43.94 kg/m   BP/Weight 09/27/2017 01/04/2017 05/27/1476  Systolic BP 295 621 308  Diastolic BP 85 79 84  Wt. (Lbs) 256 252 252.8  BMI 43.94 43.26 43.39      Physical Exam  Constitutional: She is oriented to person, place, and time. She appears well-developed and  well-nourished.  Cardiovascular: Normal rate, normal heart sounds and intact distal pulses.  No murmur heard. Pulmonary/Chest: Effort normal and breath sounds normal. She has no wheezes. She has no rales. She exhibits no tenderness.  Abdominal: Soft. Bowel sounds are normal. She exhibits no distension and no mass. There is no tenderness.  Musculoskeletal: Normal range of motion. She exhibits no edema.  Tenderness to palpation of left posterior knee and left lateral joint line Right knee is normal  Neurological: She is alert and oriented to person, place, and time.  Skin: Skin is warm and dry.  Psychiatric: She has a normal mood and affect.     CMP Latest Ref Rng & Units 01/19/2017 01/04/2017 08/09/2016  Glucose 65 - 99 mg/dL 90 90 88  BUN 8 - 27 mg/dL '16 15 12  ' Creatinine 0.57 - 1.00 mg/dL 0.64 0.73 0.71  Sodium 134 - 144 mmol/L 144 144 141  Potassium 3.5 - 5.2 mmol/L 3.4(L) 3.3(L) 3.8  Chloride 96 - 106 mmol/L 105 104 102  CO2 20 - 29 mmol/L '22 29 22  ' Calcium 8.7 - 10.3 mg/dL 9.0 9.7 9.0  Total Protein 6.0 - 8.5 g/dL - 7.8 7.2  Total Bilirubin 0.0 - 1.2 mg/dL - 0.5 0.6  Alkaline Phos 39 - 117 IU/L - 76 77  AST 0 - 40 IU/L - 16 20  ALT 0 - 32 IU/L - 13 15    Lipid Panel     Component Value Date/Time   CHOL 201 (H) 08/09/2016 0834   TRIG 80 08/09/2016 0834   HDL 67 08/09/2016 0834   CHOLHDL 3.0 08/09/2016 0834   CHOLHDL 3.4 05/20/2015 0917   VLDL 13 05/20/2015 0917   LDLCALC 118 (H) 08/09/2016 0834    Assessment & Plan:   1. Essential hypertension Controlled-she is yet to take amlodipine today Will reduce dose of amlodipine due to complaints of symptomatic hypotension Low sodium, DASH diet, lifestyle modifications - amLODipine (NORVASC) 5 MG tablet; Take 1 tablet (5 mg total) by mouth daily.  Dispense: 30 tablet; Refill: 6 - CMP14+EGFR - Lipid panel  2. Pure hypercholesterolemia Controlled Low-cholesterol diet - atorvastatin (LIPITOR) 20 MG tablet; Take 1 tablet (20  mg total) by mouth daily.  Dispense: 30 tablet; Refill: 6  3. Chronic pain of left knee Uncontrolled on current regimen Weight loss to be beneficial - DG Knee Complete 4 Views Left; Future - meloxicam (MOBIC) 7.5 MG tablet; Take 1 tablet (7.5 mg total) by mouth daily.  Dispense: 30 tablet; Refill: 2  4. Bipolar disorder, in full remission, most recent episode mixed ([place]) Currently not on medications  5. Screening for viral disease - HIV antibody - Hepatitis c antibody (reflex)  6. Screening for breast cancer Declines Pap smear - MM Digital Screening; Future   Meds ordered this encounter  Medications  . meloxicam (MOBIC) 7.5 MG tablet    Sig: Take 1 tablet (7.5 mg total) by mouth daily.    Dispense:  30 tablet  Refill:  2  . amLODipine (NORVASC) 5 MG tablet    Sig: Take 1 tablet (5 mg total) by mouth daily.    Dispense:  30 tablet    Refill:  6    Discontinue previous dose  . atorvastatin (LIPITOR) 20 MG tablet    Sig: Take 1 tablet (20 mg total) by mouth daily.    Dispense:  30 tablet    Refill:  6    Follow-up: Return in about 6 months (around 03/30/2018) for Follow-up of chronic medical conditions.   Charlott Rakes MD

## 2017-09-28 ENCOUNTER — Encounter: Payer: Self-pay | Admitting: Family Medicine

## 2017-09-28 ENCOUNTER — Telehealth: Payer: Self-pay

## 2017-09-28 LAB — CMP14+EGFR
ALBUMIN: 4.2 g/dL (ref 3.6–4.8)
ALK PHOS: 79 IU/L (ref 39–117)
ALT: 13 IU/L (ref 0–32)
AST: 19 IU/L (ref 0–40)
Albumin/Globulin Ratio: 1.3 (ref 1.2–2.2)
BILIRUBIN TOTAL: 0.7 mg/dL (ref 0.0–1.2)
BUN / CREAT RATIO: 20 (ref 12–28)
BUN: 13 mg/dL (ref 8–27)
CHLORIDE: 105 mmol/L (ref 96–106)
CO2: 23 mmol/L (ref 20–29)
Calcium: 9.1 mg/dL (ref 8.7–10.3)
Creatinine, Ser: 0.65 mg/dL (ref 0.57–1.00)
GFR calc Af Amer: 109 mL/min/{1.73_m2} (ref >59)
GFR calc non Af Amer: 94 mL/min/{1.73_m2} (ref >59)
GLUCOSE: 88 mg/dL (ref 65–99)
Globulin, Total: 3.2 g/dL (ref 1.5–4.5)
POTASSIUM: 3.6 mmol/L (ref 3.5–5.2)
SODIUM: 142 mmol/L (ref 134–144)
Total Protein: 7.4 g/dL (ref 6.0–8.5)

## 2017-09-28 LAB — LIPID PANEL
CHOLESTEROL TOTAL: 198 mg/dL (ref 100–199)
Chol/HDL Ratio: 3.3 ratio (ref 0.0–4.4)
HDL: 60 mg/dL (ref >39)
LDL Calculated: 121 mg/dL — ABNORMAL HIGH (ref 0–99)
TRIGLYCERIDES: 87 mg/dL (ref 0–149)
VLDL Cholesterol Cal: 17 mg/dL (ref 5–40)

## 2017-09-28 LAB — HIV ANTIBODY (ROUTINE TESTING W REFLEX): HIV SCREEN 4TH GENERATION: NONREACTIVE

## 2017-09-28 LAB — HCV COMMENT:

## 2017-09-28 LAB — HEPATITIS C ANTIBODY (REFLEX): HCV Ab: <0.1 s/co ratio (ref 0.0–0.9)

## 2017-09-28 NOTE — Telephone Encounter (Signed)
-----   Message from Hoy Register, MD sent at 09/28/2017  8:13 AM EDT ----- Cholesterol, kidney function, liver function are all stable

## 2017-09-28 NOTE — Telephone Encounter (Signed)
Patient verified DOB Patient is aware of cholesterol, kidney and liver function being stable. No further questions.

## 2017-09-28 NOTE — Telephone Encounter (Signed)
Patient was called and informed to contact office for lab results.    If patient returns phone call please inform patient of lab results below. 

## 2017-10-05 ENCOUNTER — Telehealth: Payer: Self-pay

## 2017-10-05 NOTE — Telephone Encounter (Signed)
Patient was called and informed of lab results. 

## 2017-10-12 ENCOUNTER — Ambulatory Visit (INDEPENDENT_AMBULATORY_CARE_PROVIDER_SITE_OTHER): Payer: BLUE CROSS/BLUE SHIELD | Admitting: Orthopedic Surgery

## 2017-10-12 ENCOUNTER — Encounter (INDEPENDENT_AMBULATORY_CARE_PROVIDER_SITE_OTHER): Payer: Self-pay | Admitting: Orthopedic Surgery

## 2017-10-12 ENCOUNTER — Ambulatory Visit (INDEPENDENT_AMBULATORY_CARE_PROVIDER_SITE_OTHER): Payer: BLUE CROSS/BLUE SHIELD

## 2017-10-12 VITALS — Ht 64.0 in | Wt 256.0 lb

## 2017-10-12 DIAGNOSIS — M1712 Unilateral primary osteoarthritis, left knee: Secondary | ICD-10-CM

## 2017-10-12 DIAGNOSIS — G8929 Other chronic pain: Secondary | ICD-10-CM | POA: Diagnosis not present

## 2017-10-12 DIAGNOSIS — M25562 Pain in left knee: Secondary | ICD-10-CM

## 2017-10-12 MED ORDER — BUPIVACAINE HCL 0.25 % IJ SOLN
4.0000 mL | INTRAMUSCULAR | Status: AC | PRN
  Administered 2017-10-12: 4 mL via INTRA_ARTICULAR

## 2017-10-12 MED ORDER — METHYLPREDNISOLONE ACETATE 40 MG/ML IJ SUSP
40.0000 mg | INTRAMUSCULAR | Status: AC | PRN
  Administered 2017-10-12: 40 mg via INTRA_ARTICULAR

## 2017-10-12 MED ORDER — LIDOCAINE HCL 1 % IJ SOLN
5.0000 mL | INTRAMUSCULAR | Status: AC | PRN
  Administered 2017-10-12: 5 mL

## 2017-10-12 NOTE — Progress Notes (Signed)
Office Visit Note   Patient: Stephanie Juarez           Date of Birth: [birth year]           MRN: [id number] Visit Date: 10/12/2017 Requested by: Hoy Register, MD [address] [place], [place] [address] PCP: Hoy Register, MD  Subjective: Chief Complaint  Patient presents with  . Left Knee - Pain    HPI: Stephanie Juarez is a patient with left knee pain.  She reports an aching behind the knee.  Wakes her from sleep at night at times.'s been worse over the last 2 to 3 months.  Reports constant pain.  Describes some weakness and giving way.  She takes Aleve for the problem.  She does describe limited walking endurance.  She is looking for a job.  She has a history of a meniscal tear on that side.  Denies any radiating symptoms down below the knee.              ROS: All systems reviewed are negative as they relate to the chief complaint within the history of present illness.  Patient denies  fevers or chills.   Assessment & Plan: Visit Diagnoses:  1. Chronic pain of left knee   2. Unilateral primary osteoarthritis, left knee     Plan: Impression is left knee arthritis in a patient with BMI 44.  Injection performed today with cortisone.  Non-loadbearing quad strengthening exercises encouraged.  Stephanie Juarez will likely need knee replacement at some time in the future but she does need to get below the 40 BMI level to really have diminished risk for complications.  I will see her back as needed.  We could consider gel injection as a next step.  Follow-Up Instructions: Return if symptoms worsen or fail to improve.   Orders:  Orders Placed This Encounter  Procedures  . XR KNEE 3 VIEW LEFT   No orders of the defined types were placed in this encounter.     Procedures: Large Joint Inj: L knee on 10/12/2017 11:43 AM Indications: diagnostic evaluation, joint swelling and pain Details: 18 G 1.5 in needle, superolateral approach  Arthrogram: No  Medications: 5 mL lidocaine 1 %; 40 mg  methylPREDNISolone acetate 40 MG/ML; 4 mL bupivacaine 0.25 % Outcome: tolerated well, no immediate complications Procedure, treatment alternatives, risks and benefits explained, specific risks discussed. Consent was given by the patient. Immediately prior to procedure a time out was called to verify the correct patient, procedure, equipment, support staff and site/side marked as required. Patient was prepped and draped in the usual sterile fashion.       Clinical Data: No additional findings.  Objective: Vital Signs: Ht 5\' 4"  (1.626 m)   Wt 256 lb (116.1 kg)   BMI 43.94 kg/m   Physical Exam:   Constitutional: Patient appears well-developed HEENT:  Head: Normocephalic Eyes:EOM are normal Neck: Normal range of motion Cardiovascular: Normal rate Pulmonary/chest: Effort normal Neurologic: Patient is alert Skin: Skin is warm Psychiatric: Patient has normal mood and affect    Ortho Exam: Orthopedic exam demonstrates full active and passive range of motion of the right knee.  Left knee has full extension and flexion to about 120.  Collateral and cruciate ligaments are stable.  Pedal pulses palpable.  No groin pain with internal/external rotation of the leg.  No masses lymphadenopathy or skin changes noted in that knee region.  Extensor mechanism is intact and nontender.  Specialty Comments:  No specialty comments available.  Imaging:  Xr Knee 3 View Left  Result Date: 10/12/2017 AP lateral merchant left knee reviewed.  End-stage tricompartmental arthritis is present worse in the medial and lateral compartments.  Overall alignment neutral.  No fracture or dislocation present.  No effusion in the knee.    PMFS History: Patient Active Problem List   Diagnosis Date Noted  . Bipolar disorder in full remission (HCC) 01/04/2017  . Hypokalemia 03/16/2016  . Cholelithiasis 03/15/2016  . Hyperlipidemia 03/15/2016  . Knee pain 11/18/2015  . Complete tear of right rotator cuff  08/05/2015  . 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 Essential hypertension 06/26/2014  . Memory loss 06/26/2014   Past Medical History:  Diagnosis Date  . Arthritis   . Hypertension     Family History  Problem Relation Age of Onset  . Arthritis Mother   . Heart disease Mother   . Heart disease Father     Past Surgical History:  Procedure Laterality Date  . MANDIBLE SURGERY Left 1981   impacted teeth  . SHOULDER ARTHROSCOPY Left 2001   for cyst   Social History   Occupational History  . Not on file  Tobacco Use  . Smoking status: Former Games developermoker  . Smokeless tobacco: Never Used  Substance and Sexual Activity  . Alcohol use: No  . Drug use: No  . Sexual activity: Not on file

## 2017-11-08 ENCOUNTER — Other Ambulatory Visit: Payer: Self-pay | Admitting: Family Medicine

## 2017-11-08 DIAGNOSIS — Z1231 Encounter for screening mammogram for malignant neoplasm of breast: Secondary | ICD-10-CM

## 2017-12-05 ENCOUNTER — Telehealth: Payer: Self-pay

## 2017-12-05 NOTE — Telephone Encounter (Signed)
Patient called and requested to speak with pcp regarding a new medication she has been taking. Patient stated is was nothing the pcp prescribed. Please fu at your earliest convenience.

## 2017-12-08 NOTE — Telephone Encounter (Signed)
Patient was called and a voicemail was left informing patient to return phone call. 

## 2017-12-08 NOTE — Telephone Encounter (Signed)
Patient returned phone call and patient states that she would like to try Naproxen for her pain. Patient use pharmacy onsite.

## 2017-12-11 ENCOUNTER — Other Ambulatory Visit: Payer: Self-pay | Admitting: Family Medicine

## 2017-12-11 MED ORDER — NAPROXEN 500 MG PO TABS: 500.0000 mg | ORAL_TABLET | Freq: Two times a day (BID) | ORAL | 1 refills | Status: DC

## 2017-12-11 MED FILL — NAPROXEN 500 MG TABLET: 500 | 30 days supply | Qty: 60 | Fill #0

## 2017-12-11 MED FILL — AMLODIPINE BESYLATE 5 MG TA: 5 | 30 days supply | Qty: 30 | Fill #1

## 2017-12-11 NOTE — Telephone Encounter (Signed)
Patient was called and informed of medication being sent to onsite pharmacy. 

## 2017-12-11 NOTE — Telephone Encounter (Signed)
I have discontinued meloxicam and sent a prescription for naproxen to her pharmacy as she should not be taking meloxicam and naproxen at the same time since both are in the same class of medications.

## 2018-01-19 ENCOUNTER — Other Ambulatory Visit: Payer: Self-pay | Admitting: Family Medicine

## 2018-01-19 DIAGNOSIS — I10 Essential (primary) hypertension: Secondary | ICD-10-CM

## 2018-01-19 MED FILL — AMLODIPINE BESYLATE 5 MG TA: 5 | 30 days supply | Qty: 30 | Fill #2

## 2018-01-19 MED FILL — LISINOPRIL 10 MG TABS: 10 | 30 days supply | Qty: 30 | Fill #0

## 2018-01-29 MED FILL — NAPROXEN 500 MG TABLET: 500 | 30 days supply | Qty: 60 | Fill #1

## 2018-03-22 ENCOUNTER — Telehealth: Payer: Self-pay | Admitting: Family Medicine

## 2018-03-22 DIAGNOSIS — I10 Essential (primary) hypertension: Secondary | ICD-10-CM

## 2018-03-22 NOTE — Telephone Encounter (Signed)
1) Medication(s) Requested (by name): -amLODipine (NORVASC) 5 MG tablet  -lisinopril (PRINIVIL,ZESTRIL) 10 MG tablet   2) Pharmacy of Choice: -[facility] [place]) Special Requests:   Approved medications will be sent to the pharmacy, we will reach out if there is an issue.  Requests made after 3pm may not be addressed until the following business day!  If a patient is unsure of the name of the medication(s) please note and ask patient to call back when they are able to provide all info, do not send to responsible party until all information is available!

## 2018-03-23 MED ORDER — LISINOPRIL 10 MG PO TABS: 10.0000 mg | ORAL_TABLET | Freq: Every day | ORAL | 0 refills | Status: DC

## 2018-03-23 MED ORDER — AMLODIPINE BESYLATE 5 MG PO TABS: 5.0000 mg | ORAL_TABLET | Freq: Every day | ORAL | 0 refills | Status: DC

## 2018-03-23 MED FILL — LISINOPRIL 10 MG TABS: 10 | 30 days supply | Qty: 30 | Fill #0

## 2018-03-23 MED FILL — AMLODIPINE BESYLATE 5 MG TA: 5 | 30 days supply | Qty: 30 | Fill #0

## 2018-03-29 ENCOUNTER — Ambulatory Visit: Payer: Medicare HMO | Attending: Family Medicine | Admitting: Family Medicine

## 2018-03-29 ENCOUNTER — Encounter: Payer: Self-pay | Admitting: Family Medicine

## 2018-03-29 VITALS — BP 146/85 | HR 68 | Temp 98.0°F | Ht 64.0 in | Wt 228.6 lb

## 2018-03-29 DIAGNOSIS — Z79899 Other long term (current) drug therapy: Secondary | ICD-10-CM | POA: Insufficient documentation

## 2018-03-29 DIAGNOSIS — Z6839 Body mass index (BMI) 39.0-39.9, adult: Secondary | ICD-10-CM | POA: Diagnosis not present

## 2018-03-29 DIAGNOSIS — E6609 Other obesity due to excess calories: Secondary | ICD-10-CM | POA: Diagnosis not present

## 2018-03-29 DIAGNOSIS — E7800 Pure hypercholesterolemia, unspecified: Secondary | ICD-10-CM | POA: Diagnosis not present

## 2018-03-29 DIAGNOSIS — F3178 Bipolar disorder, in full remission, most recent episode mixed: Secondary | ICD-10-CM | POA: Diagnosis not present

## 2018-03-29 DIAGNOSIS — Z888 Allergy status to other drugs, medicaments and biological substances status: Secondary | ICD-10-CM | POA: Insufficient documentation

## 2018-03-29 DIAGNOSIS — I10 Essential (primary) hypertension: Secondary | ICD-10-CM | POA: Diagnosis not present

## 2018-03-29 DIAGNOSIS — M25562 Pain in left knee: Secondary | ICD-10-CM | POA: Insufficient documentation

## 2018-03-29 DIAGNOSIS — E669 Obesity, unspecified: Secondary | ICD-10-CM | POA: Insufficient documentation

## 2018-03-29 MED ORDER — AMLODIPINE BESYLATE 10 MG PO TABS: 10.0000 mg | ORAL_TABLET | Freq: Every day | ORAL | 6 refills | Status: DC

## 2018-03-29 MED ORDER — ATORVASTATIN CALCIUM 20 MG PO TABS: 20.0000 mg | ORAL_TABLET | Freq: Every day | ORAL | 6 refills | Status: DC

## 2018-03-29 MED ORDER — LISINOPRIL 10 MG PO TABS: 10.0000 mg | ORAL_TABLET | Freq: Every day | ORAL | 6 refills | Status: DC

## 2018-03-29 MED FILL — ATORVASTATIN 20 MG TABLET: 20 | 30 days supply | Qty: 30 | Fill #0

## 2018-03-29 NOTE — Progress Notes (Signed)
Subjective:  Patient ID: Stephanie HawSallie T Neuzil, female    DOB: 01/05/1951  Age: 67 y.o. MRN: [id number]  CC: Hypertension   HPI Stephanie Juarez is a 67 year old female with a history of hypertension, hyperlipidemia, obesity, chronic left knee pain, bipolar disorder who presents today for a follow-up visit. Since her last office visit 6 months ago she has lost 28 pounds by means of cutting out sweets and carbs; she does not exercise but plans to commence an exercise regimen. For bipolar disorder she is currently not on medications and has been asymptomatic She is compliant with her antihypertensive and her statin but has had to take 2 of her 5 mg amlodipine tablets on 2 of her10 mg lisinopril tablets due to elevated blood pressures which has sometimes gone up to 180 systolic She denies chest pains, shortness of breath, pedal edema and has no myalgias from her statin. 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  Past Medical History:  Diagnosis Date  . Arthritis   . Hypertension     Past Surgical History:  Procedure Laterality Date  . MANDIBLE SURGERY Left 1981   impacted teeth  . SHOULDER ARTHROSCOPY Left 2001   for cyst    Allergies  Allergen Reactions  . Tramadol Itching     Outpatient Medications Prior to Visit  Medication Sig Dispense Refill  . amLODipine (NORVASC) 5 MG tablet Take 1 tablet (5 mg total) by mouth daily. 30 tablet 0  . atorvastatin (LIPITOR) 20 MG tablet Take 1 tablet (20 mg total) by mouth daily. 30 tablet 6  . lisinopril (PRINIVIL,ZESTRIL) 10 MG tablet Take 1 tablet (10 mg total) by mouth daily. 30 tablet 0  . HYDROcodone-acetaminophen (NORCO/VICODIN) 5-325 MG tablet Take 2 tablets by mouth every 4 (four) hours as needed. (Patient not taking: Reported on 08/04/2016) 20 tablet 0  . naproxen (NAPROSYN) 500 MG tablet Take 1 tablet (500 mg total) by mouth 2 (two) times daily with a  meal. (Patient not taking: Reported on 03/29/2018) 60 tablet 1  . ondansetron (ZOFRAN ODT) 4 MG disintegrating tablet Take 1 tablet (4 mg total) by mouth every 8 (eight) hours as needed for nausea or vomiting. (Patient not taking: Reported on 01/04/2017) 20 tablet 0  . tetrahydrozoline (VISINE) 0.05 % ophthalmic solution Place 1 drop into both eyes daily as needed (dry eyes).    Marland Kitchen. lurasidone (LATUDA) 40 MG TABS tablet Take 40 mg by mouth daily with breakfast.     No facility-administered medications prior to visit.     ROS Review of Systems  Constitutional: Negative for activity change, appetite change and fatigue.  HENT: Positive for hearing loss (R). Negative for congestion, sinus pressure and sore throat.   Eyes: Negative for visual disturbance.  Respiratory: Negative for cough, chest tightness, shortness of breath and wheezing.   Cardiovascular: Negative for chest pain and palpitations.  Gastrointestinal: Negative for abdominal distention, abdominal pain and constipation.  Endocrine: Negative for polydipsia.  Genitourinary: Negative for dysuria and frequency.  Musculoskeletal: Negative for arthralgias and back pain.  Skin: Negative for rash.  Neurological: Negative for tremors, light-headedness and numbness.  Hematological: Does not bruise/bleed easily.  Psychiatric/Behavioral: Negative for agitation and behavioral problems.    Objective:  BP (!) 146/85   Pulse 68   Temp 98 F (36.7 C) (Oral)   Ht 5\' 4"  (1.626 m)   Wt 228 lb 9.6 oz (  103.7 kg)   SpO2 97%   BMI 39.24 kg/m   BP/Weight 03/29/2018 10/12/2017 09/27/2017  Systolic BP 146 - 170  Diastolic BP 85 - 85  Wt. (Lbs) 228.6 256 256  BMI 39.24 43.94 43.94      Physical Exam  Constitutional: She is oriented to person, place, and time. She appears well-developed and well-nourished.  HENT:  Morbidly obese  Cardiovascular: Normal rate, normal heart sounds and intact distal pulses.  No murmur heard. Pulmonary/Chest:  Effort normal and breath sounds normal. She has no wheezes. She has no rales. She exhibits no tenderness.  Abdominal: Soft. Bowel sounds are normal. She exhibits no distension and no mass. There is no tenderness.  Musculoskeletal: Normal range of motion.  Neurological: She is alert and oriented to person, place, and time.  Psychiatric: She has a normal mood and affect.     CMP Latest Ref Rng & Units 09/27/2017 01/19/2017 01/04/2017  Glucose 65 - 99 mg/dL 88 90 90  BUN 8 - 27 mg/dL 13 16 15   Creatinine 0.57 - 1.00 mg/dL 1.61 0.96 0.45  Sodium 134 - 144 mmol/L 142 144 144  Potassium 3.5 - 5.2 mmol/L 3.6 3.4(L) 3.3(L)  Chloride 96 - 106 mmol/L 105 105 104  CO2 20 - 29 mmol/L 23 22 29   Calcium 8.7 - 10.3 mg/dL 9.1 9.0 9.7  Total Protein 6.0 - 8.5 g/dL 7.4 - 7.8  Total Bilirubin 0.0 - 1.2 mg/dL 0.7 - 0.5  Alkaline Phos 39 - 117 IU/L 79 - 76  AST 0 - 40 IU/L 19 - 16  ALT 0 - 32 IU/L 13 - 13    Lipid Panel     Component Value Date/Time   CHOL 198 09/27/2017 1004   TRIG 87 09/27/2017 1004   HDL 60 09/27/2017 1004   CHOLHDL 3.3 09/27/2017 1004   CHOLHDL 3.4 05/20/2015 0917   VLDL 13 05/20/2015 0917   LDLCALC 121 (H) 09/27/2017 1004    The 10-year ASCVD risk score Denman George DC Jr., et al., 2013) is: 12%   Values used to calculate the score:     Age: 24 years     Sex: Female     Is Non-Hispanic African American: Yes     Diabetic: No     Tobacco smoker: No     Systolic Blood Pressure: 146 mmHg     Is BP treated: Yes     HDL Cholesterol: 60 mg/dL     Total Cholesterol: 198 mg/dL  Assessment & Plan:   1. Essential hypertension Uncontrolled Increased dose of amlodipine and lisinopril given elevated blood pressures at home as well Counseled on blood pressure goal of less than 130/80, low-sodium, DASH diet, medication compliance, 150 minutes of moderate intensity exercise per week. Discussed medication compliance, adverse effects. - Basic Metabolic Panel - amLODipine (NORVASC) 10 MG  tablet; Take 1 tablet (10 mg total) by mouth daily.  Dispense: 30 tablet; Refill: 6 - lisinopril (PRINIVIL,ZESTRIL) 10 MG tablet; Take 1 tablet (10 mg total) by mouth daily.  Dispense: 30 tablet; Refill: 6  2. Pure hypercholesterolemia Uncontrolled from last labs Low-cholesterol diet We will repeat at next office visit - atorvastatin (LIPITOR) 20 MG tablet; Take 1 tablet (20 mg total) by mouth daily.  Dispense: 30 tablet; Refill: 6  3. Bipolar disorder, in full remission, most recent episode mixed (HCC) Currently not on medications  4. Class 2 obesity due to excess calories without serious comorbidity with body mass index (BMI) of 39.0 to 39.9  in adult She has lost 28 pounds in the last 6 months and has been commended Continuing lifestyle modifications, reducing portion sizes   Meds ordered this encounter  Medications  . amLODipine (NORVASC) 10 MG tablet    Sig: Take 1 tablet (10 mg total) by mouth daily.    Dispense:  30 tablet    Refill:  6    Discontinue previous dose  . lisinopril (PRINIVIL,ZESTRIL) 10 MG tablet    Sig: Take 1 tablet (10 mg total) by mouth daily.    Dispense:  30 tablet    Refill:  6  . atorvastatin (LIPITOR) 20 MG tablet    Sig: Take 1 tablet (20 mg total) by mouth daily.    Dispense:  30 tablet    Refill:  6    Follow-up: Return in about 1 month (around 04/28/2018) for Complete physical exam.   Hoy Register MD

## 2018-03-29 NOTE — Progress Notes (Signed)
Patient needs to discuss BP medication.

## 2018-03-30 LAB — BASIC METABOLIC PANEL
BUN / CREAT RATIO: 17 (ref 12–28)
BUN: 13 mg/dL (ref 8–27)
CO2: 22 mmol/L (ref 20–29)
Calcium: 9 mg/dL (ref 8.7–10.3)
Chloride: 102 mmol/L (ref 96–106)
Creatinine, Ser: 0.75 mg/dL (ref 0.57–1.00)
GFR calc non Af Amer: 84 mL/min/{1.73_m2} (ref >59)
GFR, EST AFRICAN AMERICAN: 97 mL/min/{1.73_m2} (ref >59)
Glucose: 87 mg/dL (ref 65–99)
POTASSIUM: 3.6 mmol/L (ref 3.5–5.2)
SODIUM: 140 mmol/L (ref 134–144)

## 2018-04-02 ENCOUNTER — Telehealth: Payer: Self-pay

## 2018-04-02 ENCOUNTER — Telehealth: Payer: Self-pay | Admitting: Family Medicine

## 2018-04-02 ENCOUNTER — Other Ambulatory Visit: Payer: Self-pay

## 2018-04-02 MED ORDER — NAPROXEN 500 MG PO TABS: 500.0000 mg | ORAL_TABLET | Freq: Two times a day (BID) | ORAL | 1 refills | Status: DC

## 2018-04-02 NOTE — Telephone Encounter (Signed)
Pt came in to request a medication refill on -naproxen (NAPROSYN) 500 MG tablet  Please follow up to [facility]

## 2018-04-02 NOTE — Telephone Encounter (Signed)
Medication has been refilled and sent to onsite pharmacy.

## 2018-04-02 NOTE — Telephone Encounter (Signed)
Patient was called and informed of lab results. 

## 2018-04-02 NOTE — Telephone Encounter (Signed)
-----   Message from Hoy RegisterEnobong Newlin, MD sent at 03/30/2018  8:51 AM EST ----- Labs are normal

## 2018-04-03 MED FILL — AMLODIPINE BESYLATE 10 MG T: 10 | 30 days supply | Qty: 30 | Fill #0

## 2018-04-03 MED FILL — NAPROXEN 500 MG TABLET: 500 | 30 days supply | Qty: 60 | Fill #0

## 2018-04-26 ENCOUNTER — Encounter: Payer: Self-pay | Admitting: Family Medicine

## 2018-04-26 ENCOUNTER — Ambulatory Visit: Payer: Medicare HMO | Attending: Family Medicine | Admitting: Family Medicine

## 2018-04-26 VITALS — BP 132/76 | HR 81 | Temp 97.4°F | Ht 64.0 in | Wt 240.0 lb

## 2018-04-26 DIAGNOSIS — Z6841 Body Mass Index (BMI) 40.0 and over, adult: Secondary | ICD-10-CM | POA: Insufficient documentation

## 2018-04-26 DIAGNOSIS — G8929 Other chronic pain: Secondary | ICD-10-CM | POA: Insufficient documentation

## 2018-04-26 DIAGNOSIS — E669 Obesity, unspecified: Secondary | ICD-10-CM | POA: Insufficient documentation

## 2018-04-26 DIAGNOSIS — I10 Essential (primary) hypertension: Secondary | ICD-10-CM | POA: Diagnosis not present

## 2018-04-26 DIAGNOSIS — T464X5A Adverse effect of angiotensin-converting-enzyme inhibitors, initial encounter: Secondary | ICD-10-CM | POA: Insufficient documentation

## 2018-04-26 DIAGNOSIS — Z885 Allergy status to narcotic agent status: Secondary | ICD-10-CM | POA: Insufficient documentation

## 2018-04-26 DIAGNOSIS — M1990 Unspecified osteoarthritis, unspecified site: Secondary | ICD-10-CM | POA: Insufficient documentation

## 2018-04-26 DIAGNOSIS — Z79899 Other long term (current) drug therapy: Secondary | ICD-10-CM | POA: Insufficient documentation

## 2018-04-26 DIAGNOSIS — E785 Hyperlipidemia, unspecified: Secondary | ICD-10-CM | POA: Insufficient documentation

## 2018-04-26 DIAGNOSIS — M25562 Pain in left knee: Secondary | ICD-10-CM | POA: Insufficient documentation

## 2018-04-26 DIAGNOSIS — F319 Bipolar disorder, unspecified: Secondary | ICD-10-CM | POA: Insufficient documentation

## 2018-04-26 DIAGNOSIS — Z888 Allergy status to other drugs, medicaments and biological substances status: Secondary | ICD-10-CM | POA: Insufficient documentation

## 2018-04-26 DIAGNOSIS — R05 Cough: Secondary | ICD-10-CM | POA: Insufficient documentation

## 2018-04-26 MED ORDER — CARVEDILOL 3.125 MG PO TABS: 3.1250 mg | ORAL_TABLET | Freq: Two times a day (BID) | ORAL | 6 refills | Status: DC

## 2018-04-26 MED FILL — CARVEDILOL 3.125 MG TABLET: 3.125 | 30 days supply | Qty: 60 | Fill #0

## 2018-04-26 NOTE — Progress Notes (Signed)
Subjective:  Patient ID: Stephanie Juarez, female    DOB: 08/31/1950  Age: 67 y.o. MRN: [id number]  CC: Medication Reaction   HPI Stephanie Juarez is a 67 year old female with a history of hypertension, hyperlipidemia, obesity, chronic left knee pain, bipolar disorder who presents today with complaints of cough on taking lisinopril. Stephanie Juarez has been out of lisinopril for the last couple of days and has noticed absence of cough Stephanie Juarez has been taking 2 of her 10 mg amlodipine tablets due to running out of lisinopril. Denies additional concerns today.  Past Medical History:  Diagnosis Date  . Arthritis   . Hypertension     Past Surgical History:  Procedure Laterality Date  . MANDIBLE SURGERY Left 1981   impacted teeth  . SHOULDER ARTHROSCOPY Left 2001   for cyst    Allergies  Allergen Reactions  . Tramadol Itching     Outpatient Medications Prior to Visit  Medication Sig Dispense Refill  . amLODipine (NORVASC) 10 MG tablet Take 1 tablet (10 mg total) by mouth daily. 30 tablet 6  . atorvastatin (LIPITOR) 20 MG tablet Take 1 tablet (20 mg total) by mouth daily. (Patient not taking: Reported on 04/26/2018) 30 tablet 6  . HYDROcodone-acetaminophen (NORCO/VICODIN) 5-325 MG tablet Take 2 tablets by mouth every 4 (four) hours as needed. (Patient not taking: Reported on 08/04/2016) 20 tablet 0  . naproxen (NAPROSYN) 500 MG tablet Take 1 tablet (500 mg total) by mouth 2 (two) times daily with a meal. (Patient not taking: Reported on 04/26/2018) 60 tablet 1  . ondansetron (ZOFRAN ODT) 4 MG disintegrating tablet Take 1 tablet (4 mg total) by mouth every 8 (eight) hours as needed for nausea or vomiting. (Patient not taking: Reported on 01/04/2017) 20 tablet 0  . tetrahydrozoline (VISINE) 0.05 % ophthalmic solution Place 1 drop into both eyes daily as needed (dry eyes).    Marland Kitchen. lisinopril (PRINIVIL,ZESTRIL) 10 MG tablet Take 1 tablet (10 mg total) by mouth daily. (Patient not taking: Reported on  04/26/2018) 30 tablet 6   No facility-administered medications prior to visit.     ROS Review of Systems  Constitutional: Negative for activity change, appetite change and fatigue.  HENT: Negative for congestion, sinus pressure and sore throat.   Eyes: Negative for visual disturbance.  Respiratory: Positive for cough. Negative for chest tightness, shortness of breath and wheezing.   Cardiovascular: Negative for chest pain and palpitations.  Gastrointestinal: Negative for abdominal distention, abdominal pain and constipation.  Endocrine: Negative for polydipsia.  Genitourinary: Negative for dysuria and frequency.  Musculoskeletal: Negative for arthralgias and back pain.  Skin: Negative for rash.  Neurological: Negative for tremors, light-headedness and numbness.  Hematological: Does not bruise/bleed easily.  Psychiatric/Behavioral: Negative for agitation and behavioral problems.    Objective:  BP 132/76   Pulse 81   Temp (!) 97.4 F (36.3 C) (Oral)   Ht 5\' 4"  (1.626 m)   Wt 240 lb (108.9 kg)   SpO2 100%   BMI 41.20 kg/m   BP/Weight 04/26/2018 03/29/2018 10/12/2017  Systolic BP 132 146 -  Diastolic BP 76 85 -  Wt. (Lbs) 240 228.6 256  BMI 41.2 39.24 43.94      Physical Exam Constitutional:      Appearance: Stephanie Juarez is well-developed.  Cardiovascular:     Rate and Rhythm: Normal rate.     Heart sounds: Normal heart sounds. No murmur.  Pulmonary:     Effort: Pulmonary effort is normal.  Breath sounds: Normal breath sounds. No wheezing or rales.  Chest:     Chest wall: No tenderness.  Abdominal:     General: Bowel sounds are normal. There is no distension.     Palpations: Abdomen is soft. There is no mass.     Tenderness: There is no abdominal tenderness.  Musculoskeletal: Normal range of motion.  Neurological:     Mental Status: Stephanie Juarez is alert and oriented to person, place, and time.      Assessment & Plan:   1. Essential hypertension Controlled Discontinue  lisinopril due to cough and commence carvedilol Continue amlodipine Low-sodium, DASH diet - carvedilol (COREG) 3.125 MG tablet; Take 1 tablet (3.125 mg total) by mouth 2 (two) times daily with a meal.  Dispense: 60 tablet; Refill: 6   Meds ordered this encounter  Medications  . carvedilol (COREG) 3.125 MG tablet    Sig: Take 1 tablet (3.125 mg total) by mouth 2 (two) times daily with a meal.    Dispense:  60 tablet    Refill:  6    Discontinue lisinopril    Follow-up: Return for Follow-up of chronic medical conditions, keep previously scheduled appointment.   Hoy RegisterEnobong Nikira Kushnir MD

## 2018-04-26 NOTE — Progress Notes (Signed)
Patient states that after taking BP meds patient get a real bad cough.

## 2018-04-30 MED FILL — AMLODIPINE BESYLATE 10 MG T: 10 | 30 days supply | Qty: 30 | Fill #1

## 2018-05-07 ENCOUNTER — Ambulatory Visit: Payer: BLUE CROSS/BLUE SHIELD | Admitting: Family Medicine

## 2018-06-06 ENCOUNTER — Ambulatory Visit: Payer: BLUE CROSS/BLUE SHIELD | Admitting: Family Medicine

## 2018-06-29 MED FILL — CARVEDILOL 3.125 MG TABLET: 3.125 | 30 days supply | Qty: 60 | Fill #1

## 2018-06-29 MED FILL — NAPROXEN 500 MG TABLET: 500 | 30 days supply | Qty: 60 | Fill #1

## 2018-06-29 MED FILL — AMLODIPINE BESYLATE 10 MG T: 10 | 30 days supply | Qty: 30 | Fill #2

## 2018-08-29 ENCOUNTER — Telehealth: Payer: Self-pay | Admitting: Family Medicine

## 2018-08-29 NOTE — Telephone Encounter (Signed)
1) Medication(s) Requested (by name): naproxen (NAPROSYN) 500 MG tablet   2) Pharmacy of Choice: [facility] 3) Special Requests:   Approved medications will be sent to the pharmacy, we will reach out if there is an issue.  Requests made after 3pm may not be addressed until the following business day!  If a patient is unsure of the name of the medication(s) please note and ask patient to call back when they are able to provide all info, do not send to responsible party until all information is available!

## 2018-08-30 MED ORDER — NAPROXEN 500 MG PO TABS: 500.0000 mg | ORAL_TABLET | Freq: Two times a day (BID) | ORAL | 1 refills | Status: DC

## 2018-08-30 MED FILL — NAPROXEN 500 MG TABLET: 500 | 30 days supply | Qty: 60 | Fill #0

## 2018-08-31 MED FILL — AMLODIPINE BESYLATE 10 MG T: 10 | 30 days supply | Qty: 30 | Fill #3

## 2018-10-29 MED FILL — NAPROXEN 500 MG TABLET: 500 | 30 days supply | Qty: 60 | Fill #1

## 2018-10-29 MED FILL — CARVEDILOL 3.125 MG TABLET: 3.125 | 30 days supply | Qty: 60 | Fill #2

## 2018-10-29 MED FILL — AMLODIPINE BESYLATE 10 MG T: 10 | 30 days supply | Qty: 30 | Fill #4

## 2018-11-10 DIAGNOSIS — H524 Presbyopia: Secondary | ICD-10-CM | POA: Diagnosis not present

## 2018-12-25 ENCOUNTER — Other Ambulatory Visit: Payer: Self-pay | Admitting: Family Medicine

## 2018-12-25 MED FILL — AMLODIPINE BESYLATE 10 MG T: 10 | 30 days supply | Qty: 30 | Fill #5

## 2018-12-25 MED FILL — CARVEDILOL 3.125 MG TABLET: 3.125 | 30 days supply | Qty: 60 | Fill #3

## 2018-12-27 ENCOUNTER — Telehealth: Payer: Self-pay | Admitting: Family Medicine

## 2018-12-27 NOTE — Telephone Encounter (Signed)
1) Medication(s) Requested (by name): naproxen (NAPROSYN) 500 MG tablet [[id number]]   2) Pharmacy of Choice: [facility] 3) Special Requests:   Approved medications will be sent to the pharmacy, we will reach out if there is an issue.  Requests made after 3pm may not be addressed until the following business day!  If a patient is unsure of the name of the medication(s) please note and ask patient to call back when they are able to provide all info, do not send to responsible party until all information is available!

## 2018-12-28 ENCOUNTER — Other Ambulatory Visit: Payer: Self-pay

## 2018-12-28 ENCOUNTER — Other Ambulatory Visit: Payer: Self-pay | Admitting: Family Medicine

## 2018-12-28 MED ORDER — NAPROXEN 500 MG PO TABS: 500.0000 mg | ORAL_TABLET | Freq: Two times a day (BID) | ORAL | 1 refills | Status: DC

## 2018-12-28 MED FILL — NAPROXEN 500 MG TABLET: 500 | 30 days supply | Qty: 60 | Fill #0

## 2018-12-28 NOTE — Telephone Encounter (Signed)
Medication was refilled and sent to pharmacy. 

## 2019-03-13 DIAGNOSIS — Z0100 Encounter for examination of eyes and vision without abnormal findings: Secondary | ICD-10-CM | POA: Diagnosis not present

## 2019-03-18 MED FILL — AMLODIPINE BESYLATE 10 MG T: 10 | 30 days supply | Qty: 30 | Fill #6

## 2019-03-18 MED FILL — NAPROXEN 500 MG TABLET: 500 | 30 days supply | Qty: 60 | Fill #1

## 2019-04-03 ENCOUNTER — Ambulatory Visit: Payer: Medicare HMO | Admitting: Family Medicine

## 2019-05-07 ENCOUNTER — Other Ambulatory Visit: Payer: Self-pay | Admitting: Family Medicine

## 2019-05-07 DIAGNOSIS — I10 Essential (primary) hypertension: Secondary | ICD-10-CM

## 2019-05-14 ENCOUNTER — Ambulatory Visit: Payer: Medicare HMO | Attending: Family Medicine | Admitting: Family Medicine

## 2019-05-14 ENCOUNTER — Encounter: Payer: Self-pay | Admitting: Family Medicine

## 2019-05-14 ENCOUNTER — Other Ambulatory Visit: Payer: Self-pay

## 2019-05-14 VITALS — BP 149/74 | HR 71 | Temp 98.4°F | Ht 64.0 in | Wt 253.0 lb

## 2019-05-14 DIAGNOSIS — E2839 Other primary ovarian failure: Secondary | ICD-10-CM

## 2019-05-14 DIAGNOSIS — I10 Essential (primary) hypertension: Secondary | ICD-10-CM

## 2019-05-14 DIAGNOSIS — E7800 Pure hypercholesterolemia, unspecified: Secondary | ICD-10-CM | POA: Diagnosis not present

## 2019-05-14 DIAGNOSIS — Z0000 Encounter for general adult medical examination without abnormal findings: Secondary | ICD-10-CM | POA: Diagnosis not present

## 2019-05-14 DIAGNOSIS — G8929 Other chronic pain: Secondary | ICD-10-CM | POA: Diagnosis not present

## 2019-05-14 DIAGNOSIS — M25562 Pain in left knee: Secondary | ICD-10-CM | POA: Diagnosis not present

## 2019-05-14 DIAGNOSIS — Z1231 Encounter for screening mammogram for malignant neoplasm of breast: Secondary | ICD-10-CM | POA: Diagnosis not present

## 2019-05-14 DIAGNOSIS — Z23 Encounter for immunization: Secondary | ICD-10-CM

## 2019-05-14 MED ORDER — NAPROXEN 500 MG PO TABS: 500.0000 mg | ORAL_TABLET | Freq: Two times a day (BID) | ORAL | 1 refills | Status: DC

## 2019-05-14 MED ORDER — AMLODIPINE BESYLATE 10 MG PO TABS: 10.0000 mg | ORAL_TABLET | Freq: Every day | ORAL | 6 refills | Status: DC

## 2019-05-14 MED ORDER — ATORVASTATIN CALCIUM 20 MG PO TABS: 20.0000 mg | ORAL_TABLET | Freq: Every day | ORAL | 6 refills | Status: DC

## 2019-05-14 MED ORDER — CARVEDILOL 3.125 MG PO TABS: 3.1250 mg | ORAL_TABLET | Freq: Two times a day (BID) | ORAL | 6 refills | Status: DC

## 2019-05-14 MED FILL — CARVEDILOL 3.125 MG TABLET: 3.125 | 30 days supply | Qty: 60 | Fill #0

## 2019-05-14 MED FILL — ATORVASTATIN CALCIUM 20 MG: 20 | 30 days supply | Qty: 30 | Fill #0

## 2019-05-14 MED FILL — AMLODIPINE BESYLATE 10 MG T: 10 | 30 days supply | Qty: 30 | Fill #0

## 2019-05-14 MED FILL — NAPROXEN 500 MG TABLET: 500 | 30 days supply | Qty: 60 | Fill #0

## 2019-05-14 NOTE — Progress Notes (Signed)
Pain in arms and legs.

## 2019-05-14 NOTE — Patient Instructions (Signed)
Once I review your test results, I will be in touch with you via 'my chart' or a phone call from my office (if you are not signed up for my chart).  

## 2019-05-14 NOTE — Progress Notes (Signed)
Subjective:  Patient ID: Stephanie Juarez, female    DOB: [birth year]  Age: 69 y.o. MRN: [id number]  CC: Hypertension   HPI Stephanie Juarez  is a 69 year old female with a history of hypertension, hyperlipidemia, obesity, chronic left knee pain, bipolar disorder who presents today  for follow-up visit.  She ran out of her antihypertensive hands elevated blood pressure. She has pain in her arms and legs (worse on rainy days) which is typical of her arthritis and is improved on Naproxen which she uses as needed. Pain is an 8/10 in her shoulders and knees but then she goes on to say it is a 5/10. She exercises a lot at home and has a stepper which she uses for exercise. She has not been taking her statin regularly.  Past Medical History:  Diagnosis Date  . Arthritis   . Hypertension     Past Surgical History:  Procedure Laterality Date  . MANDIBLE SURGERY Left 1981   impacted teeth  . SHOULDER ARTHROSCOPY Left 2001   for cyst    Family History  Problem Relation Age of Onset  . Arthritis Mother   . Heart disease Mother   . Heart disease Father     Allergies  Allergen Reactions  . Tramadol Itching    Outpatient Medications Prior to Visit  Medication Sig Dispense Refill  . amLODipine (NORVASC) 10 MG tablet Take 1 tablet (10 mg total) by mouth daily. 30 tablet 6  . carvedilol (COREG) 3.125 MG tablet Take 1 tablet (3.125 mg total) by mouth 2 (two) times daily with a meal. 60 tablet 6  . naproxen (NAPROSYN) 500 MG tablet Take 1 tablet (500 mg total) by mouth 2 (two) times daily with a meal. 60 tablet 1  . tetrahydrozoline (VISINE) 0.05 % ophthalmic solution Place 1 drop into both eyes daily as needed (dry eyes).    Marland Kitchen atorvastatin (LIPITOR) 20 MG tablet Take 1 tablet (20 mg total) by mouth daily. (Patient not taking: Reported on 04/26/2018) 30 tablet 6  . HYDROcodone-acetaminophen (NORCO/VICODIN) 5-325 MG tablet Take 2 tablets by mouth every 4 (four) hours as needed. (Patient not  taking: Reported on 08/04/2016) 20 tablet 0  . ondansetron (ZOFRAN ODT) 4 MG disintegrating tablet Take 1 tablet (4 mg total) by mouth every 8 (eight) hours as needed for nausea or vomiting. (Patient not taking: Reported on 01/04/2017) 20 tablet 0   No facility-administered medications prior to visit.     ROS Review of Systems  Constitutional: Negative for activity change, appetite change and fatigue.  HENT: Negative for congestion, sinus pressure and sore throat.   Eyes: Negative for visual disturbance.  Respiratory: Negative for cough, chest tightness, shortness of breath and wheezing.   Cardiovascular: Negative for chest pain and palpitations.  Gastrointestinal: Negative for abdominal distention, abdominal pain and constipation.  Endocrine: Negative for polydipsia.  Genitourinary: Negative for dysuria and frequency.  Musculoskeletal:       See HPI  Skin: Negative for rash.  Neurological: Negative for tremors, light-headedness and numbness.  Hematological: Does not bruise/bleed easily.  Psychiatric/Behavioral: Negative for agitation and behavioral problems.    Objective:  BP (!) 149/74   Pulse 71   Temp 98.4 F (36.9 C) (Oral)   Ht 5\' 4"  (1.626 m)   Wt 253 lb (114.8 kg)   SpO2 97%   BMI 43.43 kg/m   BP/Weight 05/14/2019 04/26/2018 69/62/9528  Systolic BP 413 244 010  Diastolic BP 74 76 85  Wt. (Lbs) 253  240 228.6  BMI 43.43 41.2 39.24      Physical Exam Constitutional:      Appearance: She is well-developed.  Neck:     Vascular: No JVD.  Cardiovascular:     Rate and Rhythm: Normal rate.     Heart sounds: Normal heart sounds. No murmur.  Pulmonary:     Effort: Pulmonary effort is normal.     Breath sounds: Normal breath sounds. No wheezing or rales.  Chest:     Chest wall: No tenderness.  Abdominal:     General: Bowel sounds are normal. There is no distension.     Palpations: Abdomen is soft. There is no mass.     Tenderness: There is no abdominal tenderness.   Musculoskeletal:        General: Normal range of motion.     Right lower leg: No edema.     Left lower leg: No edema.  Neurological:     Mental Status: She is alert and oriented to person, place, and time.  Psychiatric:        Mood and Affect: Mood normal.     CMP Latest Ref Rng & Units 03/29/2018 09/27/2017 01/19/2017  Glucose 65 - 99 mg/dL 87 88 90  BUN 8 - 27 mg/dL 13 13 16   Creatinine 0.57 - 1.00 mg/dL 5.59 7.41  Sodium 134 - 144 mmol/L 140 142 144  Potassium 3.5 - 5.2 mmol/L 3.6 3.6 3.4(L)  Chloride 96 - 106 mmol/L 102 105 105  CO2 20 - 29 mmol/L 22 23 22   Calcium 8.7 - 10.3 mg/dL 9.0 9.1 9.0  Total Protein 6.0 - 8.5 g/dL - 7.4 -  Total Bilirubin 0.0 - 1.2 mg/dL - 0.7 -  Alkaline Phos 39 - 117 IU/L - 79 -  AST 0 - 40 IU/L - 19 -  ALT 0 - 32 IU/L - 13 -    Lipid Panel     Component Value Date/Time   CHOL 198 09/27/2017 1004   TRIG 87 09/27/2017 1004   HDL 60 09/27/2017 1004   CHOLHDL 3.3 09/27/2017 1004   CHOLHDL 3.4 05/20/2015 0917   VLDL 13 05/20/2015 0917   LDLCALC 121 (H) 09/27/2017 1004    CBC    Component Value Date/Time   WBC 5.5 01/04/2017 1501   WBC 6.7 03/12/2016 0834   RBC 4.57 01/04/2017 1501   RBC 4.66 03/12/2016 0834   HGB 12.0 01/04/2017 1501   HCT 38.1 01/04/2017 1501   PLT 194 01/04/2017 1501   MCV 83 01/04/2017 1501   MCH 26.3 (L) 01/04/2017 1501   MCH 26.2 03/12/2016 0834   MCHC 31.5 01/04/2017 1501   MCHC 31.0 03/12/2016 0834   RDW 14.2 01/04/2017 1501   LYMPHSABS 2.1 01/04/2017 1501   MONOABS 0.5 07/29/2013 1806   EOSABS 0.3 01/04/2017 1501   BASOSABS 0.1 01/04/2017 1501    Lab Results  Component Value Date   HGBA1C 4.8 01/04/2017    The 10-year ASCVD risk score 01/06/2017 DC Jr., et al., 2013) is: 13.4%   Values used to calculate the score:     Age: 21 years     Sex: Female     Is Non-Hispanic African American: Yes     Diabetic: No     Tobacco smoker: No     Systolic Blood Pressure: 149 mmHg     Is BP treated: Yes      HDL Cholesterol: 68 mg/dL     Total Cholesterol: 205 mg/dL  Assessment & Plan:    1. Essential hypertension Uncontrolled due to running out of antihypertensive which I have refilled Counseled on blood pressure goal of less than 130/80, low-sodium, DASH diet, medication compliance, 150 minutes of moderate intensity exercise per week. Discussed medication compliance, adverse effects. - amLODipine (NORVASC) 10 MG tablet; Take 1 tablet (10 mg total) by mouth daily.  Dispense: 30 tablet; Refill: 6 - carvedilol (COREG) 3.125 MG tablet; Take 1 tablet (3.125 mg total) by mouth 2 (two) times daily with a meal.  Dispense: 60 tablet; Refill: 6  2. Pure hypercholesterolemia Controlled ASCVD risk is 13.4 Continue Lipitor Counseled on low-cholesterol diet - Complete Metabolic Panel with GFR - Lipid panel - atorvastatin (LIPITOR) 20 MG tablet; Take 1 tablet (20 mg total) by mouth daily.  Dispense: 30 tablet; Refill: 6  3. Estrogen deficiency - DG Bone Density; Future  4. Encounter for screening mammogram for malignant neoplasm of breast - MM 3D SCREEN BREAST BILATERAL; Future  5. Chronic pain of left knee Stable Controlled on current regimen  6. Healthcare maintenance - Pneumococcal conjugate vaccine 13-valent  Return in about 6 months (around 11/11/2019) for Chronic medical conditions.     Hoy Register, MD, FAAFP. [facility] [place], [place] [phone]   05/14/2019, 10:50 AM

## 2019-05-15 ENCOUNTER — Encounter: Payer: Self-pay | Admitting: Family Medicine

## 2019-05-15 LAB — CMP14+EGFR
ALT: 11 IU/L (ref 0–32)
AST: 17 IU/L (ref 0–40)
Albumin/Globulin Ratio: 1.2 (ref 1.2–2.2)
Albumin: 4.2 g/dL (ref 3.8–4.8)
Alkaline Phosphatase: 83 IU/L (ref 39–117)
BUN/Creatinine Ratio: 21 (ref 12–28)
BUN: 13 mg/dL (ref 8–27)
Bilirubin Total: 0.7 mg/dL (ref 0.0–1.2)
CO2: 21 mmol/L (ref 20–29)
Calcium: 9.4 mg/dL (ref 8.7–10.3)
Chloride: 104 mmol/L (ref 96–106)
Creatinine, Ser: 0.62 mg/dL (ref 0.57–1.00)
GFR calc Af Amer: 109 mL/min/{1.73_m2} (ref >59)
GFR calc non Af Amer: 94 mL/min/{1.73_m2} (ref >59)
Globulin, Total: 3.5 g/dL (ref 1.5–4.5)
Glucose: 87 mg/dL (ref 65–99)
Potassium: 4 mmol/L (ref 3.5–5.2)
Sodium: 141 mmol/L (ref 134–144)
Total Protein: 7.7 g/dL (ref 6.0–8.5)

## 2019-05-15 LAB — LIPID PANEL
Chol/HDL Ratio: 3 ratio (ref 0.0–4.4)
Cholesterol, Total: 205 mg/dL — ABNORMAL HIGH (ref 100–199)
HDL: 68 mg/dL (ref >39)
LDL Chol Calc (NIH): 125 mg/dL — ABNORMAL HIGH (ref 0–99)
Triglycerides: 64 mg/dL (ref 0–149)
VLDL Cholesterol Cal: 12 mg/dL (ref 5–40)

## 2019-05-17 ENCOUNTER — Telehealth: Payer: Self-pay

## 2019-05-17 NOTE — Telephone Encounter (Signed)
-----   Message from Hoy Register, MD sent at 05/15/2019 12:33 PM EST ----- Cholesterol is slightly elevated.  Please encourage compliance with cholesterol medication and low-cholesterol diet.  Other labs are normal

## 2019-05-17 NOTE — Telephone Encounter (Signed)
Patient name and DOB has been verified Patient was informed of lab results. Patient had no questions.  

## 2019-07-01 MED FILL — AMLODIPINE BESYLATE 10 MG T: 10 | 30 days supply | Qty: 30 | Fill #1

## 2019-07-01 MED FILL — NAPROXEN 500 MG TABLET: 500 | 30 days supply | Qty: 60 | Fill #1

## 2019-07-19 DIAGNOSIS — F603 Borderline personality disorder: Secondary | ICD-10-CM | POA: Diagnosis not present

## 2019-07-19 DIAGNOSIS — F4310 Post-traumatic stress disorder, unspecified: Secondary | ICD-10-CM | POA: Diagnosis not present

## 2019-07-31 DIAGNOSIS — F314 Bipolar disorder, current episode depressed, severe, without psychotic features: Secondary | ICD-10-CM | POA: Diagnosis not present

## 2019-08-02 ENCOUNTER — Other Ambulatory Visit: Payer: Self-pay | Admitting: Family Medicine

## 2019-08-02 MED FILL — AMLODIPINE BESYLATE 10 MG T: 10 | 30 days supply | Qty: 30 | Fill #2

## 2019-08-02 MED FILL — NAPROXEN 500 MG TABLET: 500 | 30 days supply | Qty: 60 | Fill #0

## 2019-08-07 ENCOUNTER — Telehealth: Payer: Self-pay | Admitting: Family Medicine

## 2019-08-07 MED ORDER — IBUPROFEN 600 MG PO TABS: 600.0000 mg | ORAL_TABLET | Freq: Two times a day (BID) | ORAL | 2 refills | Status: DC | PRN

## 2019-08-07 NOTE — Telephone Encounter (Signed)
Patient came in saying she was prescribed naproxen (NAPROSYN) 500 MG tablet Patient states that the medication is not helping her and would like to be prescribed ibuprofen 600mg . Please f/u

## 2019-08-07 NOTE — Telephone Encounter (Signed)
Patient would like to change from Naproxen to ibuprofen.

## 2019-08-07 NOTE — Telephone Encounter (Signed)
Done

## 2019-08-08 MED FILL — IBUPROFEN 600 MG TABLET: 600 | 30 days supply | Qty: 60 | Fill #0

## 2019-08-08 NOTE — Telephone Encounter (Signed)
Patient was called and informed of medication change, and medication being sent to pharmacy.

## 2019-08-19 ENCOUNTER — Ambulatory Visit: Payer: Medicare HMO | Attending: Family Medicine | Admitting: Licensed Clinical Social Worker

## 2019-08-19 ENCOUNTER — Other Ambulatory Visit: Payer: Self-pay

## 2019-08-19 DIAGNOSIS — F4323 Adjustment disorder with mixed anxiety and depressed mood: Secondary | ICD-10-CM | POA: Diagnosis not present

## 2019-08-22 DIAGNOSIS — F314 Bipolar disorder, current episode depressed, severe, without psychotic features: Secondary | ICD-10-CM | POA: Diagnosis not present

## 2019-08-23 DIAGNOSIS — F314 Bipolar disorder, current episode depressed, severe, without psychotic features: Secondary | ICD-10-CM | POA: Diagnosis not present

## 2019-08-28 ENCOUNTER — Ambulatory Visit: Payer: Medicare HMO | Attending: Family Medicine | Admitting: Family Medicine

## 2019-08-28 ENCOUNTER — Other Ambulatory Visit: Payer: Self-pay

## 2019-08-28 ENCOUNTER — Encounter: Payer: Self-pay | Admitting: Family Medicine

## 2019-08-28 VITALS — BP 152/80 | HR 78 | Ht 64.0 in | Wt 251.0 lb

## 2019-08-28 DIAGNOSIS — F3176 Bipolar disorder, in full remission, most recent episode depressed: Secondary | ICD-10-CM

## 2019-08-28 DIAGNOSIS — R42 Dizziness and giddiness: Secondary | ICD-10-CM | POA: Diagnosis not present

## 2019-08-28 DIAGNOSIS — Z6841 Body Mass Index (BMI) 40.0 and over, adult: Secondary | ICD-10-CM | POA: Diagnosis not present

## 2019-08-28 MED ORDER — CETIRIZINE HCL 10 MG PO TABS: 10.0000 mg | ORAL_TABLET | Freq: Every day | ORAL | 1 refills | Status: DC
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
## 2019-08-28 NOTE — Patient Instructions (Signed)
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
## 2019-09-27 DIAGNOSIS — F314 Bipolar disorder, current episode depressed, severe, without psychotic features: Secondary | ICD-10-CM | POA: Diagnosis not present

## 2019-09-27 MED FILL — AMLODIPINE BESYLATE 10 MG T: 10 | 30 days supply | Qty: 30 | Fill #3

## 2019-09-27 MED FILL — IBUPROFEN 600 MG TABLET: 600 | 30 days supply | Qty: 60 | Fill #1

## 2019-10-01 DIAGNOSIS — F331 Major depressive disorder, recurrent, moderate: Secondary | ICD-10-CM | POA: Diagnosis not present

## 2019-10-02 ENCOUNTER — Telehealth: Payer: Self-pay | Admitting: Licensed Clinical Social Worker

## 2019-10-02 NOTE — Telephone Encounter (Signed)
LCSW completed follow up meeting with Legal Aid. A file has been opened for further review.

## 2019-10-08 DIAGNOSIS — F331 Major depressive disorder, recurrent, moderate: Secondary | ICD-10-CM | POA: Diagnosis not present

## 2019-11-04 DIAGNOSIS — F331 Major depressive disorder, recurrent, moderate: Secondary | ICD-10-CM | POA: Diagnosis not present

## 2019-11-18 ENCOUNTER — Other Ambulatory Visit: Payer: Self-pay | Admitting: Family Medicine

## 2019-11-18 DIAGNOSIS — I10 Essential (primary) hypertension: Secondary | ICD-10-CM

## 2019-11-18 MED ORDER — IBUPROFEN 600 MG PO TABS: 600.0000 mg | ORAL_TABLET | Freq: Two times a day (BID) | ORAL | 2 refills | Status: DC | PRN

## 2019-11-18 MED ORDER — AMLODIPINE BESYLATE 10 MG PO TABS: 10.0000 mg | ORAL_TABLET | Freq: Every day | ORAL | 6 refills | Status: DC

## 2019-11-18 MED FILL — IBUPROFEN 600 MG TABLET: 600 | 30 days supply | Qty: 60 | Fill #0

## 2019-11-18 MED FILL — AMLODIPINE BESYLATE 10 MG T: 10 | 30 days supply | Qty: 30 | Fill #0

## 2019-11-18 NOTE — Telephone Encounter (Signed)
Copied from CRM [id number]. Topic: Quick Communication - Rx Refill/Question >> Nov 18, 2019 10:54 AM Mcneil, Jannifer Rodney wrote: Medication: amLODipine (NORVASC) 10 MG tablet and ibuprofen (ADVIL) 600 MG tablet  Has the patient contacted their pharmacy? no  Preferred Pharmacy (with phone number or street name): [facility] - [place], [place] - [address]  Phone: [phone]  Fax: [phone]  Agent: Please be advised that RX refills may take up to 3 business days. We ask that you follow-up with your pharmacy.

## 2020-01-06 MED FILL — AMLODIPINE BESYLATE 10 MG T: 10 | 30 days supply | Qty: 30 | Fill #1

## 2020-01-06 MED FILL — IBUPROFEN 600 MG TABLET: 600 | 30 days supply | Qty: 60 | Fill #1

## 2020-01-30 DIAGNOSIS — F331 Major depressive disorder, recurrent, moderate: Secondary | ICD-10-CM | POA: Diagnosis not present

## 2020-02-14 MED FILL — AMLODIPINE BESYLATE 10 MG T: 10 | 30 days supply | Qty: 30 | Fill #2

## 2020-02-14 MED FILL — IBUPROFEN 600 MG TABLET: 600 | 30 days supply | Qty: 60 | Fill #2

## 2020-03-23 ENCOUNTER — Other Ambulatory Visit: Payer: Self-pay

## 2020-03-23 ENCOUNTER — Other Ambulatory Visit: Payer: Self-pay | Admitting: Family Medicine

## 2020-03-23 ENCOUNTER — Ambulatory Visit: Payer: Medicare HMO | Attending: Family Medicine | Admitting: Family Medicine

## 2020-03-23 ENCOUNTER — Encounter: Payer: Self-pay | Admitting: Family Medicine

## 2020-03-23 VITALS — BP 143/74 | HR 74 | Ht 64.0 in | Wt 240.0 lb

## 2020-03-23 DIAGNOSIS — M1909 Primary osteoarthritis, other specified site: Secondary | ICD-10-CM | POA: Diagnosis not present

## 2020-03-23 DIAGNOSIS — R42 Dizziness and giddiness: Secondary | ICD-10-CM | POA: Diagnosis not present

## 2020-03-23 DIAGNOSIS — E7800 Pure hypercholesterolemia, unspecified: Secondary | ICD-10-CM

## 2020-03-23 DIAGNOSIS — F4323 Adjustment disorder with mixed anxiety and depressed mood: Secondary | ICD-10-CM | POA: Diagnosis not present

## 2020-03-23 DIAGNOSIS — I10 Essential (primary) hypertension: Secondary | ICD-10-CM | POA: Diagnosis not present

## 2020-03-23 MED ORDER — FLUOXETINE HCL 40 MG PO CAPS: 40.0000 mg | ORAL_CAPSULE | Freq: Every day | ORAL | 6 refills | Status: DC

## 2020-03-23 MED ORDER — MELOXICAM 7.5 MG PO TABS: 7.5000 mg | ORAL_TABLET | Freq: Every day | ORAL | 2 refills | Status: DC

## 2020-03-23 MED ORDER — CARVEDILOL 3.125 MG PO TABS: 3.1250 mg | ORAL_TABLET | Freq: Two times a day (BID) | ORAL | 6 refills | Status: DC

## 2020-03-23 MED ORDER — CETIRIZINE HCL 10 MG PO TABS: 10.0000 mg | ORAL_TABLET | Freq: Every day | ORAL | 1 refills | Status: AC

## 2020-03-23 MED ORDER — AMLODIPINE BESYLATE 10 MG PO TABS: 10.0000 mg | ORAL_TABLET | Freq: Every day | ORAL | 6 refills | Status: DC

## 2020-03-23 MED ORDER — ATORVASTATIN CALCIUM 20 MG PO TABS: 20.0000 mg | ORAL_TABLET | Freq: Every day | ORAL | 6 refills | Status: DC

## 2020-03-23 MED FILL — CARVEDILOL 3.125 MG TABLET: 3.125 | 30 days supply | Qty: 60 | Fill #0

## 2020-03-23 MED FILL — AMLODIPINE BESYLATE 10 MG T: 10 | 30 days supply | Qty: 30 | Fill #0

## 2020-03-23 MED FILL — MELOXICAM 7.5 MG TABLET: 7.5 | 30 days supply | Qty: 30 | Fill #0

## 2020-03-23 MED FILL — ATORVASTATIN CALCIUM 20 MG: 20 | 30 days supply | Qty: 30 | Fill #0

## 2020-03-23 NOTE — Progress Notes (Signed)
Wants to continue taking Prozac, but would like medication to help with the diarrhea.  Having pain in legs.

## 2020-03-23 NOTE — Progress Notes (Signed)
Subjective:  Patient ID: Stephanie Juarez, female    DOB: 12/22/1950  Age: 69 y.o. MRN: [id number]  CC: Hypertension   HPI Stephanie Juarez is a 69 year old female with a history of hypertension, hyperlipidemia, obesity, chronic left knee pain, bipolar disorder who presents todayfor follow-up visit [facility] and was prescribed Prozac 40mg  but is unable to afford her care at [facility].  Her anxiety and depression have been controlled on Prilosec. Would like to try Meloxicam as Ibuprofen in addition to Prozac causes diarrhea Has OA of knee and R hip for which she has been on Ibuprofen.  She has been compliant with Amlodipine but not with Coreg as she was unaware she was supposed to be on it. She has also not been taking her Lipitor 'because her cholesterol was normal.' Past Medical History:  Diagnosis Date  . Arthritis   . Hypertension     Past Surgical History:  Procedure Laterality Date  . MANDIBLE SURGERY Left 1981   impacted teeth  . SHOULDER ARTHROSCOPY Left 2001   for cyst    Family History  Problem Relation Age of Onset  . Arthritis Mother   . Heart disease Mother   . Heart disease Father     Allergies  Allergen Reactions  . Tramadol Itching  . Vicks Nyquil Cold & Flu Night [[id number]]     itch    Outpatient Medications Prior to Visit  Medication Sig Dispense Refill  . amLODipine (NORVASC) 10 MG tablet Take 1 tablet (10 mg total) by mouth daily. 30 tablet 6  . buPROPion (WELLBUTRIN) 100 MG tablet Take 100 mg by mouth daily. (Patient not taking: Reported on 03/23/2020)    . HYDROcodone-acetaminophen (NORCO/VICODIN) 5-325 MG tablet Take 2 tablets by mouth every 4 (four) hours as needed. (Patient not taking: Reported on 08/04/2016) 20 tablet 0  . ondansetron (ZOFRAN ODT) 4 MG disintegrating tablet Take 1 tablet (4 mg total) by mouth every 8 (eight) hours as needed for nausea or vomiting. (Patient not taking: Reported on  01/04/2017) 20 tablet 0  . tetrahydrozoline (VISINE) 0.05 % ophthalmic solution Place 1 drop into both eyes daily as needed (dry eyes). (Patient not taking: Reported on 03/23/2020)    . atorvastatin (LIPITOR) 20 MG tablet Take 1 tablet (20 mg total) by mouth daily. (Patient not taking: Reported on 03/23/2020) 30 tablet 6  . carvedilol (COREG) 3.125 MG tablet Take 1 tablet (3.125 mg total) by mouth 2 (two) times daily with a meal. 60 tablet 6  . cetirizine (ZYRTEC) 10 MG tablet Take 1 tablet (10 mg total) by mouth daily. (Patient not taking: Reported on 03/23/2020) 30 tablet 1  . ibuprofen (ADVIL) 600 MG tablet Take 1 tablet (600 mg total) by mouth every 12 (twelve) hours as needed. (Patient not taking: Reported on 03/23/2020) 60 tablet 2   No facility-administered medications prior to visit.     ROS Review of Systems  Constitutional: Negative for activity change, appetite change and fatigue.  HENT: Negative for congestion, sinus pressure and sore throat.   Eyes: Negative for visual disturbance.  Respiratory: Negative for cough, chest tightness, shortness of breath and wheezing.   Cardiovascular: Negative for chest pain and palpitations.  Gastrointestinal: Negative for abdominal distention, abdominal pain and constipation.  Endocrine: Negative for polydipsia.  Genitourinary: Negative for dysuria and frequency.  Musculoskeletal: Negative for arthralgias and back pain.  Skin: Negative for rash.  Neurological: Negative for tremors, light-headedness and numbness.  Hematological: Does not bruise/bleed easily.  Psychiatric/Behavioral: Negative for agitation and behavioral problems.    Objective:  BP (!) 143/74   Pulse 74   Ht 5\' 4"  (1.626 m)   Wt 240 lb (108.9 kg)   SpO2 98%   BMI 41.20 kg/m   BP/Weight 03/23/2020 08/28/2019 05/14/2019  Systolic BP 143 152 149  Diastolic BP 74 80 74  Wt. (Lbs) 240 251 253  BMI 41.2 43.08 43.43      Physical Exam Constitutional:      Appearance:  She is well-developed.  Neck:     Vascular: No JVD.  Cardiovascular:     Rate and Rhythm: Normal rate.     Heart sounds: Normal heart sounds. No murmur heard.   Pulmonary:     Effort: Pulmonary effort is normal.     Breath sounds: Normal breath sounds. No wheezing or rales.  Chest:     Chest wall: No tenderness.  Abdominal:     General: Bowel sounds are normal. There is no distension.     Palpations: Abdomen is soft. There is no mass.     Tenderness: There is no abdominal tenderness.  Musculoskeletal:        General: Normal range of motion.     Right lower leg: No edema.     Left lower leg: No edema.  Neurological:     Mental Status: She is alert and oriented to person, place, and time.  Psychiatric:        Mood and Affect: Mood normal.     CMP Latest Ref Rng & Units 05/14/2019 03/29/2018 09/27/2017  Glucose 65 - 99 mg/dL 87 87 88  BUN 8 - 27 mg/dL 13 13 13   Creatinine 0.57 - 1.00 mg/dL 09/29/2017 0.86  Sodium 134 - 144 mmol/L 141 140 142  Potassium 3.5 - 5.2 mmol/L 4.0 3.6 3.6  Chloride 96 - 106 mmol/L 104 102 105  CO2 20 - 29 mmol/L 21 22 23   Calcium 8.7 - 10.3 mg/dL 9.4 9.0 9.1  Total Protein 6.0 - 8.5 g/dL 7.7 - 7.4  Total Bilirubin 0.0 - 1.2 mg/dL 0.7 - 0.7  Alkaline Phos 39 - 117 IU/L 83 - 79  AST 0 - 40 IU/L 17 - 19  ALT 0 - 32 IU/L 11 - 13    Lipid Panel     Component Value Date/Time   CHOL 205 (H) 05/14/2019 1131   TRIG 64 05/14/2019 1131   HDL 68 05/14/2019 1131   CHOLHDL 3.0 05/14/2019 1131   CHOLHDL 3.4 05/20/2015 0917   VLDL 13 05/20/2015 0917   LDLCALC 125 (H) 05/14/2019 1131    CBC    Component Value Date/Time   WBC 5.5 01/04/2017 1501   WBC 6.7 03/12/2016 0834   RBC 4.57 01/04/2017 1501   RBC 4.66 03/12/2016 0834   HGB 12.0 01/04/2017 1501   HCT 38.1 01/04/2017 1501   PLT 194 01/04/2017 1501   MCV 83 01/04/2017 1501   MCH 26.3 (L) 01/04/2017 1501   MCH 26.2 03/12/2016 0834   MCHC 31.5 01/04/2017 1501   MCHC 31.0 03/12/2016 0834   RDW  14.2 01/04/2017 1501   LYMPHSABS 2.1 01/04/2017 1501   MONOABS 0.5 07/29/2013 1806   EOSABS 0.3 01/04/2017 1501   BASOSABS 0.1 01/04/2017 1501    Lab Results  Component Value Date   HGBA1C 4.8 01/04/2017    Assessment & Plan:  1. Essential hypertension Above goal Advised to restart carvedilol Counseled on blood pressure  goal of less than 130/80, low-sodium, DASH diet, medication compliance, 150 minutes of moderate intensity exercise per week. Discussed medication compliance, adverse effects. - carvedilol (COREG) 3.125 MG tablet; Take 1 tablet (3.125 mg total) by mouth 2 (two) times daily with a meal.  Dispense: 60 tablet; Refill: 6 - amLODipine (NORVASC) 10 MG tablet; Take 1 tablet (10 mg total) by mouth daily.  Dispense: 30 tablet; Refill: 6 - Basic Metabolic Panel  2. Pure hypercholesterolemia Previously controlled She has been off Lipitor has been advised to restart Low-cholesterol diet - atorvastatin (LIPITOR) 20 MG tablet; Take 1 tablet (20 mg total) by mouth daily.  Dispense: 30 tablet; Refill: 6  3. Dizziness Dizziness thought to be sinus related from previous visit - cetirizine (ZYRTEC) 10 MG tablet; Take 1 tablet (10 mg total) by mouth daily.  Dispense: 30 tablet; Refill: 1  4. Adjustment disorder with mixed anxiety and depressed mood Stable - FLUoxetine (PROZAC) 40 MG capsule; Take 1 capsule (40 mg total) by mouth daily.  Dispense: 30 capsule; Refill: 6  5. Primary osteoarthritis of other site Discontinue ibuprofen Advised that NSAIDs are likely to interact with SSRI She previously had no relief with topical NSAID Weight loss will be beneficial - meloxicam (MOBIC) 7.5 MG tablet; Take 1 tablet (7.5 mg total) by mouth daily.  Dispense: 30 tablet; Refill: 2    Meds ordered this encounter  Medications  . carvedilol (COREG) 3.125 MG tablet    Sig: Take 1 tablet (3.125 mg total) by mouth 2 (two) times daily with a meal.    Dispense:  60 tablet    Refill:  6  .  amLODipine (NORVASC) 10 MG tablet    Sig: Take 1 tablet (10 mg total) by mouth daily.    Dispense:  30 tablet    Refill:  6    Discontinue previous dose  . meloxicam (MOBIC) 7.5 MG tablet    Sig: Take 1 tablet (7.5 mg total) by mouth daily.    Dispense:  30 tablet    Refill:  2  . atorvastatin (LIPITOR) 20 MG tablet    Sig: Take 1 tablet (20 mg total) by mouth daily.    Dispense:  30 tablet    Refill:  6  . FLUoxetine (PROZAC) 40 MG capsule    Sig: Take 1 capsule (40 mg total) by mouth daily.    Dispense:  30 capsule    Refill:  6  . cetirizine (ZYRTEC) 10 MG tablet    Sig: Take 1 tablet (10 mg total) by mouth daily.    Dispense:  30 tablet    Refill:  1    Follow-up: Return in about 6 months (around 09/20/2020) for Chronic disease management.       Hoy Register, MD, FAAFP. [facility] [place], [place] [phone]   03/23/2020, 5:10 PM

## 2020-03-24 LAB — BASIC METABOLIC PANEL
BUN/Creatinine Ratio: 17 (ref 12–28)
BUN: 16 mg/dL (ref 8–27)
CO2: 24 mmol/L (ref 20–29)
Calcium: 9.5 mg/dL (ref 8.7–10.3)
Chloride: 104 mmol/L (ref 96–106)
Creatinine, Ser: 0.92 mg/dL (ref 0.57–1.00)
GFR calc Af Amer: 75 mL/min/{1.73_m2} (ref >59)
GFR calc non Af Amer: 65 mL/min/{1.73_m2} (ref >59)
Glucose: 85 mg/dL (ref 65–99)
Potassium: 4.2 mmol/L (ref 3.5–5.2)
Sodium: 142 mmol/L (ref 134–144)

## 2020-03-27 ENCOUNTER — Telehealth: Payer: Self-pay | Admitting: Family Medicine

## 2020-03-27 ENCOUNTER — Telehealth: Payer: Self-pay

## 2020-03-27 NOTE — Telephone Encounter (Signed)
-----   Message from Hoy Register, MD sent at 03/24/2020  8:46 AM EST ----- Please inform the patient that labs are normal. Thank you.

## 2020-03-27 NOTE — Telephone Encounter (Signed)
Patient returned call and was read lab note By Dr Alvis Lemmings written 03/24/20.  She verbalized understanding.

## 2020-03-27 NOTE — Telephone Encounter (Signed)
Pt was called and voicemail is currently full.

## 2020-06-10 MED FILL — AMLODIPINE BESYLATE 10 MG T: 10 | 30 days supply | Qty: 30 | Fill #1

## 2020-06-12 MED FILL — NAPROXEN 500 MG TABLET: 500 | 30 days supply | Qty: 60 | Fill #1

## 2020-06-15 ENCOUNTER — Telehealth: Payer: Self-pay | Admitting: Family Medicine

## 2020-06-15 DIAGNOSIS — F4323 Adjustment disorder with mixed anxiety and depressed mood: Secondary | ICD-10-CM

## 2020-06-15 MED FILL — FLUoxetine HCL 40 MG CAPS: 40 | 30 days supply | Qty: 30 | Fill #0

## 2020-06-15 NOTE — Telephone Encounter (Signed)
Copied from CRM [id number]. Topic: General - Inquiry >> Jun 15, 2020  4:54 PM Daphine Deutscher D wrote: Reason for CRM: Pt called asking if Dr. Alvis Lemmings can increase her medication of Prozac to 60 mg

## 2020-06-15 NOTE — Telephone Encounter (Signed)
Will route to PCP for review. 

## 2020-06-16 ENCOUNTER — Other Ambulatory Visit: Payer: Self-pay | Admitting: Family Medicine

## 2020-06-16 MED ORDER — FLUOXETINE HCL 20 MG PO CAPS
60.0000 mg | ORAL_CAPSULE | Freq: Every day | ORAL | 6 refills | Status: DC
Start: 2020-06-16 — End: 2020-06-16

## 2020-06-16 MED FILL — FLUoxetine HCL 20 MG CAPS: 20 | 30 days supply | Qty: 90 | Fill #0

## 2020-06-16 NOTE — Telephone Encounter (Signed)
I have sen a rx for 60mg  of Prozac to her Pharmacy.

## 2020-06-16 NOTE — Addendum Note (Signed)
Addended by: Hoy Register on: 06/16/2020 08:24 AM   Modules accepted: Orders

## 2020-06-16 NOTE — Telephone Encounter (Signed)
Pt was called and VM is currently full. Medication has been sent to pharmacy.

## 2020-08-08 ENCOUNTER — Other Ambulatory Visit: Payer: Self-pay

## 2020-09-09 ENCOUNTER — Other Ambulatory Visit: Payer: Self-pay

## 2020-09-09 MED FILL — Meloxicam Tab 7.5 MG: ORAL | 30 days supply | Qty: 30 | Fill #0 | Status: AC

## 2020-09-09 MED FILL — Amlodipine Besylate Tab 10 MG (Base Equivalent): ORAL | 30 days supply | Qty: 30 | Fill #0 | Status: AC

## 2020-09-09 MED FILL — Carvedilol Tab 3.125 MG: ORAL | 30 days supply | Qty: 60 | Fill #0 | Status: AC

## 2020-09-10 ENCOUNTER — Other Ambulatory Visit: Payer: Self-pay

## 2020-09-28 ENCOUNTER — Other Ambulatory Visit: Payer: Self-pay

## 2020-09-28 MED FILL — Fluoxetine HCl Cap 20 MG: ORAL | 30 days supply | Qty: 90 | Fill #0 | Status: AC

## 2020-10-02 ENCOUNTER — Other Ambulatory Visit: Payer: Self-pay

## 2020-10-26 ENCOUNTER — Other Ambulatory Visit: Payer: Self-pay | Admitting: Family Medicine

## 2020-10-26 DIAGNOSIS — I10 Essential (primary) hypertension: Secondary | ICD-10-CM

## 2020-10-26 DIAGNOSIS — E7800 Pure hypercholesterolemia, unspecified: Secondary | ICD-10-CM

## 2020-10-26 DIAGNOSIS — M1909 Primary osteoarthritis, other specified site: Secondary | ICD-10-CM

## 2020-10-26 MED ORDER — CARVEDILOL 3.125 MG PO TABS: 3.1250 mg | ORAL_TABLET | Freq: Two times a day (BID) | ORAL | 0 refills | Status: DC

## 2020-10-26 MED ORDER — ATORVASTATIN CALCIUM 20 MG PO TABS: 20.0000 mg | ORAL_TABLET | Freq: Every day | ORAL | 1 refills | Status: DC

## 2020-10-26 MED ORDER — MELOXICAM 7.5 MG PO TABS: 7.5000 mg | ORAL_TABLET | Freq: Every day | ORAL | 1 refills | Status: DC

## 2020-10-26 MED ORDER — FLUOXETINE HCL 20 MG PO CAPS: 20.0000 mg | ORAL_CAPSULE | Freq: Every day | ORAL | 0 refills | Status: DC

## 2020-10-26 NOTE — Telephone Encounter (Signed)
Medication Refill - Medication: atorvastatin (LIPITOR) 20 MG tablet carvedilol (COREG) 3.125 MG tablet FLUoxetine (PROZAC) 20 MG capsule meloxicam (MOBIC) 7.5 MG tablet    Preferred Pharmacy (with phone number or street name): [facility] - [place], [place] - [address]   Agent: Please be advised that RX refills may take up to 3 business days. We ask that you follow-up with your pharmacy.

## 2020-10-26 NOTE — Telephone Encounter (Signed)
Patient called and advised she is past due for her 6 month f/u as noted last OV note to return around 09/27/20. Advised will need to schedule first available in order to receive medications. Patient verbalized understanding. Appointment scheduled for Monday 12/28/20 at 1610.

## 2020-11-10 ENCOUNTER — Telehealth: Payer: Self-pay | Admitting: Family Medicine

## 2020-11-10 NOTE — Telephone Encounter (Signed)
Pt has refills on her medication at the pharmacy here.

## 2020-11-10 NOTE — Telephone Encounter (Signed)
Pt needs a new refill on her Amlodipine .  Her insurance keeps calling her to call her primary to have a new rx.  She wants to use [facility]  pharm[facility]  CB# [phone]

## 2020-11-11 NOTE — Telephone Encounter (Signed)
Tobi Bastos calling from [employer] regarding the script for Amlodipine. Advised that pt called yesterday for script to be sent be sent to [facility] pharmacy.

## 2020-11-12 ENCOUNTER — Other Ambulatory Visit: Payer: Self-pay

## 2020-11-12 MED FILL — Amlodipine Besylate Tab 10 MG (Base Equivalent): ORAL | 30 days supply | Qty: 30 | Fill #1 | Status: AC

## 2020-11-16 ENCOUNTER — Other Ambulatory Visit: Payer: Self-pay

## 2020-12-01 ENCOUNTER — Ambulatory Visit: Payer: Medicare HMO

## 2020-12-18 ENCOUNTER — Other Ambulatory Visit: Payer: Self-pay | Admitting: Family Medicine

## 2020-12-18 DIAGNOSIS — I10 Essential (primary) hypertension: Secondary | ICD-10-CM

## 2020-12-18 NOTE — Telephone Encounter (Signed)
Requested medications are due for refill today yes (Mail order)  Requested medications are on the active medication list yes  Last refill 10/27/20 (mail order)  Last visit 03/2020  Future visit scheduled No show 7/26 after curtesy refill, does have appt coming up 01/03/21  Notes to clinic Please assess.

## 2020-12-28 ENCOUNTER — Ambulatory Visit: Payer: Medicare HMO | Attending: Family Medicine | Admitting: Family Medicine

## 2020-12-28 ENCOUNTER — Other Ambulatory Visit: Payer: Self-pay

## 2020-12-28 ENCOUNTER — Encounter: Payer: Self-pay | Admitting: Family Medicine

## 2020-12-28 VITALS — BP 151/80 | HR 70 | Ht 64.0 in | Wt 227.6 lb

## 2020-12-28 DIAGNOSIS — M1909 Primary osteoarthritis, other specified site: Secondary | ICD-10-CM | POA: Diagnosis not present

## 2020-12-28 DIAGNOSIS — R269 Unspecified abnormalities of gait and mobility: Secondary | ICD-10-CM | POA: Diagnosis not present

## 2020-12-28 DIAGNOSIS — Z1231 Encounter for screening mammogram for malignant neoplasm of breast: Secondary | ICD-10-CM

## 2020-12-28 DIAGNOSIS — E2839 Other primary ovarian failure: Secondary | ICD-10-CM

## 2020-12-28 DIAGNOSIS — I10 Essential (primary) hypertension: Secondary | ICD-10-CM

## 2020-12-28 DIAGNOSIS — Z6839 Body mass index (BMI) 39.0-39.9, adult: Secondary | ICD-10-CM

## 2020-12-28 DIAGNOSIS — R42 Dizziness and giddiness: Secondary | ICD-10-CM

## 2020-12-28 DIAGNOSIS — F3176 Bipolar disorder, in full remission, most recent episode depressed: Secondary | ICD-10-CM

## 2020-12-28 MED ORDER — FLUOXETINE HCL 20 MG PO CAPS
60.0000 mg | ORAL_CAPSULE | Freq: Every day | ORAL | 1 refills | Status: DC
Start: 2020-12-28 — End: 2020-12-28
  Filled 2020-12-28: qty 270, 90d supply, fill #0

## 2020-12-28 MED ORDER — AMLODIPINE BESYLATE 10 MG PO TABS
ORAL_TABLET | Freq: Every day | ORAL | 1 refills | Status: DC
Start: 2020-12-28 — End: 2021-05-07

## 2020-12-28 MED ORDER — FLUOXETINE HCL 20 MG PO CAPS: 60.0000 mg | ORAL_CAPSULE | Freq: Every day | ORAL | 1 refills | Status: DC

## 2020-12-28 MED ORDER — CARVEDILOL 3.125 MG PO TABS
3.1250 mg | ORAL_TABLET | Freq: Two times a day (BID) | ORAL | 1 refills | Status: DC
  Filled 2020-12-28: qty 180, 90d supply, fill #0

## 2020-12-28 MED ORDER — CARVEDILOL 3.125 MG PO TABS: 3.1250 mg | ORAL_TABLET | Freq: Two times a day (BID) | ORAL | 1 refills | Status: DC

## 2020-12-28 MED ORDER — MELOXICAM 7.5 MG PO TABS
7.5000 mg | ORAL_TABLET | Freq: Every day | ORAL | 1 refills | Status: DC
  Filled 2020-12-28: qty 90, 90d supply, fill #0

## 2020-12-28 MED ORDER — AMLODIPINE BESYLATE 10 MG PO TABS
ORAL_TABLET | Freq: Every day | ORAL | 1 refills | Status: DC
  Filled 2020-12-28: qty 90, fill #0

## 2020-12-28 MED ORDER — MELOXICAM 7.5 MG PO TABS: 7.5000 mg | ORAL_TABLET | Freq: Every day | ORAL | 1 refills | Status: DC

## 2020-12-28 NOTE — Patient Instructions (Signed)

## 2020-12-28 NOTE — Progress Notes (Addendum)
Subjective:  Patient ID: Stephanie Juarez, female    DOB: 07/29/1950  Age: 70 y.o. MRN: [id number]  CC: Hypertension   HPI Stephanie Juarez is a 70 y.o. year old female with a history of hypertension, hyperlipidemia, obesity, chronic left knee pain, bipolar disorder who presents today  for follow-up visit.  Interval History: She has been feeling off balance and Zyrtec had been previously prescribed for her for which she is requesting a refill.  She describes abnormal gait and deviating from one side to the other side for close to 1 year.  Denies presence of headache or blurry vision and has not had a recent head CT.  She is on Prozac 3m daily for her depression and states she feels better on this than on 40 mg.  Previously followed by the [facility] but can no longer afford care there.  She is due for a mammogram and bone density study.  BP is 1809systolic at home but is elevated today and she endorses not taking her antihypertensives. Past Medical History:  Diagnosis Date   Arthritis    Hypertension     Past Surgical History:  Procedure Laterality Date   MANDIBLE SURGERY Left 1981   impacted teeth   SHOULDER ARTHROSCOPY Left 2001   for cyst    Family History  Problem Relation Age of Onset   Arthritis Mother    Heart disease Mother    Heart disease Father     Allergies  Allergen Reactions   Tramadol Itching   Vicks Nyquil Cold & Flu Night [Dm-Apap-Cpm]     itch    Outpatient Medications Prior to Visit  Medication Sig Dispense Refill   atorvastatin (LIPITOR) 20 MG tablet Take 1 tablet (20 mg total) by mouth daily. 90 tablet 1   buPROPion (WELLBUTRIN) 100 MG tablet Take 100 mg by mouth daily.     cetirizine (ZYRTEC) 10 MG tablet Take 1 tablet (10 mg total) by mouth daily. 30 tablet 1   amLODipine (NORVASC) 10 MG tablet TAKE 1 TABLET (10 MG TOTAL) BY MOUTH DAILY. 30 tablet 6   carvedilol (COREG) 3.125 MG tablet TAKE 1 TABLET TWICE DAILY WITH MEALS 60 tablet 0    FLUoxetine (PROZAC) 20 MG capsule Take 1 capsule (20 mg total) by mouth daily. 60 capsule 0   meloxicam (MOBIC) 7.5 MG tablet Take 1 tablet (7.5 mg total) by mouth daily. 90 tablet 1   HYDROcodone-acetaminophen (NORCO/VICODIN) 5-325 MG tablet Take 2 tablets by mouth every 4 (four) hours as needed. (Patient not taking: No sig reported) 20 tablet 0   ondansetron (ZOFRAN ODT) 4 MG disintegrating tablet Take 1 tablet (4 mg total) by mouth every 8 (eight) hours as needed for nausea or vomiting. (Patient not taking: No sig reported) 20 tablet 0   tetrahydrozoline 0.05 % ophthalmic solution Place 1 drop into both eyes daily as needed (dry eyes). (Patient not taking: No sig reported)     No facility-administered medications prior to visit.     ROS Review of Systems  Constitutional:  Negative for activity change, appetite change and fatigue.  HENT:  Negative for congestion, sinus pressure and sore throat.   Eyes:  Negative for visual disturbance.  Respiratory:  Negative for cough, chest tightness, shortness of breath and wheezing.   Cardiovascular:  Negative for chest pain and palpitations.  Gastrointestinal:  Negative for abdominal distention, abdominal pain and constipation.  Endocrine: Negative for polydipsia.  Genitourinary:  Negative for dysuria and frequency.  Musculoskeletal:  Negative for arthralgias and back pain.  Skin:  Negative for rash.  Neurological:  Positive for light-headedness. Negative for tremors and numbness.  Hematological:  Does not bruise/bleed easily.  Psychiatric/Behavioral:  Negative for agitation and behavioral problems.    Objective:  BP (!) 151/80   Pulse 70   Ht '5\' 4"'  (1.626 m)   Wt 227 lb 9.6 oz (103.2 kg)   SpO2 97%   BMI 39.07 kg/m   BP/Weight 12/28/2020 03/23/2020 5/52/1747  Systolic BP 159 539 672  Diastolic BP 80 74 80  Wt. (Lbs) 227.6 240 251  BMI 39.07 41.2 43.08      Physical Exam Constitutional:      Appearance: She is well-developed.   Cardiovascular:     Rate and Rhythm: Normal rate.     Heart sounds: Normal heart sounds. No murmur heard. Pulmonary:     Effort: Pulmonary effort is normal.     Breath sounds: Normal breath sounds. No wheezing or rales.  Chest:     Chest wall: No tenderness.  Abdominal:     General: Bowel sounds are normal. There is no distension.     Palpations: Abdomen is soft. There is no mass.     Tenderness: There is no abdominal tenderness.  Musculoskeletal:        General: Normal range of motion.     Right lower leg: No edema.     Left lower leg: No edema.  Neurological:     Mental Status: She is alert and oriented to person, place, and time.     Gait: Gait normal.  Psychiatric:        Mood and Affect: Mood normal.    CMP Latest Ref Rng & Units 03/23/2020 05/14/2019 03/29/2018  Glucose 65 - 99 mg/dL 85 87 87  BUN 8 - 27 mg/dL '16 13 13  ' Creatinine 0.57 - 1.00 mg/dL 0.92 0.62 0.75  Sodium 134 - 144 mmol/L 142 141 140  Potassium 3.5 - 5.2 mmol/L 4.2 4.0 3.6  Chloride 96 - 106 mmol/L 104 104 102  CO2 20 - 29 mmol/L '24 21 22  ' Calcium 8.7 - 10.3 mg/dL 9.5 9.4 9.0  Total Protein 6.0 - 8.5 g/dL - 7.7 -  Total Bilirubin 0.0 - 1.2 mg/dL - 0.7 -  Alkaline Phos 39 - 117 IU/L - 83 -  AST 0 - 40 IU/L - 17 -  ALT 0 - 32 IU/L - 11 -    Lipid Panel     Component Value Date/Time   CHOL 205 (H) 05/14/2019 1131   TRIG 64 05/14/2019 1131   HDL 68 05/14/2019 1131   CHOLHDL 3.0 05/14/2019 1131   CHOLHDL 3.4 05/20/2015 0917   VLDL 13 05/20/2015 0917   LDLCALC 125 (H) 05/14/2019 1131    CBC    Component Value Date/Time   WBC 5.5 01/04/2017 1501   WBC 6.7 03/12/2016 0834   RBC 4.57 01/04/2017 1501   RBC 4.66 03/12/2016 0834   HGB 12.0 01/04/2017 1501   HCT 38.1 01/04/2017 1501   PLT 194 01/04/2017 1501   MCV 83 01/04/2017 1501   MCH 26.3 (L) 01/04/2017 1501   MCH 26.2 03/12/2016 0834   MCHC 31.5 01/04/2017 1501   MCHC 31.0 03/12/2016 0834   RDW 14.2 01/04/2017 1501   LYMPHSABS 2.1  01/04/2017 1501   MONOABS 0.5 07/29/2013 1806   EOSABS 0.3 01/04/2017 1501   BASOSABS 0.1 01/04/2017 1501    Lab Results  Component Value Date  HGBA1C 4.8 01/04/2017    Assessment & Plan:  1. Essential hypertension Uncontrolled due to the fact that she is yet to take her antihypertensive Compliance has been emphasized Counseled on blood pressure goal of less than 130/80, low-sodium, DASH diet, medication compliance, 150 minutes of moderate intensity exercise per week. Discussed medication compliance, adverse effects. - CMP14+EGFR; Future - Lipid panel; Future - amLODipine (NORVASC) 10 MG tablet; TAKE 1 TABLET (10 MG TOTAL) BY MOUTH DAILY.  Dispense: 90 tablet; Refill: 1 - carvedilol (COREG) 3.125 MG tablet; Take 1 tablet (3.125 mg total) by mouth 2 (two) times daily with a meal.  Dispense: 180 tablet; Refill: 1  2. Primary osteoarthritis of other site Stable - meloxicam (MOBIC) 7.5 MG tablet; Take 1 tablet (7.5 mg total) by mouth daily.  Dispense: 90 tablet; Refill: 1  3. Encounter for screening mammogram for malignant neoplasm of breast - MM 3D SCREEN BREAST BILATERAL; Future  4. Estrogen deficiency - DG Bone Density; Future  5. Bipolar disorder, in full remission, most recent episode depressed ([place]) Controlled on Prozac 60 mg  6. Gait abnormality Will need to exclude intracranial abnormality given chronic duration - CT HEAD WO CONTRAST (5MM); Future  7. Dizziness Refill Zyrtec  8.  Morbid obesity She has lost 13 pounds in the last 8 months and has been commended Continue to work on caloric restriction and increasing physical activity.  Meds ordered this encounter  Medications   DISCONTD: carvedilol (COREG) 3.125 MG tablet    Sig: Take 1 tablet (3.125 mg total) by mouth 2 (two) times daily with a meal.    Dispense:  180 tablet    Refill:  1   DISCONTD: FLUoxetine (PROZAC) 20 MG capsule    Sig: Take 3 capsules (60 mg total) by mouth daily.    Dispense:  270  capsule    Refill:  1   DISCONTD: meloxicam (MOBIC) 7.5 MG tablet    Sig: Take 1 tablet (7.5 mg total) by mouth daily.    Dispense:  90 tablet    Refill:  1   DISCONTD: amLODipine (NORVASC) 10 MG tablet    Sig: TAKE 1 TABLET (10 MG TOTAL) BY MOUTH DAILY.    Dispense:  90 tablet    Refill:  1   amLODipine (NORVASC) 10 MG tablet    Sig: TAKE 1 TABLET (10 MG TOTAL) BY MOUTH DAILY.    Dispense:  90 tablet    Refill:  1   carvedilol (COREG) 3.125 MG tablet    Sig: Take 1 tablet (3.125 mg total) by mouth 2 (two) times daily with a meal.    Dispense:  180 tablet    Refill:  1   FLUoxetine (PROZAC) 20 MG capsule    Sig: Take 3 capsules (60 mg total) by mouth daily.    Dispense:  270 capsule    Refill:  1   meloxicam (MOBIC) 7.5 MG tablet    Sig: Take 1 tablet (7.5 mg total) by mouth daily.    Dispense:  90 tablet    Refill:  1    Follow-up: Return in about 6 months (around 06/30/2021) for medical condiitons.       Charlott Rakes, MD, FAAFP. [facility] and [facility] [place], [place]   12/28/2020, 5:55 PM

## 2020-12-29 ENCOUNTER — Ambulatory Visit: Payer: Medicare HMO | Attending: Family Medicine

## 2020-12-29 ENCOUNTER — Telehealth: Payer: Self-pay | Admitting: Family Medicine

## 2020-12-29 DIAGNOSIS — I10 Essential (primary) hypertension: Secondary | ICD-10-CM

## 2020-12-29 NOTE — Telephone Encounter (Signed)
Can you please schedule head CT for this patient?  Thank you

## 2020-12-30 ENCOUNTER — Other Ambulatory Visit: Payer: Self-pay | Admitting: Family Medicine

## 2020-12-30 ENCOUNTER — Other Ambulatory Visit: Payer: Self-pay

## 2020-12-30 DIAGNOSIS — E7800 Pure hypercholesterolemia, unspecified: Secondary | ICD-10-CM

## 2020-12-30 LAB — CMP14+EGFR
ALT: 13 IU/L (ref 0–32)
AST: 17 IU/L (ref 0–40)
Albumin/Globulin Ratio: 1.5 (ref 1.2–2.2)
Albumin: 4.1 g/dL (ref 3.8–4.8)
Alkaline Phosphatase: 69 IU/L (ref 44–121)
BUN/Creatinine Ratio: 24 (ref 12–28)
BUN: 17 mg/dL (ref 8–27)
Bilirubin Total: 0.6 mg/dL (ref 0.0–1.2)
CO2: 23 mmol/L (ref 20–29)
Calcium: 9 mg/dL (ref 8.7–10.3)
Chloride: 109 mmol/L — ABNORMAL HIGH (ref 96–106)
Creatinine, Ser: 0.71 mg/dL (ref 0.57–1.00)
Globulin, Total: 2.7 g/dL (ref 1.5–4.5)
Glucose: 89 mg/dL (ref 65–99)
Potassium: 3.6 mmol/L (ref 3.5–5.2)
Sodium: 143 mmol/L (ref 134–144)
Total Protein: 6.8 g/dL (ref 6.0–8.5)
eGFR: 93 mL/min/{1.73_m2} (ref >59)

## 2020-12-30 LAB — LIPID PANEL
Chol/HDL Ratio: 3.1 ratio (ref 0.0–4.4)
Cholesterol, Total: 206 mg/dL — ABNORMAL HIGH (ref 100–199)
HDL: 67 mg/dL (ref >39)
LDL Chol Calc (NIH): 125 mg/dL — ABNORMAL HIGH (ref 0–99)
Triglycerides: 76 mg/dL (ref 0–149)
VLDL Cholesterol Cal: 14 mg/dL (ref 5–40)

## 2020-12-30 MED ORDER — ATORVASTATIN CALCIUM 40 MG PO TABS
40.0000 mg | ORAL_TABLET | Freq: Every day | ORAL | 1 refills | Status: DC
  Filled 2020-12-30 – 2021-01-12 (×2): qty 90, 90d supply, fill #0

## 2021-01-01 ENCOUNTER — Telehealth: Payer: Self-pay

## 2021-01-01 NOTE — Telephone Encounter (Signed)
Patient name and DOB has been verified Patient was informed of lab results. Patient had no questions.  

## 2021-01-01 NOTE — Telephone Encounter (Signed)
-----   Message from Hoy Register, MD sent at 12/30/2020  1:51 PM EDT ----- Please inform her that cholesterol is still slightly elevated.  I have sent an increased dose of atorvastatin 40 mg pharmacy.  Working on a low-cholesterol diet, exercise will be beneficial.  Other labs are stable

## 2021-01-01 NOTE — Telephone Encounter (Signed)
Ct has been scheduled and pt has been informed.

## 2021-01-02 ENCOUNTER — Ambulatory Visit
Admission: RE | Admit: 2021-01-02 | Discharge: 2021-01-02 | Disposition: A | Discharge status: Home / Self Care | Payer: Medicare HMO | Source: Ambulatory Visit | Attending: Family Medicine | Admitting: Family Medicine

## 2021-01-02 DIAGNOSIS — Z1231 Encounter for screening mammogram for malignant neoplasm of breast: Secondary | ICD-10-CM | POA: Diagnosis not present

## 2021-01-03 ENCOUNTER — Telehealth: Payer: Self-pay

## 2021-01-03 NOTE — Telephone Encounter (Signed)
Called pt to schedule AWV. No answer&Voicemail full

## 2021-01-06 ENCOUNTER — Other Ambulatory Visit: Payer: Self-pay

## 2021-01-07 ENCOUNTER — Other Ambulatory Visit: Payer: Self-pay

## 2021-01-07 ENCOUNTER — Ambulatory Visit (HOSPITAL_COMMUNITY)
Admission: RE | Admit: 2021-01-07 | Discharge: 2021-01-07 | Disposition: A | Discharge status: Home / Self Care | Payer: Medicare HMO | Source: Ambulatory Visit | Attending: Family Medicine | Admitting: Family Medicine

## 2021-01-07 DIAGNOSIS — R270 Ataxia, unspecified: Secondary | ICD-10-CM | POA: Diagnosis not present

## 2021-01-07 DIAGNOSIS — R269 Unspecified abnormalities of gait and mobility: Secondary | ICD-10-CM | POA: Diagnosis not present

## 2021-01-12 ENCOUNTER — Other Ambulatory Visit: Payer: Self-pay

## 2021-01-15 ENCOUNTER — Telehealth: Payer: Self-pay

## 2021-01-15 NOTE — Telephone Encounter (Signed)
-----   Message from Hoy Register, MD sent at 01/09/2021 10:00 AM EDT ----- CT head is normal

## 2021-01-15 NOTE — Telephone Encounter (Signed)
Patient was called and a voicemail was left informing patient to return phone call for lab results.   A CRM was created to give results when patient returns call.

## 2021-01-18 ENCOUNTER — Telehealth: Payer: Self-pay

## 2021-01-18 NOTE — Telephone Encounter (Signed)
Called pt schedule to schedule her annual wellness visit, pt schedule for 9-24 at 11am

## 2021-01-30 ENCOUNTER — Ambulatory Visit (HOSPITAL_BASED_OUTPATIENT_CLINIC_OR_DEPARTMENT_OTHER): Payer: Medicare HMO

## 2021-01-30 DIAGNOSIS — Z0000 Encounter for general adult medical examination without abnormal findings: Secondary | ICD-10-CM | POA: Diagnosis not present

## 2021-01-30 NOTE — Patient Instructions (Signed)
Health Maintenance, Female Adopting a healthy lifestyle and getting preventive care are important in promoting health and wellness. Ask your health care provider about: The right schedule for you to have regular tests and exams. Things you can do on your own to prevent diseases and keep yourself healthy. What should I know about diet, weight, and exercise? Eat a healthy diet  Eat a diet that includes plenty of vegetables, fruits, low-fat dairy products, and lean protein. Do not eat a lot of foods that are high in solid fats, added sugars, or sodium. Maintain a healthy weight Body mass index (BMI) is used to identify weight problems. It estimates body fat based on height and weight. Your health care provider can help determine your BMI and help you achieve or maintain a healthy weight. Get regular exercise Get regular exercise. This is one of the most important things you can do for your health. Most adults should: Exercise for at least 150 minutes each week. The exercise should increase your heart rate and make you sweat (moderate-intensity exercise). Do strengthening exercises at least twice a week. This is in addition to the moderate-intensity exercise. Spend less time sitting. Even light physical activity can be beneficial. Watch cholesterol and blood lipids Have your blood tested for lipids and cholesterol at 68 years of age, then have this test every 5 years. Have your cholesterol levels checked more often if: Your lipid or cholesterol levels are high. You are older than 68 years of age. You are at high risk for heart disease. What should I know about cancer screening? Depending on your health history and family history, you may need to have cancer screening at various ages. This may include screening for: Breast cancer. Cervical cancer. Colorectal cancer. Skin cancer. Lung cancer. What should I know about heart disease, diabetes, and high blood pressure? Blood pressure and heart  disease High blood pressure causes heart disease and increases the risk of stroke. This is more likely to develop in people who have high blood pressure readings, are of African descent, or are overweight. Have your blood pressure checked: Every 3-5 years if you are 18-39 years of age. Every year if you are 40 years old or older. Diabetes Have regular diabetes screenings. This checks your fasting blood sugar level. Have the screening done: Once every three years after age 40 if you are at a normal weight and have a low risk for diabetes. More often and at a younger age if you are overweight or have a high risk for diabetes. What should I know about preventing infection? Hepatitis B If you have a higher risk for hepatitis B, you should be screened for this virus. Talk with your health care provider to find out if you are at risk for hepatitis B infection. Hepatitis C Testing is recommended for: Everyone born from 1945 through 1965. Anyone with known risk factors for hepatitis C. Sexually transmitted infections (STIs) Get screened for STIs, including gonorrhea and chlamydia, if: You are sexually active and are younger than 68 years of age. You are older than 68 years of age and your health care provider tells you that you are at risk for this type of infection. Your sexual activity has changed since you were last screened, and you are at increased risk for chlamydia or gonorrhea. Ask your health care provider if you are at risk. Ask your health care provider about whether you are at high risk for HIV. Your health care provider may recommend a prescription medicine   to help prevent HIV infection. If you choose to take medicine to prevent HIV, you should first get tested for HIV. You should then be tested every 3 months for as long as you are taking the medicine. Pregnancy If you are about to stop having your period (premenopausal) and you may become pregnant, seek counseling before you get  pregnant. Take 400 to 800 micrograms (mcg) of folic acid every day if you become pregnant. Ask for birth control (contraception) if you want to prevent pregnancy. Osteoporosis and menopause Osteoporosis is a disease in which the bones lose minerals and strength with aging. This can result in bone fractures. If you are 65 years old or older, or if you are at risk for osteoporosis and fractures, ask your health care provider if you should: Be screened for bone loss. Take a calcium or vitamin D supplement to lower your risk of fractures. Be given hormone replacement therapy (HRT) to treat symptoms of menopause. Follow these instructions at home: Lifestyle Do not use any products that contain nicotine or tobacco, such as cigarettes, e-cigarettes, and chewing tobacco. If you need help quitting, ask your health care provider. Do not use street drugs. Do not share needles. Ask your health care provider for help if you need support or information about quitting drugs. Alcohol use Do not drink alcohol if: Your health care provider tells you not to drink. You are pregnant, may be pregnant, or are planning to become pregnant. If you drink alcohol: Limit how much you use to 0-1 drink a day. Limit intake if you are breastfeeding. Be aware of how much alcohol is in your drink. In the [place] one drink equals one 12 oz bottle of beer (355 mL), one 5 oz glass of wine (148 mL), or one 1 oz glass of hard liquor (44 mL). General instructions Schedule regular health, dental, and eye exams. Stay current with your vaccines. Tell your health care provider if: You often feel depressed. You have ever been abused or do not feel safe at home. Summary Adopting a healthy lifestyle and getting preventive care are important in promoting health and wellness. Follow your health care provider's instructions about healthy diet, exercising, and getting tested or screened for diseases. Follow your health care provider's  instructions on monitoring your cholesterol and blood pressure. This information is not intended to replace advice given to you by your health care provider. Make sure you discuss any questions you have with your health care provider. Document Revised: 07/03/2020 Document Reviewed: 04/18/2018 Elsevier Patient Education  2022 [employer]  

## 2021-01-30 NOTE — Progress Notes (Signed)
Subjective:   Stephanie Juarez is a 70 y.o. female who presents for an Initial Medicare Annual Wellness Visit.I connected with  Stephanie Juarez on 01/30/21 by a audio enabled telemedicine application and verified that I am speaking with the correct person using two identifiers.   I discussed the limitations of evaluation and management by telemedicine. The patient expressed understanding and agreed to proceed. Location of patient: Home Location of Provider:Home  Persons participating in visit: Stephanie Juarez(patient), Stephanie Juarez cma  Review of Systems    Defer to PCP       Objective:    There were no vitals filed for this visit. There is no height or weight on file to calculate BMI.  Advanced Directives 03/29/2018 01/04/2017 08/04/2016 03/15/2016 03/12/2016 08/05/2015 07/22/2015  Does Patient Have a Medical Advance Directive? No No No No No No No  Would patient like information on creating a medical advance directive? No - Patient declined - No - Patient declined - - No - patient declined information No - patient declined information    Current Medications (verified) Outpatient Encounter Medications as of 01/30/2021  Medication Sig   amLODipine (NORVASC) 10 MG tablet TAKE 1 TABLET (10 MG TOTAL) BY MOUTH DAILY.   atorvastatin (LIPITOR) 40 MG tablet Take 1 tablet (40 mg total) by mouth daily.   buPROPion (WELLBUTRIN) 100 MG tablet Take 100 mg by mouth daily.   carvedilol (COREG) 3.125 MG tablet Take 1 tablet (3.125 mg total) by mouth 2 (two) times daily with a meal.   cetirizine (ZYRTEC) 10 MG tablet Take 1 tablet (10 mg total) by mouth daily.   FLUoxetine (PROZAC) 20 MG capsule Take 3 capsules (60 mg total) by mouth daily.   HYDROcodone-acetaminophen (NORCO/VICODIN) 5-325 MG tablet Take 2 tablets by mouth every 4 (four) hours as needed. (Patient not taking: No sig reported)   meloxicam (MOBIC) 7.5 MG tablet Take 1 tablet (7.5 mg total) by mouth daily.   ondansetron (ZOFRAN ODT) 4  MG disintegrating tablet Take 1 tablet (4 mg total) by mouth every 8 (eight) hours as needed for nausea or vomiting. (Patient not taking: No sig reported)   tetrahydrozoline 0.05 % ophthalmic solution Place 1 drop into both eyes daily as needed (dry eyes). (Patient not taking: No sig reported)   [DISCONTINUED] loratadine (CLARITIN) 10 MG tablet Take 1 tablet (10 mg total) by mouth daily. (Patient not taking: Reported on 06/26/2014)   No facility-administered encounter medications on file as of 01/30/2021.    Allergies (verified) Tramadol and Vicks nyquil cold & flu night [dm-apap-cpm]   History: Past Medical History:  Diagnosis Date   Arthritis    Hypertension    Past Surgical History:  Procedure Laterality Date   MANDIBLE SURGERY Left 1981   impacted teeth   SHOULDER ARTHROSCOPY Left 2001   for cyst   Family History  Problem Relation Age of Onset   Arthritis Mother    Heart disease Mother    Heart disease Father    Social History   Socioeconomic History   Marital status: Single    Spouse name: Not on file   Number of children: Not on file   Years of education: Not on file   Highest education level: Not on file  Occupational History   Not on file  Tobacco Use   Smoking status: Former   Smokeless tobacco: Never  Substance and Sexual Activity   Alcohol use: No   Drug use: No   Sexual activity: Not  on file  Other Topics Concern   Not on file  Social History Narrative   Not on file   Social Determinants of Health   Financial Resource Strain: Not on file  Food Insecurity: Not on file  Transportation Needs: Not on file  Physical Activity: Not on file  Stress: Not on file  Social Connections: Not on file    Tobacco Counseling Counseling given: Not Answered   Clinical Intake:                 Diabetic?No         Activities of Daily Living No flowsheet data found.  Patient Care Team: Hoy Register, MD as PCP - General (Family  Medicine)  Indicate any recent Medical Services you may have received from other than [facility] providers in the past year (date may be approximate).     Assessment:   This is a routine wellness examination for [facility].  Hearing/Vision screen No results found.  Dietary issues and exercise activities discussed:     Goals Addressed   None    Depression Screen PHQ 2/9 Scores 12/28/2020 08/28/2019 05/14/2019 04/26/2018 03/29/2018 09/27/2017 08/04/2016  PHQ - 2 Score 0 2 2 1 2 2 2   PHQ- 9 Score 10 9 11 8 10 10 12   Exception Documentation - - - - - - -    Fall Risk Fall Risk  12/28/2020 03/23/2020 08/28/2019 05/14/2019 04/26/2018  Falls in the past year? 0 0 0 0 0  Number falls in past yr: 0 - - - -  Injury with Fall? 0 - - - -  Risk for fall due to : - - - - -    FALL RISK PREVENTION PERTAINING TO THE HOME:  Any stairs in or around the home? No  If so, are there any without handrails? No  Home free of loose throw rugs in walkways, pet beds, electrical cords, etc? Yes  Adequate lighting in your home to reduce risk of falls? Yes   ASSISTIVE DEVICES UTILIZED TO PREVENT FALLS:  Life alert? No  Use of a cane, walker or w/c? No  Grab bars in the bathroom? No  Shower chair or bench in shower? No  Elevated toilet seat or a handicapped toilet? No   TIMED UP AND GO:  Was the test performed? No .  Length of time to ambulate 10 feet: n/a sec.     Cognitive Function:        Immunizations Immunization History  Administered Date(s) Administered   Influenza,inj,Quad PF,6+ Mos 06/26/2014, 02/05/2015, 03/29/2018   Janssen (J&J) SARS-COV-2 Vaccination 07/22/2019   Pneumococcal Conjugate-13 05/14/2019   Tdap 06/26/2014    TDAP status: Up to date  Pneumococcal vaccine status: Due, Education has been provided regarding the importance of this vaccine. Advised may receive this vaccine at local pharmacy or Health Dept. Aware to provide a copy of the vaccination record if obtained from local  pharmacy or Health Dept. Verbalized acceptance and understanding.  Pneumococcal vaccine status: Due, Education has been provided regarding the importance of this vaccine. Advised may receive this vaccine at local pharmacy or Health Dept. Aware to provide a copy of the vaccination record if obtained from local pharmacy or Health Dept. Verbalized acceptance and understanding.  Covid-19 vaccine status: Information provided on how to obtain vaccines.   Qualifies for Shingles Vaccine? Yes   Zostavax completed No   Shingrix Completed?: No.    Education has been provided regarding the importance of this vaccine. Patient has  been advised to call insurance company to determine out of pocket expense if they have not yet received this vaccine. Advised may also receive vaccine at local pharmacy or Health Dept. Verbalized acceptance and understanding.  Screening Tests Health Maintenance  Topic Date Due   Zoster Vaccines- Shingrix (1 of 2) Never done   DEXA SCAN  Never done   COVID-19 Vaccine (2 - Booster for Janssen series) 09/16/2019   INFLUENZA VACCINE  12/07/2020   MAMMOGRAM  01/03/2023   COLONOSCOPY (Pts 45-57yrs Insurance coverage will need to be confirmed)  06/26/2024   TETANUS/TDAP  06/26/2024   Hepatitis C Screening  Completed   HPV VACCINES  Aged Out    Health Maintenance  Health Maintenance Due  Topic Date Due   Zoster Vaccines- Shingrix (1 of 2) Never done   DEXA SCAN  Never done   COVID-19 Vaccine (2 - Booster for Janssen series) 09/16/2019   INFLUENZA VACCINE  12/07/2020    Colorectal cancer screening: Type of screening: Colonoscopy. Completed 06/26/2014. Repeat every 10 years  Mammogram status: Completed 01/03/23 due. Repeat every year  Bone Density status: Completed 06/22/21 scheduled. Results reflect: Bone density results: NORMAL. Repeat every n/a years.  Lung Cancer Screening: (Low Dose CT Chest recommended if Age 59-80 years, 30 pack-year currently smoking OR have quit w/in  15years.) does not qualify.   Lung Cancer Screening Referral: n/a  Additional Screening:  Hepatitis C Screening: does qualify; Completed 09/27/2017  Vision Screening: Recommended annual ophthalmology exams for early detection of glaucoma and other disorders of the eye. Is the patient up to date with their annual eye exam?  Yes  Who is the provider or what is the name of the office in which the patient attends annual eye exams? [facility] If pt is not established with a provider, would they like to be referred to a provider to establish care? No .   Dental Screening: Recommended annual dental exams for proper oral hygiene  Community Resource Referral / Chronic Care Management: CRR required this visit?  No   CCM required this visit?  No      Plan:     I have personally reviewed and noted the following in the patient's chart:   Medical and social history Use of alcohol, tobacco or illicit drugs  Current medications and supplements including opioid prescriptions. Patient is not currently taking opioid prescriptions. Functional ability and status Nutritional status Physical activity Advanced directives List of other physicians Hospitalizations, surgeries, and ER visits in previous 12 months Vitals Screenings to include cognitive, depression, and falls Referrals and appointments  In addition, I have reviewed and discussed with patient certain preventive protocols, quality metrics, and best practice recommendations. A written personalized care plan for preventive services as well as general preventive health recommendations were provided to patient.     Stephanie Juarez, [facility]   01/30/2021   Nurse Notes: 60 minute non face to face visit. Stephanie Juarez , Thank you for taking time to come for your Medicare Wellness Visit. I appreciate your ongoing commitment to your health goals. Please review the following plan we discussed and let me know if I can assist you in the future.    These are the goals we discussed:  Goals   None     This is a list of the screening recommended for you and due dates:  Health Maintenance  Topic Date Due   Zoster (Shingles) Vaccine (1 of 2) Never done   DEXA scan (bone density  measurement)  Never done   COVID-19 Vaccine (2 - Booster for Janssen series) 09/16/2019   Flu Shot  12/07/2020   Mammogram  01/03/2023   Colon Cancer Screening  06/26/2024   Tetanus Vaccine  06/26/2024   Hepatitis C Screening: USPSTF Recommendation to screen - Ages 18-79 yo.  Completed   HPV Vaccine  Aged Out

## 2021-04-08 ENCOUNTER — Ambulatory Visit: Payer: Self-pay | Admitting: *Deleted

## 2021-04-08 NOTE — Telephone Encounter (Signed)
Per agent: "Pt stated part of her hearing aid is stuck in her ear and it is very painful. Pt requests call back asap. Cb# [phone]"   Pt reports earache left ear,onset Tuesday. Rates at 8/10. States she thought  filter from her hearing aid was stuck in ear, had several family members check, did not see filter. Reports lymph nodes of neck tender on that  side  under ear, headache "On that side only." States has been putting objects in ear in attempts to get something out. No drainage. No availability at practice. Advised UC. Pt states will follow disposition.  Reason for Disposition  Sudden onset of ear pain after long - thin object was inserted into the ear canal (e.g., pencil, Q-tip)  Answer Assessment - Initial Assessment Questions 1. LOCATION: "Which ear is involved?"     left 2. ONSET: "When did the ear start hurting"      Tuesday 3. SEVERITY: "How bad is the pain?"  (Scale 1-10; mild, moderate or severe)   - MILD (1-3): doesn't interfere with normal activities    - MODERATE (4-7): interferes with normal activities or awakens from sleep    - SEVERE (8-10): excruciating pain, unable to do any normal activities      8/10 4. URI SYMPTOMS: "Do you have a runny nose or cough?"     None 5. FEVER: "Do you have a fever?" If Yes, ask: "What is your temperature, how was it measured, and when did it start?"     No 6. CAUSE: "Have you been swimming recently?", "How often do you use Q-TIPS?", "Have you had any recent air travel or scuba diving?"     no 7. OTHER SYMPTOMS: "Do you have any other symptoms?" (e.g., headache, stiff neck, dizziness, vomiting, runny nose, decreased hearing)     Neck tender on that side, under ear. Headache left side of head.  Think maybe hearing aid filter in ear?  Protocols used: Davina Poke

## 2021-04-10 ENCOUNTER — Other Ambulatory Visit: Payer: Self-pay

## 2021-04-10 ENCOUNTER — Encounter (HOSPITAL_COMMUNITY): Payer: Self-pay

## 2021-04-10 ENCOUNTER — Ambulatory Visit (HOSPITAL_COMMUNITY)
Admission: EM | Admit: 2021-04-10 | Discharge: 2021-04-10 | Disposition: A | Discharge status: Home / Self Care | Payer: Medicare HMO | Attending: Student | Admitting: Student

## 2021-04-10 DIAGNOSIS — H9202 Otalgia, left ear: Secondary | ICD-10-CM | POA: Diagnosis not present

## 2021-04-10 DIAGNOSIS — T162XXA Foreign body in left ear, initial encounter: Secondary | ICD-10-CM | POA: Diagnosis not present

## 2021-04-10 NOTE — Discharge Instructions (Addendum)
-  Please follow-up with your ENT doctor for further management. It's possible they'll need to use a small tool to grasp the object and pull it out of your ear.

## 2021-04-10 NOTE — ED Provider Notes (Signed)
[facility]    CSN: [id number] Arrival date & time: 04/10/21  1624      History   Chief Complaint Chief Complaint  Patient presents with   Foreign Body in Ear    HPI Stephanie Juarez is a 70 y.o. female presenting with hearing aid stuck in L ear x1 week. States she initially thought that this had come out but she has developed ear pain with headaches and muffled hearing. Relative is a nurse and attempted to look in ear with a camera and saw something in it.  HPI  Past Medical History:  Diagnosis Date   Arthritis    Hypertension     Patient Active Problem List   Diagnosis Date Noted   Obesity 03/29/2018   Bipolar disorder in full remission (HCC) 01/04/2017   Hypokalemia 03/16/2016   Cholelithiasis 03/15/2016   Hyperlipidemia 03/15/2016   Knee pain 11/18/2015   Complete tear of right rotator cuff 08/05/2015   Adhesive capsulitis of right shoulder 03/11/2015   Essential hypertension 06/26/2014   Memory loss 06/26/2014    Past Surgical History:  Procedure Laterality Date   MANDIBLE SURGERY Left 1981   impacted teeth   SHOULDER ARTHROSCOPY Left 2001   for cyst    OB History   No obstetric history on file.      Home Medications    Prior to Admission medications   Medication Sig Start Date End Date Taking? Authorizing Provider  amLODipine (NORVASC) 10 MG tablet TAKE 1 TABLET (10 MG TOTAL) BY MOUTH DAILY. 12/28/20 12/28/21  Hoy Register, MD  atorvastatin (LIPITOR) 40 MG tablet Take 1 tablet (40 mg total) by mouth daily. 12/30/20   Hoy Register, MD  buPROPion (WELLBUTRIN) 100 MG tablet Take 100 mg by mouth daily.    [provider]  carvedilol (COREG) 3.125 MG tablet Take 1 tablet (3.125 mg total) by mouth 2 (two) times daily with a meal. 12/28/20   Hoy Register, MD  cetirizine (ZYRTEC) 10 MG tablet Take 1 tablet (10 mg total) by mouth daily. 03/23/20   Hoy Register, MD  FLUoxetine (PROZAC) 20 MG capsule Take 3 capsules (60 mg total)  by mouth daily. 12/28/20   Hoy Register, MD  HYDROcodone-acetaminophen (NORCO/VICODIN) 5-325 MG tablet Take 2 tablets by mouth every 4 (four) hours as needed. Patient not taking: No sig reported 03/12/16   Francoise Ceo, DO  meloxicam (MOBIC) 7.5 MG tablet Take 1 tablet (7.5 mg total) by mouth daily. 12/28/20   Hoy Register, MD  ondansetron (ZOFRAN ODT) 4 MG disintegrating tablet Take 1 tablet (4 mg total) by mouth every 8 (eight) hours as needed for nausea or vomiting. Patient not taking: No sig reported 03/12/16   Francoise Ceo, DO  tetrahydrozoline 0.05 % ophthalmic solution Place 1 drop into both eyes daily as needed (dry eyes). Patient not taking: No sig reported    [provider]  loratadine (CLARITIN) 10 MG tablet Take 1 tablet (10 mg total) by mouth daily. Patient not taking: Reported on 06/26/2014 09/09/13 08/05/14  Ambrose Finland, NP    Family History Family History  Problem Relation Age of Onset   Arthritis Mother    Heart disease Mother    Heart disease Father     Social History Social History   Tobacco Use   Smoking status: Former   Smokeless tobacco: Never  Substance Use Topics   Alcohol use: No   Drug use: No     Allergies   Tramadol  and Vicks nyquil cold & flu night [dm-apap-cpm]   Review of Systems Review of Systems  HENT:  Positive for ear pain.   All other systems reviewed and are negative.   Physical Exam Triage Vital Signs ED Triage Vitals  Enc Vitals Group     BP 04/10/21 1742 (!) 144/60     Pulse Rate 04/10/21 1742 69     Resp 04/10/21 1742 18     Temp 04/10/21 1742 97.9 F (36.6 C)     Temp Source 04/10/21 1742 Oral     SpO2 04/10/21 1742 97 %     Weight --      Height --      Head Circumference --      Peak Flow --      Pain Score 04/10/21 1740 4     Pain Loc --      Pain Edu? --      Excl. in GC? --    No data found.  Updated Vital Signs BP (!) 144/60 (BP Location: Right Arm)   Pulse 69   Temp 97.9 F (36.6 C)  (Oral)   Resp 18   SpO2 97%   Visual Acuity Right Eye Distance:   Left Eye Distance:   Bilateral Distance:    Right Eye Near:   Left Eye Near:    Bilateral Near:     Physical Exam Vitals reviewed.  Constitutional:      Appearance: Normal appearance. She is not ill-appearing.  HENT:     Head: Normocephalic and atraumatic.     Right Ear: Hearing, tympanic membrane, ear canal and external ear normal. No swelling or tenderness. No middle ear effusion. There is no impacted cerumen. No mastoid tenderness. Tympanic membrane is not injected, scarred, perforated, erythematous, retracted or bulging.     Left Ear: Hearing, tympanic membrane, ear canal and external ear normal. No swelling or tenderness.  No middle ear effusion. There is no impacted cerumen. No mastoid tenderness. Tympanic membrane is not injected, scarred, perforated, erythematous, retracted or bulging.     Ears:     Comments: L ear - small 44mm metallic object visualized proximal to TM. Difficult to visualize. Patient did not tolerate lavage, and no foreign body was removed.    Mouth/Throat:     Pharynx: Oropharynx is clear. No oropharyngeal exudate or posterior oropharyngeal erythema.  Cardiovascular:     Rate and Rhythm: Normal rate and regular rhythm.     Heart sounds: Normal heart sounds.  Pulmonary:     Effort: Pulmonary effort is normal.     Breath sounds: Normal breath sounds.  Lymphadenopathy:     Cervical: No cervical adenopathy.  Neurological:     General: No focal deficit present.     Mental Status: She is alert and oriented to person, place, and time.  Psychiatric:        Mood and Affect: Mood normal.        Behavior: Behavior normal.        Thought Content: Thought content normal.        Judgment: Judgment normal.     UC Treatments / Results  Labs (all labs ordered are listed, but only abnormal results are displayed) Labs Reviewed - No data to display  EKG   Radiology No results  found.  Procedures Procedures (including critical care time)  Medications Ordered in UC Medications - No data to display  Initial Impression / Assessment and Plan / UC Course  I have reviewed  the triage vital signs and the nursing notes.  Pertinent labs & imaging results that were available during my care of the patient were reviewed by me and considered in my medical decision making (see chart for details).     This patient is a very pleasant 70 y.o. year old female presenting with possible foreign body L ear ( piece of hearing aid). Possible foreign body was visualized though this appeared to be very deep. Lavage was performed by nurse unsuccessfully. She will f/u with  her ENT provider for further workup..   Final Clinical Impressions(s) / UC Diagnoses   Final diagnoses:  None   Discharge Instructions   None    ED Prescriptions   None    PDMP not reviewed this encounter.   Rhys Martini, PA-C 04/10/21 [phone]

## 2021-04-10 NOTE — ED Triage Notes (Signed)
Pt presents with the tip of a hearing aid in her L ear. Pt states it got stuck Monday last week.

## 2021-04-12 DIAGNOSIS — H93293 Other abnormal auditory perceptions, bilateral: Secondary | ICD-10-CM | POA: Diagnosis not present

## 2021-04-12 DIAGNOSIS — Z974 Presence of external hearing-aid: Secondary | ICD-10-CM | POA: Diagnosis not present

## 2021-04-12 DIAGNOSIS — T162XXA Foreign body in left ear, initial encounter: Secondary | ICD-10-CM | POA: Diagnosis not present

## 2021-04-14 ENCOUNTER — Other Ambulatory Visit: Payer: Self-pay | Admitting: Family Medicine

## 2021-04-14 DIAGNOSIS — E7800 Pure hypercholesterolemia, unspecified: Secondary | ICD-10-CM

## 2021-04-14 NOTE — Telephone Encounter (Signed)
Requested medications are due for refill today.  no  Requested medications are on the active medications list.  no  Last refill. 10/26/2020  Future visit scheduled.   yes  Notes to clinic.  Dose was increased. Pt no longer taking this dose.    Requested Prescriptions  Pending Prescriptions Disp Refills   atorvastatin (LIPITOR) 20 MG tablet [Pharmacy Med Name: ATORVASTATIN CALCIUM 20 MG Tablet] 90 tablet 1    Sig: Take 1 tablet (20 mg total) by mouth daily.     Cardiovascular:  Antilipid - Statins Failed - 04/14/2021  3:29 PM      Failed - Total Cholesterol in normal range and within 360 days    Cholesterol, Total  Date Value Ref Range Status  12/29/2020 206 (H) 100 - 199 mg/dL Final          Failed - LDL in normal range and within 360 days    LDL Chol Calc (NIH)  Date Value Ref Range Status  12/29/2020 125 (H) 0 - 99 mg/dL Final          Passed - HDL in normal range and within 360 days    HDL  Date Value Ref Range Status  12/29/2020 67 >39 mg/dL Final          Passed - Triglycerides in normal range and within 360 days    Triglycerides  Date Value Ref Range Status  12/29/2020 76 0 - 149 mg/dL Final          Passed - Patient is not pregnant      Passed - Valid encounter within last 12 months    Recent Outpatient Visits           3 months ago Encounter for screening mammogram for malignant neoplasm of breast   [facility] [place], [place], MD   1 year ago Adjustment disorder with mixed anxiety and depressed mood   [facility] [place], Odette Horns, MD   1 year ago Dizziness   [facility] [place], Odette Horns, MD   1 year ago Adjustment disorder with mixed anxiety and depressed mood   [facility] [place], [place] D, LCSW   1 year ago Estrogen deficiency   [facility] Hoy Register, MD       Future Appointments              In 2 months Hoy Register, MD [facility]

## 2021-05-07 ENCOUNTER — Other Ambulatory Visit: Payer: Self-pay

## 2021-05-07 ENCOUNTER — Other Ambulatory Visit: Payer: Self-pay | Admitting: Family Medicine

## 2021-05-07 ENCOUNTER — Telehealth: Payer: Self-pay | Admitting: Family Medicine

## 2021-05-07 DIAGNOSIS — E7800 Pure hypercholesterolemia, unspecified: Secondary | ICD-10-CM

## 2021-05-07 DIAGNOSIS — M1909 Primary osteoarthritis, other specified site: Secondary | ICD-10-CM

## 2021-05-07 DIAGNOSIS — I10 Essential (primary) hypertension: Secondary | ICD-10-CM

## 2021-05-07 MED ORDER — AMLODIPINE BESYLATE 10 MG PO TABS
ORAL_TABLET | Freq: Every day | ORAL | 0 refills | Status: DC
  Filled 2021-05-07: qty 30, 30d supply, fill #0

## 2021-05-07 NOTE — Telephone Encounter (Signed)
Requested medication (s) are due for refill today:   No  Requested medication (s) are on the active medication list:   Yes  Future visit scheduled:   Yes   Last ordered: atorvastatin 12/30/2020 #90, 1 refill;   Mobic 12/28/2020 #90, 1 refill  Returned because protocol failed due to lab work being out of date.   Requested Prescriptions  Pending Prescriptions Disp Refills   atorvastatin (LIPITOR) 20 MG tablet [Pharmacy Med Name: ATORVASTATIN CALCIUM 20 MG Tablet] 90 tablet 1    Sig: Take 1 tablet (20 mg total) by mouth daily.     Cardiovascular:  Antilipid - Statins Failed - 05/07/2021  1:36 PM      Failed - Total Cholesterol in normal range and within 360 days    Cholesterol, Total  Date Value Ref Range Status  12/29/2020 206 (H) 100 - 199 mg/dL Final          Failed - LDL in normal range and within 360 days    LDL Chol Calc (NIH)  Date Value Ref Range Status  12/29/2020 125 (H) 0 - 99 mg/dL Final          Passed - HDL in normal range and within 360 days    HDL  Date Value Ref Range Status  12/29/2020 67 >39 mg/dL Final          Passed - Triglycerides in normal range and within 360 days    Triglycerides  Date Value Ref Range Status  12/29/2020 76 0 - 149 mg/dL Final          Passed - Patient is not pregnant      Passed - Valid encounter within last 12 months    Recent Outpatient Visits           4 months ago Encounter for screening mammogram for malignant neoplasm of breast   [facility] [place], [place], MD   1 year ago Adjustment disorder with mixed anxiety and depressed mood   [facility] [place], Odette Horns, MD   1 year ago Dizziness   [facility] [place], Odette Horns, MD   1 year ago Adjustment disorder with mixed anxiety and depressed mood   [facility] [place], [place] D, LCSW   1 year ago Estrogen deficiency   [facility] [place], Odette Horns, MD       Future Appointments             In 1 month Hoy Register, MD [facility]             meloxicam (MOBIC) 7.5 MG tablet [Pharmacy Med Name: MELOXICAM 7.5 MG Tablet] 90 tablet 1    Sig: Take 1 tablet (7.5 mg total) by mouth daily.     Analgesics:  COX2 Inhibitors Failed - 05/07/2021  1:36 PM      Failed - HGB in normal range and within 360 days    Hemoglobin  Date Value Ref Range Status  01/04/2017 12.0 11.1 - 15.9 g/dL Final          Passed - Cr in normal range and within 360 days    Creat  Date Value Ref Range Status  03/15/2016 0.64 0.50 - 0.99 mg/dL Final    Comment:      For patients > or = 68 years of age: The upper reference limit for Creatinine is approximately 13% higher  for people identified as African-American.      Creatinine, Ser  Date Value Ref Range Status  12/29/2020 0.71 0.57 - 1.00 mg/dL Final          Passed - Patient is not pregnant      Passed - Valid encounter within last 12 months    Recent Outpatient Visits           4 months ago Encounter for screening mammogram for malignant neoplasm of breast   [facility] [place], Odette Horns, MD   1 year ago Adjustment disorder with mixed anxiety and depressed mood   [facility] [place], Odette Horns, MD   1 year ago Dizziness   [facility] [place], Odette Horns, MD   1 year ago Adjustment disorder with mixed anxiety and depressed mood   [facility] [place], [place] D, LCSW   1 year ago Estrogen deficiency   [facility] Hoy Register, MD       Future Appointments             In 1 month Hoy Register, MD [facility]

## 2021-05-07 NOTE — Telephone Encounter (Signed)
Pt is in clinic request emergency med refill for Amlodipine. She does mail order and will take 10-15 days.Next appt not until 2/ 22

## 2021-05-07 NOTE — Telephone Encounter (Signed)
30 day supply has been sent to [facility] pharmacy.

## 2021-06-22 ENCOUNTER — Ambulatory Visit
Admission: RE | Admit: 2021-06-22 | Discharge: 2021-06-22 | Disposition: A | Discharge status: Home / Self Care | Payer: Medicare HMO | Source: Ambulatory Visit | Attending: Family Medicine | Admitting: Family Medicine

## 2021-06-22 DIAGNOSIS — Z780 Asymptomatic menopausal state: Secondary | ICD-10-CM | POA: Diagnosis not present

## 2021-06-22 DIAGNOSIS — E2839 Other primary ovarian failure: Secondary | ICD-10-CM

## 2021-06-24 ENCOUNTER — Telehealth: Payer: Self-pay

## 2021-06-24 NOTE — Telephone Encounter (Signed)
Patient was called and a voicemail was left informing patient to return phone call for lab results.  ° °CRM  created and letter has been mailed. °

## 2021-06-24 NOTE — Telephone Encounter (Signed)
-----   Message from Hoy Register, MD sent at 06/22/2021  6:03 PM EST ----- Bone density is normal

## 2021-06-30 ENCOUNTER — Ambulatory Visit: Payer: Medicare HMO | Attending: Family Medicine | Admitting: Family Medicine

## 2021-06-30 ENCOUNTER — Encounter: Payer: Self-pay | Admitting: Family Medicine

## 2021-06-30 VITALS — BP 136/78 | HR 68 | Ht 64.0 in | Wt 227.0 lb

## 2021-06-30 DIAGNOSIS — R269 Unspecified abnormalities of gait and mobility: Secondary | ICD-10-CM | POA: Diagnosis not present

## 2021-06-30 DIAGNOSIS — R42 Dizziness and giddiness: Secondary | ICD-10-CM

## 2021-06-30 DIAGNOSIS — E7800 Pure hypercholesterolemia, unspecified: Secondary | ICD-10-CM

## 2021-06-30 DIAGNOSIS — F3176 Bipolar disorder, in full remission, most recent episode depressed: Secondary | ICD-10-CM | POA: Diagnosis not present

## 2021-06-30 DIAGNOSIS — K8020 Calculus of gallbladder without cholecystitis without obstruction: Secondary | ICD-10-CM | POA: Diagnosis not present

## 2021-06-30 DIAGNOSIS — M1909 Primary osteoarthritis, other specified site: Secondary | ICD-10-CM | POA: Diagnosis not present

## 2021-06-30 DIAGNOSIS — I10 Essential (primary) hypertension: Secondary | ICD-10-CM

## 2021-06-30 DIAGNOSIS — Z23 Encounter for immunization: Secondary | ICD-10-CM | POA: Diagnosis not present

## 2021-06-30 MED ORDER — CARVEDILOL 3.125 MG PO TABS: 3.1250 mg | ORAL_TABLET | Freq: Two times a day (BID) | ORAL | 1 refills | Status: DC

## 2021-06-30 MED ORDER — MECLIZINE HCL 25 MG PO TABS: 25.0000 mg | ORAL_TABLET | Freq: Two times a day (BID) | ORAL | 1 refills | Status: DC | PRN

## 2021-06-30 MED ORDER — FLUOXETINE HCL 20 MG PO CAPS: 60.0000 mg | ORAL_CAPSULE | Freq: Every day | ORAL | 0 refills | Status: DC

## 2021-06-30 MED ORDER — MELOXICAM 7.5 MG PO TABS: 7.5000 mg | ORAL_TABLET | Freq: Every day | ORAL | 0 refills | Status: DC

## 2021-06-30 MED ORDER — AMLODIPINE BESYLATE 10 MG PO TABS: ORAL_TABLET | Freq: Every day | ORAL | 1 refills | Status: DC

## 2021-06-30 MED ORDER — ATORVASTATIN CALCIUM 40 MG PO TABS: 40.0000 mg | ORAL_TABLET | Freq: Every day | ORAL | 1 refills | Status: DC

## 2021-06-30 NOTE — Patient Instructions (Signed)
Vertigo Vertigo is the feeling that you or the things around you are moving when they are not. This feeling can come and go at any time. Vertigo often goes away on its own. This condition can be dangerous if it happens when you are doingactivities like driving or working with machines. Your doctor will do tests to find the cause of your vertigo. These tests willalso help your doctor decide on the best treatment for you. Follow these instructions at home: Eating and drinking     Drink enough fluid to keep your pee (urine) pale yellow. Do not drink alcohol. Activity Return to your normal activities when your doctor says that it is safe. In the morning, first sit up on the side of the bed. When you feel okay, stand slowly while you hold onto something until you know that your balance is fine. Move slowly. Avoid sudden body or head movements or certain positions, as told by your doctor. Use a cane if you have trouble standing or walking. Sit down right away if you feel dizzy. Avoid doing any tasks or activities that can cause danger to you or others if you get dizzy. Avoid bending down if you feel dizzy. Place items in your home so that they are easy for you to reach without bending or leaning over. Do not drive or use machinery if you feel dizzy. General instructions Take over-the-counter and prescription medicines only as told by your doctor. Keep all follow-up visits. Contact a doctor if: Your medicine does not help your vertigo. Your problems get worse or you have new symptoms. You have a fever. You feel like you may vomit (nauseous), or this feeling gets worse. You start to vomit. Your family or friends see changes in how you act. You lose feeling (have numbness) in part of your body. You feel prickling and tingling in a part of your body. Get help right away if: You are always dizzy. You faint. You get very bad headaches. You get a stiff neck. Bright light starts to bother  you. You have trouble moving or talking. You feel weak in your hands, arms, or legs. You have changes in your hearing or in how you see (vision). These symptoms may be an emergency. Get help right away. Call your local emergency services (911 in the [place] Do not wait to see if the symptoms will go away. Do not drive yourself to the hospital. Summary Vertigo is the feeling that you or the things around you are moving when they are not. Your doctor will do tests to find the cause of your vertigo. You may be told to avoid some tasks, positions, or movements. Contact a doctor if your medicine is not helping, or if you have a fever, new symptoms, or a change in how you act. Get help right away if you get very bad headaches, or if you have changes in how you speak, hear, or see. This information is not intended to replace advice given to you by your health care provider. Make sure you discuss any questions you have with your healthcare provider. Document Revised: 03/25/2020 Document Reviewed: 03/25/2020 Elsevier Patient Education  2022 Elsevier Inc.  

## 2021-06-30 NOTE — Progress Notes (Signed)
Walks to the side. Wants referral to Psych.

## 2021-06-30 NOTE — Progress Notes (Signed)
Subjective:  Patient ID: Stephanie Juarez, female    DOB: [birth year]  Age: 71 y.o. MRN: [id number]  CC: Hypertension   HPI Stephanie Juarez is a 71 y.o. year old female with a history of  hypertension, hyperlipidemia, obesity, chronic left knee pain, bipolar disorder who presents today  for follow-up visit.    Interval History: She complains that when she walks 'she walks to the side' and this has been present for a while. She starts off walking straight then deviates to the side and has to correct herself.  Denies presence of headaches or blurry vision. She denies presence of dizziness with change of position and the room is not spinning.  At a previous visit we had attributed this to her sinus symptoms and she was prescribed Zyrtec but she has not been taking this. CT head from 01/2021 was normal.  She would like a referral to Psych 'to help her organize herself'. She is on Prozac which she states keeps calm and keeps her from overthinking but she states she is not at her best.  She would also like a referral to general surgery for management of her gallstones.  She denies presence of pain but states when she eats beef or fatty foods she has to go to the bathroom soon after. Abdominal ultrasound had previously received: IMPRESSION: Cholelithiasis and gallbladder wall thickening. A negative sonographic Percell Miller sign is noted. This would be consistent with acute cholecystitis however in the appropriate clinical setting.   Past Medical History:  Diagnosis Date   Arthritis    Hypertension     Past Surgical History:  Procedure Laterality Date   MANDIBLE SURGERY Left 1981   impacted teeth   SHOULDER ARTHROSCOPY Left 2001   for cyst    Family History  Problem Relation Age of Onset   Arthritis Mother    Heart disease Mother    Heart disease Father     Allergies  Allergen Reactions   Tramadol Itching   Vicks Nyquil Cold & Flu Night [Dm-Apap-Cpm]     itch    Outpatient  Medications Prior to Visit  Medication Sig Dispense Refill   buPROPion (WELLBUTRIN) 100 MG tablet Take 100 mg by mouth daily.     cetirizine (ZYRTEC) 10 MG tablet Take 1 tablet (10 mg total) by mouth daily. 30 tablet 1   HYDROcodone-acetaminophen (NORCO/VICODIN) 5-325 MG tablet Take 2 tablets by mouth every 4 (four) hours as needed. 20 tablet 0   amLODipine (NORVASC) 10 MG tablet TAKE 1 TABLET (10 MG TOTAL) BY MOUTH DAILY. 30 tablet 0   atorvastatin (LIPITOR) 40 MG tablet Take 1 tablet (40 mg total) by mouth daily. 90 tablet 1   carvedilol (COREG) 3.125 MG tablet Take 1 tablet (3.125 mg total) by mouth 2 (two) times daily with a meal. 180 tablet 1   FLUoxetine (PROZAC) 20 MG capsule Take 3 capsules (60 mg total) by mouth daily. 270 capsule 1   meloxicam (MOBIC) 7.5 MG tablet TAKE 1 TABLET (7.5 MG TOTAL) BY MOUTH DAILY. 90 tablet 0   ondansetron (ZOFRAN ODT) 4 MG disintegrating tablet Take 1 tablet (4 mg total) by mouth every 8 (eight) hours as needed for nausea or vomiting. (Patient not taking: Reported on 01/04/2017) 20 tablet 0   tetrahydrozoline 0.05 % ophthalmic solution Place 1 drop into both eyes daily as needed (dry eyes). (Patient not taking: Reported on 03/23/2020)     No facility-administered medications prior to visit.  ROS Review of Systems  Constitutional:  Negative for activity change, appetite change and fatigue.  HENT:  Negative for congestion, sinus pressure and sore throat.   Eyes:  Negative for visual disturbance.  Respiratory:  Negative for cough, chest tightness, shortness of breath and wheezing.   Cardiovascular:  Negative for chest pain and palpitations.  Gastrointestinal:  Negative for abdominal distention, abdominal pain and constipation.  Endocrine: Negative for polydipsia.  Genitourinary:  Negative for dysuria and frequency.  Musculoskeletal:  Negative for arthralgias and back pain.  Skin:  Negative for rash.  Neurological:  Negative for tremors, weakness,  light-headedness and numbness.  Hematological:  Does not bruise/bleed easily.  Psychiatric/Behavioral:  Negative for agitation and behavioral problems.    Objective:  BP 136/78    Pulse 68    Ht 5' 4" (1.626 m)    Wt 227 lb (103 kg)    SpO2 98%    BMI 38.96 kg/m   BP/Weight 06/30/2021 04/10/2021 4/82/7078  Systolic BP 675 449 201  Diastolic BP 78 60 80  Wt. (Lbs) 227 - 227.6  BMI 38.96 - 39.07      Physical Exam Constitutional:      Appearance: She is well-developed.  HENT:     Head:     Comments: Positive Dix-Hallpike maneuver    Right Ear: There is no impacted cerumen.     Left Ear: There is no impacted cerumen.  Cardiovascular:     Rate and Rhythm: Normal rate.     Heart sounds: Normal heart sounds. No murmur heard. Pulmonary:     Effort: Pulmonary effort is normal.     Breath sounds: Normal breath sounds. No wheezing or rales.  Chest:     Chest wall: No tenderness.  Abdominal:     General: Bowel sounds are normal. There is no distension.     Palpations: Abdomen is soft. There is no mass.     Tenderness: There is no abdominal tenderness.  Musculoskeletal:        General: Normal range of motion.     Right lower leg: No edema.     Left lower leg: No edema.  Neurological:     Mental Status: She is alert and oriented to person, place, and time.     Sensory: No sensory deficit.     Motor: No weakness.     Coordination: Coordination normal.     Gait: Gait normal.  Psychiatric:        Mood and Affect: Mood normal.    CMP Latest Ref Rng & Units 12/29/2020 03/23/2020 05/14/2019  Glucose 65 - 99 mg/dL 89 85 87  BUN 8 - 27 mg/dL _0 Creatinine 0.57 - 1.00 mg/dL 0.71 0.92 0.62  Sodium 134 - 144 mmol/L 143 142 141  Potassium 3.5 - 5.2 mmol/L 3.6 4.2 4.0  Chloride 96 - 106 mmol/L 109(H) 104 104  CO2 20 - 29 mmol/L _1 Calcium 8.7 - 10.3 mg/dL 9.0 9.5 9.4  Total Protein 6.0 - 8.5 g/dL 6.8 - 7.7  Total Bilirubin 0.0 - 1.2 mg/dL 0.6 - 0.7  Alkaline Phos 44 - 121  IU/L 69 - 83  AST 0 - 40 IU/L 17 - 17  ALT 0 - 32 IU/L 13 - 11    Lipid Panel     Component Value Date/Time   CHOL 206 (H) 12/29/2020 0858   TRIG 76 12/29/2020 0858   HDL 67 12/29/2020 0858   CHOLHDL 3.1 12/29/2020 0071  CHOLHDL 3.4 05/20/2015 0917   VLDL 13 05/20/2015 0917   LDLCALC 125 (H) 12/29/2020 0858    CBC    Component Value Date/Time   WBC 5.5 01/04/2017 1501   WBC 6.7 03/12/2016 0834   RBC 4.57 01/04/2017 1501   RBC 4.66 03/12/2016 0834   HGB 12.0 01/04/2017 1501   HCT 38.1 01/04/2017 1501   PLT 194 01/04/2017 1501   MCV 83 01/04/2017 1501   MCH 26.3 (L) 01/04/2017 1501   MCH 26.2 03/12/2016 0834   MCHC 31.5 01/04/2017 1501   MCHC 31.0 03/12/2016 0834   RDW 14.2 01/04/2017 1501   LYMPHSABS 2.1 01/04/2017 1501   MONOABS 0.5 07/29/2013 1806   EOSABS 0.3 01/04/2017 1501   BASOSABS 0.1 01/04/2017 1501    Lab Results  Component Value Date   HGBA1C 4.8 01/04/2017    The 10-year ASCVD risk score (Arnett DK, et al., 2019) is: 12.4%   Values used to calculate the score:     Age: 53 years     Sex: Female     Is Non-Hispanic African American: Yes     Diabetic: No     Tobacco smoker: No     Systolic Blood Pressure: 751 mmHg     Is BP treated: Yes     HDL Cholesterol: 67 mg/dL     Total Cholesterol: 206 mg/dL  Assessment & Plan:  1. Essential hypertension Controlled Counseled on blood pressure goal of less than 130/80, low-sodium, DASH diet, medication compliance, 150 minutes of moderate intensity exercise per week. Discussed medication compliance, adverse effects. - amLODipine (NORVASC) 10 MG tablet; TAKE 1 TABLET (10 MG TOTAL) BY MOUTH DAILY.  Dispense: 90 tablet; Refill: 1 - carvedilol (COREG) 3.125 MG tablet; Take 1 tablet (3.125 mg total) by mouth 2 (two) times daily with a meal.  Dispense: 180 tablet; Refill: 1 - CMP14+EGFR - LP+Non-HDL Cholesterol  2. Pure hypercholesterolemia Slightly elevated from last set of labs Lipid panel ordered  today Continue statin and low-cholesterol diet - atorvastatin (LIPITOR) 40 MG tablet; Take 1 tablet (40 mg total) by mouth daily.  Dispense: 90 tablet; Refill: 1  3. Bipolar disorder, in full remission, most recent episode depressed ([place]) Suboptimal control - Ambulatory referral to Psychiatry - FLUoxetine (PROZAC) 20 MG capsule; Take 3 capsules (60 mg total) by mouth daily.  Dispense: 270 capsule; Refill: 0  4. Primary osteoarthritis of other site Stable - meloxicam (MOBIC) 7.5 MG tablet; Take 1 tablet (7.5 mg total) by mouth daily.  Dispense: 90 tablet; Refill: 0  5. Calculus of gallbladder without cholecystitis without obstruction She has no pain but does have dietary restrictions - Ambulatory referral to General Surgery  6. Vertigo Dix-Hallpike maneuver positive CT head negative Trial of meclizine - meclizine (ANTIVERT) 25 MG tablet; Take 1 tablet (25 mg total) by mouth 2 (two) times daily as needed for dizziness.  Dispense: 60 tablet; Refill: 1  7. Abnormal gait See #6 above Coordination tests are normal Advised to call the clinic if symptoms do not improve with meclizine and I will refer her to neurology.    Meds ordered this encounter  Medications   meclizine (ANTIVERT) 25 MG tablet    Sig: Take 1 tablet (25 mg total) by mouth 2 (two) times daily as needed for dizziness.    Dispense:  60 tablet    Refill:  1   amLODipine (NORVASC) 10 MG tablet    Sig: TAKE 1 TABLET (10 MG TOTAL) BY MOUTH DAILY.  Dispense:  90 tablet    Refill:  1   atorvastatin (LIPITOR) 40 MG tablet    Sig: Take 1 tablet (40 mg total) by mouth daily.    Dispense:  90 tablet    Refill:  1   carvedilol (COREG) 3.125 MG tablet    Sig: Take 1 tablet (3.125 mg total) by mouth 2 (two) times daily with a meal.    Dispense:  180 tablet    Refill:  1   FLUoxetine (PROZAC) 20 MG capsule    Sig: Take 3 capsules (60 mg total) by mouth daily.    Dispense:  270 capsule    Refill:  0   meloxicam (MOBIC)  7.5 MG tablet    Sig: Take 1 tablet (7.5 mg total) by mouth daily.    Dispense:  90 tablet    Refill:  0    Follow-up: Return in about 3 months (around 09/27/2021) for Chronic medical conditions.       Charlott Rakes, MD, FAAFP. [facility] [place], [place]   06/30/2021, 10:31 AM

## 2021-07-01 LAB — CMP14+EGFR
ALT: 13 IU/L (ref 0–32)
AST: 18 IU/L (ref 0–40)
Albumin/Globulin Ratio: 1.4 (ref 1.2–2.2)
Albumin: 4.5 g/dL (ref 3.8–4.8)
Alkaline Phosphatase: 74 IU/L (ref 44–121)
BUN/Creatinine Ratio: 22 (ref 12–28)
BUN: 15 mg/dL (ref 8–27)
Bilirubin Total: 0.8 mg/dL (ref 0.0–1.2)
CO2: 23 mmol/L (ref 20–29)
Calcium: 9.7 mg/dL (ref 8.7–10.3)
Chloride: 106 mmol/L (ref 96–106)
Creatinine, Ser: 0.69 mg/dL (ref 0.57–1.00)
Globulin, Total: 3.2 g/dL (ref 1.5–4.5)
Glucose: 104 mg/dL — ABNORMAL HIGH (ref 70–99)
Potassium: 3.6 mmol/L (ref 3.5–5.2)
Sodium: 141 mmol/L (ref 134–144)
Total Protein: 7.7 g/dL (ref 6.0–8.5)
eGFR: 94 mL/min/{1.73_m2} (ref >59)

## 2021-07-01 LAB — LP+NON-HDL CHOLESTEROL
Cholesterol, Total: 240 mg/dL — ABNORMAL HIGH (ref 100–199)
HDL: 83 mg/dL (ref >39)
LDL Chol Calc (NIH): 145 mg/dL — ABNORMAL HIGH (ref 0–99)
Total Non-HDL-Chol (LDL+VLDL): 157 mg/dL — ABNORMAL HIGH (ref 0–129)
Triglycerides: 70 mg/dL (ref 0–149)
VLDL Cholesterol Cal: 12 mg/dL (ref 5–40)

## 2021-07-02 ENCOUNTER — Telehealth: Payer: Self-pay

## 2021-07-02 NOTE — Telephone Encounter (Signed)
Contacted pt to go over lab results pt didn't answer lvm   Sent a CRM and forward labs to NT to give pt labs when they call back   

## 2021-07-18 ENCOUNTER — Other Ambulatory Visit: Payer: Self-pay | Admitting: Family Medicine

## 2021-07-18 DIAGNOSIS — F3176 Bipolar disorder, in full remission, most recent episode depressed: Secondary | ICD-10-CM

## 2021-07-19 NOTE — Telephone Encounter (Signed)
Pharmacy received refill 06/30/21. ?Requested Prescriptions  ?Refused Prescriptions Disp Refills  ?? FLUoxetine (PROZAC) 20 MG capsule [Pharmacy Med Name: FLUOXETINE HYDROCHLORIDE 20 MG Capsule] 270 capsule 0  ?  Sig: TAKE 3 CAPSULES (60 MG TOTAL) BY MOUTH DAILY.  ?  ? Psychiatry:  Antidepressants - SSRI Passed - 07/18/2021  6:21 AM  ?  ?  Passed - Valid encounter within last 6 months  ?  Recent Outpatient Visits   ?      ? 2 weeks ago Calculus of gallbladder without cholecystitis without obstruction  ? [facility] Hoy Register, MD  ? 6 months ago Encounter for screening mammogram for malignant neoplasm of breast  ? [facility] [place], Odette Horns, MD  ? 1 year ago Adjustment disorder with mixed anxiety and depressed mood  ? [facility] Hoy Register, MD  ? 1 year ago Dizziness  ? [facility] [place], Odette Horns, MD  ? 1 year ago Adjustment disorder with mixed anxiety and depressed mood  ? [facility] [place], [place] D, LCSW  ?  ?  ? ?  ?  ?  ? ?

## 2021-08-03 ENCOUNTER — Ambulatory Visit: Payer: Self-pay | Admitting: Surgery

## 2021-08-03 DIAGNOSIS — Z6841 Body Mass Index (BMI) 40.0 and over, adult: Secondary | ICD-10-CM | POA: Diagnosis not present

## 2021-08-03 DIAGNOSIS — K8010 Calculus of gallbladder with chronic cholecystitis without obstruction: Secondary | ICD-10-CM | POA: Diagnosis not present

## 2021-08-26 DIAGNOSIS — T161XXA Foreign body in right ear, initial encounter: Secondary | ICD-10-CM | POA: Diagnosis not present

## 2021-08-26 DIAGNOSIS — T162XXA Foreign body in left ear, initial encounter: Secondary | ICD-10-CM | POA: Diagnosis not present

## 2021-09-10 ENCOUNTER — Other Ambulatory Visit: Payer: Self-pay | Admitting: Surgery

## 2021-09-10 DIAGNOSIS — K429 Umbilical hernia without obstruction or gangrene: Secondary | ICD-10-CM | POA: Diagnosis not present

## 2021-09-10 DIAGNOSIS — K8010 Calculus of gallbladder with chronic cholecystitis without obstruction: Secondary | ICD-10-CM | POA: Diagnosis not present

## 2021-09-10 DIAGNOSIS — K811 Chronic cholecystitis: Secondary | ICD-10-CM | POA: Diagnosis not present

## 2021-09-10 DIAGNOSIS — K760 Fatty (change of) liver, not elsewhere classified: Secondary | ICD-10-CM | POA: Diagnosis not present

## 2021-09-10 DIAGNOSIS — K7581 Nonalcoholic steatohepatitis (NASH): Secondary | ICD-10-CM | POA: Diagnosis not present

## 2021-09-10 DIAGNOSIS — K7689 Other specified diseases of liver: Secondary | ICD-10-CM | POA: Diagnosis not present

## 2021-09-25 ENCOUNTER — Other Ambulatory Visit: Payer: Self-pay | Admitting: Family Medicine

## 2021-09-25 DIAGNOSIS — F3176 Bipolar disorder, in full remission, most recent episode depressed: Secondary | ICD-10-CM

## 2021-09-27 NOTE — Telephone Encounter (Signed)
Requested Prescriptions  Pending Prescriptions Disp Refills  . FLUoxetine (PROZAC) 20 MG capsule [Pharmacy Med Name: FLUOXETINE HYDROCHLORIDE 20 MG Capsule] 270 capsule 0    Sig: TAKE 3 CAPSULES (60 MG TOTAL) BY MOUTH DAILY.     Psychiatry:  Antidepressants - SSRI Passed - 09/25/2021  3:28 AM      Passed - Valid encounter within last 6 months    Recent Outpatient Visits          2 months ago Calculus of gallbladder without cholecystitis without obstruction   [facility] Hoy Register, MD   9 months ago Encounter for screening mammogram for malignant neoplasm of breast   [facility] [place], Odette Horns, MD   1 year ago Adjustment disorder with mixed anxiety and depressed mood   [facility] Hoy Register, MD   2 years ago Dizziness   [facility] [place], Odette Horns, MD   2 years ago Adjustment disorder with mixed anxiety and depressed mood   [facility] [place], [place] D, [place]

## 2021-10-07 ENCOUNTER — Other Ambulatory Visit: Payer: Self-pay | Admitting: Family Medicine

## 2021-10-07 DIAGNOSIS — M1909 Primary osteoarthritis, other specified site: Secondary | ICD-10-CM

## 2021-11-24 ENCOUNTER — Other Ambulatory Visit: Payer: Self-pay | Admitting: Family Medicine

## 2021-11-24 DIAGNOSIS — I10 Essential (primary) hypertension: Secondary | ICD-10-CM

## 2021-11-24 DIAGNOSIS — E7800 Pure hypercholesterolemia, unspecified: Secondary | ICD-10-CM

## 2022-01-16 ENCOUNTER — Other Ambulatory Visit: Payer: Self-pay | Admitting: Family Medicine

## 2022-01-16 DIAGNOSIS — E7800 Pure hypercholesterolemia, unspecified: Secondary | ICD-10-CM

## 2022-01-16 DIAGNOSIS — I10 Essential (primary) hypertension: Secondary | ICD-10-CM

## 2022-02-20 ENCOUNTER — Other Ambulatory Visit: Payer: Self-pay | Admitting: Family Medicine

## 2022-02-20 DIAGNOSIS — F3176 Bipolar disorder, in full remission, most recent episode depressed: Secondary | ICD-10-CM

## 2022-02-21 MED ORDER — CARVEDILOL 3.125 MG PO TABS: 3.1250 mg | ORAL_TABLET | Freq: Two times a day (BID) | ORAL | 0 refills | Status: DC

## 2022-02-21 MED ORDER — AMLODIPINE BESYLATE 10 MG PO TABS: 10.0000 mg | ORAL_TABLET | Freq: Every day | ORAL | 0 refills | Status: DC

## 2022-02-21 MED ORDER — ATORVASTATIN CALCIUM 40 MG PO TABS: 40.0000 mg | ORAL_TABLET | Freq: Every day | ORAL | 0 refills | Status: DC

## 2022-02-21 NOTE — Addendum Note (Signed)
Addended by: Matilde Sprang on: 02/21/2022 02:29 PM   Modules accepted: Orders

## 2022-02-21 NOTE — Telephone Encounter (Addendum)
Pt following up on call from office and refill request.  Advised pt she needs appt. Pt has been scheduled for first available, Dec 20 at 10 am. Can she get enough to get her through to appt?  [place], [place]

## 2022-02-21 NOTE — Telephone Encounter (Signed)
Courtesy refill. Patient will need an office visit for further refills. Requested Prescriptions  Pending Prescriptions Disp Refills  . FLUoxetine (PROZAC) 20 MG capsule [Pharmacy Med Name: FLUOXETINE HYDROCHLORIDE 20 MG Capsule] 90 capsule 0    Sig: TAKE 3 CAPSULES (60 MG TOTAL) BY MOUTH DAILY.     Psychiatry:  Antidepressants - SSRI Failed - 02/20/2022  4:24 AM      Failed - Valid encounter within last 6 months    Recent Outpatient Visits          7 months ago Calculus of gallbladder without cholecystitis without obstruction   [facility] Charlott Rakes, MD   1 year ago Encounter for screening mammogram for malignant neoplasm of breast   [place], Charlane Ferretti, MD   1 year ago Adjustment disorder with mixed anxiety and depressed mood   [place], [place], MD   2 years ago [place], Charlane Ferretti, MD   2 years ago Adjustment disorder with mixed anxiety and depressed mood   [place], [place]

## 2022-02-21 NOTE — Telephone Encounter (Signed)
Called pt  - unable to [place] is full

## 2022-02-21 NOTE — Telephone Encounter (Signed)
Courtesy refill given to last until appointment.  Requested Prescriptions  Pending Prescriptions Disp Refills  . amLODipine (NORVASC) 10 MG tablet 64 tablet 0    Sig: Take 1 tablet (10 mg total) by mouth daily. OFFICE VISIT NEEDED FOR ADDITIONAL REFILLS     Cardiovascular: Calcium Channel Blockers 2 Failed - 02/21/2022  2:29 PM      Failed - Valid encounter within last 6 months    Recent Outpatient Visits          7 months ago Calculus of gallbladder without cholecystitis without obstruction   [facility] [place], Stephanie Horns, MD   1 year ago Encounter for screening mammogram for malignant neoplasm of breast   [facility] [place], Stephanie Horns, MD   1 year ago Adjustment disorder with mixed anxiety and depressed mood   [facility] [place], Stephanie Horns, MD   2 years ago Dizziness   [facility] [place], Stephanie Horns, MD   2 years ago Adjustment disorder with mixed anxiety and depressed mood   [facility] [place], Lilly D, LCSW      Future Appointments            In 2 months [place], Angela M, [place] [facility]           Passed - Last BP in normal range    BP Readings from Last 1 Encounters:  06/30/21 136/78         Passed - Last Heart Rate in normal range    Pulse Readings from Last 1 Encounters:  06/30/21 68         . carvedilol (COREG) 3.125 MG tablet 128 tablet 0    Sig: Take 1 tablet (3.125 mg total) by mouth 2 (two) times daily with a meal. OFFICE VISIT NEEDED FOR ADDITIONAL REFILLS     Cardiovascular: Beta Blockers 3 Failed - 02/21/2022  2:29 PM      Failed - Valid encounter within last 6 months    Recent Outpatient Visits          7 months ago Calculus of gallbladder without cholecystitis without obstruction   [facility] Hoy Register, MD   1 year ago Encounter for  screening mammogram for malignant neoplasm of breast   [facility] [place], Stephanie Horns, MD   1 year ago Adjustment disorder with mixed anxiety and depressed mood   [facility] [place], Stephanie Horns, MD   2 years ago Dizziness   [facility] [place], [place], MD   2 years ago Adjustment disorder with mixed anxiety and depressed mood   [place] [facility] [address] [facility] [place], Christiana D, LCSW      Future Appointments            In 2 months [place], Marzella Schlein, [place] [facility]           Passed - Cr in normal range and within 360 days    Creat  Date Value Ref Range Status  03/15/2016 0.64 0.50 - 0.99 mg/dL Final    Comment:      For patients > or = 69 years of age: The upper reference limit for Creatinine is approximately 13% higher for people identified as African-American.      Creatinine, Ser  Date Value Ref Range Status  06/30/2021 0.69 0.57 - 1.00 mg/dL Final         Passed - AST in normal range and within 360 days    AST  Date Value Ref Range Status  06/30/2021 18 0 - 40 IU/L Final         Passed - ALT in normal range and within 360 days    ALT  Date Value Ref Range Status  06/30/2021 13 0 - 32 IU/L Final         Passed - Last BP in normal range    BP Readings from Last 1 Encounters:  06/30/21 136/78         Passed - Last Heart Rate in normal range    Pulse Readings from Last 1 Encounters:  06/30/21 68         . atorvastatin (LIPITOR) 40 MG tablet 64 tablet 0    Sig: Take 1 tablet (40 mg total) by mouth daily. OFFICE VISIT NEEDED FOR ADDITIONAL REFILLS     Cardiovascular:  Antilipid - Statins Failed - 02/21/2022  2:29 PM      Failed - Lipid Panel in normal range within the last 12 months    Cholesterol, Total  Date Value Ref Range Status  06/30/2021 240 (H) 100 - 199 mg/dL Final   LDL Chol Calc (NIH)  Date Value Ref Range Status   06/30/2021 145 (H) 0 - 99 mg/dL Final   HDL  Date Value Ref Range Status  06/30/2021 83 >39 mg/dL Final   Triglycerides  Date Value Ref Range Status  06/30/2021 70 0 - 149 mg/dL Final         Passed - Patient is not pregnant      Passed - Valid encounter within last 12 months    Recent Outpatient Visits          7 months ago Calculus of gallbladder without cholecystitis without obstruction   [facility] [place], Stephanie Horns, MD   1 year ago Encounter for screening mammogram for malignant neoplasm of breast   [facility] [place], Stephanie Horns, MD   1 year ago Adjustment disorder with mixed anxiety and depressed mood   [facility] [place], Stephanie Horns, MD   2 years ago Dizziness   [facility] [place], Stephanie Horns, MD   2 years ago Adjustment disorder with mixed anxiety and depressed mood   [facility] Jenel Lucks D, LCSW      Future Appointments            In 2 months [place], Marzella Schlein, PA-C [facility] [facility]           Refused Prescriptions Disp Refills  . carvedilol (COREG) 3.125 MG tablet [Pharmacy Med Name: CARVEDILOL 3.125 MG Tablet] 120 tablet     Sig: TAKE 1 TABLET TWICE DAILY WITH MEALS (NEED MD APPOINTMENT FOR REFILLS)     Cardiovascular: Beta Blockers 3 Failed - 02/21/2022  2:29 PM      Failed - Valid encounter within last 6 months    Recent Outpatient Visits          7 months ago Calculus of gallbladder without cholecystitis without obstruction   [facility] Hoy Register, MD   1 year ago Encounter for screening mammogram for malignant neoplasm of breast    [facility] Hoy Register, MD   1 year  ago Adjustment disorder with mixed anxiety and depressed mood   [facility] [place], Stephanie Horns, MD   2 years ago  Dizziness   [facility] [place], [place], MD   2 years ago Adjustment disorder with mixed anxiety and depressed mood   [place] [address] [facility] Jenel Lucks D, LCSW      Future Appointments            In 2 months [place], Marzella Schlein, [place] [facility]           Passed - Cr in normal range and within 360 days    Creat  Date Value Ref Range Status  03/15/2016 0.64 0.50 - 0.99 mg/dL Final    Comment:      For patients > or = 69 years of age: The upper reference limit for Creatinine is approximately 13% higher for people identified as African-American.      Creatinine, Ser  Date Value Ref Range Status  06/30/2021 0.69 0.57 - 1.00 mg/dL Final         Passed - AST in normal range and within 360 days    AST  Date Value Ref Range Status  06/30/2021 18 0 - 40 IU/L Final         Passed - ALT in normal range and within 360 days    ALT  Date Value Ref Range Status  06/30/2021 13 0 - 32 IU/L Final         Passed - Last BP in normal range    BP Readings from Last 1 Encounters:  06/30/21 136/78         Passed - Last Heart Rate in normal range    Pulse Readings from Last 1 Encounters:  06/30/21 68         . atorvastatin (LIPITOR) 40 MG tablet [Pharmacy Med Name: ATORVASTATIN CALCIUM 40 MG Tablet] 60 tablet     Sig: TAKE 1 TABLET EVERY DAY     Cardiovascular:  Antilipid - Statins Failed - 02/21/2022  2:29 PM      Failed - Lipid Panel in normal range within the last 12 months    Cholesterol, Total  Date Value Ref Range Status  06/30/2021 240 (H) 100 - 199 mg/dL Final   LDL Chol Calc (NIH)  Date Value Ref Range Status  06/30/2021 145 (H) 0 - 99 mg/dL Final   HDL  Date Value Ref Range Status  06/30/2021 83 >39 mg/dL Final   Triglycerides  Date Value Ref Range Status  06/30/2021 70 0 - 149 mg/dL Final         Passed - Patient is not pregnant      Passed - Valid encounter within last 12  months    Recent Outpatient Visits          7 months ago Calculus of gallbladder without cholecystitis without obstruction   [facility] Hoy Register, MD   1 year ago Encounter for screening mammogram for malignant neoplasm of breast   [facility] [place], Stephanie Horns, MD   1 year ago Adjustment disorder with mixed anxiety and depressed mood   [facility] [place], Stephanie Horns, MD   2 years ago Dizziness   [facility] [place], Stephanie Horns, MD   2 years ago Adjustment disorder with mixed anxiety and depressed mood   [facility]  [place], [place], LCSW      Future Appointments            In 2 months [place], Dionne Bucy, PA-C [place]           . amLODipine (NORVASC) 10 MG tablet [Pharmacy Med Name: AMLODIPINE BESYLATE 10 MG Tablet] 60 tablet     Sig: TAKE 1 TABLET EVERY DAY     Cardiovascular: Calcium Channel Blockers 2 Failed - 02/21/2022  2:29 PM      Failed - Valid encounter within last 6 months    Recent Outpatient Visits          7 months ago Calculus of gallbladder without cholecystitis without obstruction   [facility] Charlott Rakes, MD   1 year ago Encounter for screening mammogram for malignant neoplasm of breast   [place], MD   1 year ago Adjustment disorder with mixed anxiety and depressed mood   [place], MD   2 years ago [place], Charlane Ferretti, MD   2 years ago Adjustment disorder with mixed anxiety and depressed mood   [place], [place] D, LCSW      Future Appointments            In 2 months [place], Dionne Bucy, [place] BP in normal range    BP Readings from Last 1 Encounters:  06/30/21 136/78         Passed - Last Heart Rate in normal range    Pulse Readings from Last 1 Encounters:  06/30/21 68

## 2022-04-15 ENCOUNTER — Other Ambulatory Visit: Payer: Self-pay | Admitting: Family Medicine

## 2022-04-15 DIAGNOSIS — F3176 Bipolar disorder, in full remission, most recent episode depressed: Secondary | ICD-10-CM

## 2022-04-15 DIAGNOSIS — E7800 Pure hypercholesterolemia, unspecified: Secondary | ICD-10-CM

## 2022-04-15 DIAGNOSIS — I10 Essential (primary) hypertension: Secondary | ICD-10-CM

## 2022-04-27 ENCOUNTER — Ambulatory Visit: Payer: Medicare HMO | Attending: Physician Assistant | Admitting: Physician Assistant

## 2022-04-27 ENCOUNTER — Encounter: Payer: Self-pay | Admitting: Physician Assistant

## 2022-04-27 VITALS — BP 151/77 | HR 71 | Temp 97.9°F | Ht 64.0 in | Wt 225.0 lb

## 2022-04-27 DIAGNOSIS — I10 Essential (primary) hypertension: Secondary | ICD-10-CM | POA: Diagnosis not present

## 2022-04-27 DIAGNOSIS — K909 Intestinal malabsorption, unspecified: Secondary | ICD-10-CM | POA: Diagnosis not present

## 2022-04-27 DIAGNOSIS — M1909 Primary osteoarthritis, other specified site: Secondary | ICD-10-CM

## 2022-04-27 DIAGNOSIS — Z131 Encounter for screening for diabetes mellitus: Secondary | ICD-10-CM | POA: Diagnosis not present

## 2022-04-27 DIAGNOSIS — F3176 Bipolar disorder, in full remission, most recent episode depressed: Secondary | ICD-10-CM

## 2022-04-27 DIAGNOSIS — M654 Radial styloid tenosynovitis [de Quervain]: Secondary | ICD-10-CM

## 2022-04-27 DIAGNOSIS — E7800 Pure hypercholesterolemia, unspecified: Secondary | ICD-10-CM

## 2022-04-27 DIAGNOSIS — Z23 Encounter for immunization: Secondary | ICD-10-CM

## 2022-04-27 DIAGNOSIS — Z7185 Encounter for immunization safety counseling: Secondary | ICD-10-CM

## 2022-04-27 DIAGNOSIS — R197 Diarrhea, unspecified: Secondary | ICD-10-CM | POA: Diagnosis not present

## 2022-04-27 MED ORDER — AMLODIPINE BESYLATE 10 MG PO TABS: 10.0000 mg | ORAL_TABLET | Freq: Every day | ORAL | 1 refills | Status: DC

## 2022-04-27 MED ORDER — CARVEDILOL 3.125 MG PO TABS: 3.1250 mg | ORAL_TABLET | Freq: Two times a day (BID) | ORAL | 1 refills | Status: DC

## 2022-04-27 MED ORDER — DICLOFENAC SODIUM 1 % EX GEL: 2.0000 g | Freq: Four times a day (QID) | CUTANEOUS | 2 refills | Status: AC

## 2022-04-27 MED ORDER — MELOXICAM 7.5 MG PO TABS: 7.5000 mg | ORAL_TABLET | Freq: Every day | ORAL | 0 refills | Status: DC

## 2022-04-27 MED ORDER — FLUOXETINE HCL 20 MG PO CAPS: ORAL_CAPSULE | ORAL | 1 refills | Status: DC

## 2022-04-27 MED ORDER — ATORVASTATIN CALCIUM 40 MG PO TABS: 40.0000 mg | ORAL_TABLET | Freq: Every day | ORAL | 1 refills | Status: DC

## 2022-04-27 NOTE — Progress Notes (Signed)
Patient ID: Stephanie Juarez, female   DOB: [birth year], 71 y.o.   MRN: [id number]    Stephanie Juarez, is a 71 y.o. female  [id number]  [id number]  DOB - 21-Aug-1950  Chief Complaint  Patient presents with   Knee Pain    Knee pain due to weather changes.  Pt's gallbladder was removed & having occasional diarrhea episodes. Experiencing diarrhea accidents.  Discuss flu vax, RSV, penumonia.       Subjective:   Stephanie Juarez is a 71 y.o. female here today for multiple concerns and needs medication RF.  Denies CP/SOB/dizziness/HA.    R knee pain worse on and off due to weather changes.  Known osteoarthritis.  Meloxicam 7.5 helped some.  She feels the pain is worse when outdoor temperatures quickly go from cold to hot or vice versa.  Worse when she has been still for a while but does not cause pain daily.    R thumb pain on and off for several months.  She is R hand dominant.  NKI.  Clicking sound at times  Also c/o loose BM since gallbladder surgery in may.  She has sometimes had accidents.  She has found that taking loperamide a couple of times a week has helped.  She denies abdominal pain r any acute symptoms.  No melena or hematochezia.   No problems updated.  ALLERGIES: Allergies  Allergen Reactions   Tramadol Itching   Vicks Nyquil Cold & Flu Night [Dm-Apap-Cpm]     itch    PAST MEDICAL HISTORY: Past Medical History:  Diagnosis Date   Arthritis    Hypertension     MEDICATIONS AT HOME: Prior to Admission medications   Medication Sig Start Date End Date Taking? Authorizing Provider  buPROPion (WELLBUTRIN) 100 MG tablet Take 100 mg by mouth daily.   Yes [provider]  cetirizine (ZYRTEC) 10 MG tablet Take 1 tablet (10 mg total) by mouth daily. 03/23/20  Yes Charlott Rakes, MD  diclofenac Sodium (VOLTAREN ARTHRITIS PAIN) 1 % GEL Apply 2 g topically 4 (four) times daily. 04/27/22  Yes Gita Dilger M, PA-C  meclizine (ANTIVERT) 25 MG tablet Take 1 tablet  (25 mg total) by mouth 2 (two) times daily as needed for dizziness. 06/30/21  Yes Charlott Rakes, MD  amLODipine (NORVASC) 10 MG tablet Take 1 tablet (10 mg total) by mouth daily. OFFICE VISIT NEEDED FOR ADDITIONAL REFILLS 04/27/22   Argentina Donovan, PA-C  atorvastatin (LIPITOR) 40 MG tablet Take 1 tablet (40 mg total) by mouth daily. OFFICE VISIT NEEDED FOR ADDITIONAL REFILLS 04/27/22   Argentina Donovan, PA-C  carvedilol (COREG) 3.125 MG tablet Take 1 tablet (3.125 mg total) by mouth 2 (two) times daily with a meal. OFFICE VISIT NEEDED FOR ADDITIONAL REFILLS 04/27/22   Argentina Donovan, PA-C  FLUoxetine (PROZAC) 20 MG capsule TAKE 3 CAPSULES EVERY DAY (APPOINTMENT IS NEEDED FOR REFILLS) 04/27/22   Argentina Donovan, PA-C  HYDROcodone-acetaminophen (NORCO/VICODIN) 5-325 MG tablet Take 2 tablets by mouth every 4 (four) hours as needed. Patient not taking: Reported on 04/27/2022 03/12/16   Zenovia Jarred, DO  meloxicam (MOBIC) 7.5 MG tablet Take 1 tablet (7.5 mg total) by mouth daily. 04/27/22   Argentina Donovan, PA-C  ondansetron (ZOFRAN ODT) 4 MG disintegrating tablet Take 1 tablet (4 mg total) by mouth every 8 (eight) hours as needed for nausea or vomiting. Patient not taking: Reported on 01/04/2017 03/12/16   Zenovia Jarred, DO  tetrahydrozoline 0.05 % ophthalmic solution  Place 1 drop into both eyes daily as needed (dry eyes). Patient not taking: Reported on 03/23/2020    [provider]  loratadine (CLARITIN) 10 MG tablet Take 1 tablet (10 mg total) by mouth daily. Patient not taking: Reported on 06/26/2014 09/09/13 08/05/14  Ambrose Finland, NP    ROS: Neg HEENT Neg resp Neg cardiac Neg GI Neg psych Neg neuro  Objective:   Vitals:   04/27/22 1017  BP: (!) 151/77  Pulse: 71  Temp: 97.9 F (36.6 C)  TempSrc: Oral  SpO2: 98%  Weight: 225 lb (102.1 kg)  Height: 5\' 4"  (1.626 m)   Exam General appearance : Awake, alert, not in any distress. Speech Clear. Not toxic  looking HEENT: Atraumatic and Normocephalic Neck: Supple, no JVD. No cervical lymphadenopathy.  Chest: Good air entry bilaterally, CTAB.  No rales/rhonchi/wheezing CVS: S1 S2 regular, no murmurs.  R vs L hand examined.  Normal grip, S&ROM.  + finkelstein's on R R vs L knee examined.  No ballotment or erythema.  Ligaments stable Extremities: B/L Lower Ext shows no edema, both legs are warm to touch Neurology: Awake alert, and oriented X 3, CN II-XII intact, Non focal Skin: No Rash  Data Review Lab Results  Component Value Date   HGBA1C 4.8 01/04/2017   HGBA1C 5.30 05/13/2015    Assessment & Plan   1. Essential hypertension She did not take meds last couple of days.  Take meds and check BP OOO goal <130/85 - amLODipine (NORVASC) 10 MG tablet; Take 1 tablet (10 mg total) by mouth daily. OFFICE VISIT NEEDED FOR ADDITIONAL REFILLS  Dispense: 90 tablet; Refill: 1 - carvedilol (COREG) 3.125 MG tablet; Take 1 tablet (3.125 mg total) by mouth 2 (two) times daily with a meal. OFFICE VISIT NEEDED FOR ADDITIONAL REFILLS  Dispense: 180 tablet; Refill: 1 - Comprehensive metabolic panel  2. Pure hypercholesterolemia - atorvastatin (LIPITOR) 40 MG tablet; Take 1 tablet (40 mg total) by mouth daily. OFFICE VISIT NEEDED FOR ADDITIONAL REFILLS  Dispense: 90 tablet; Refill: 1 - Lipid panel - Comprehensive metabolic panel  3. Bipolar disorder, in full remission, most recent episode depressed (HCC) stable - FLUoxetine (PROZAC) 20 MG capsule; TAKE 3 CAPSULES EVERY DAY (APPOINTMENT IS NEEDED FOR REFILLS)  Dispense: 270 capsule; Refill: 1  4. Primary osteoarthritis of other site - meloxicam (MOBIC) 7.5 MG tablet; Take 1 tablet (7.5 mg total) by mouth daily.  Dispense: 90 tablet; Refill: 0 She can take 2 daily sparingly when knee pain is worse - diclofenac Sodium (VOLTAREN ARTHRITIS PAIN) 1 % GEL; Apply 2 g topically 4 (four) times daily.  Dispense: 100 g; Refill: 2  5. Screening for diabetes  mellitus - Hemoglobin A1c  6. De Quervain's disease (tenosynovitis) Thumb spica splint - meloxicam (MOBIC) 7.5 MG tablet; Take 1 tablet (7.5 mg total) by mouth daily.  Dispense: 90 tablet; Refill: 0 - diclofenac Sodium (VOLTAREN ARTHRITIS PAIN) 1 % GEL; Apply 2 g topically 4 (four) times daily.  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  The patient was given clear instructions to go to ER or return to medical center if symptoms don't improve, worsen or new problems develop. The patient verbalized understanding. The patient was  told to call to get lab results if they haven't heard anything in the next week.      Freeman Caldron, PA-C [facility] [place], [place]   04/27/2022, 11:06 AM

## 2022-04-27 NOTE — Patient Instructions (Signed)
De Quervain's Tenosynovitis  De Quervain's tenosynovitis is a condition that causes inflammation of the tendon on the thumb side of the wrist. Tendons are cords of tissue that connect bones to muscles. The tendons in the hand pass through a tunnel called a sheath. A slippery layer of tissue (synovium) lets the tendons move smoothly in the sheath. With de Quervain's tenosynovitis, the sheath swells or thickens, causing friction and pain. The condition is also called de Quervain's disease and de Quervain's syndrome. It occurs most often in women who are 30-50 years old. What are the causes? The exact cause of this condition is not known. It may be associated with overuse of the hand and wrist. What increases the risk? You are more likely to develop this condition if you: Use your hands far more than normal, especially if you repeat certain movements that involve twisting your hand or using a tight grip. Are pregnant. Are a middle-aged woman. Have rheumatoid arthritis. Have diabetes. What are the signs or symptoms? The main symptom of this condition is pain on the thumb side of the wrist. The pain may get worse when you grasp something or turn your wrist. Other symptoms may include: Pain that extends up the forearm. Swelling of your wrist and hand. Trouble moving the thumb and wrist. A sensation of snapping in the wrist. A bump filled with fluid (cyst) in the area of the pain. How is this diagnosed? This condition may be diagnosed based on: Your symptoms and medical history. A physical exam. During the exam, your health care provider may do a simple test (Finkelstein test) that involves pulling your thumb and wrist to see if this causes pain. You may also need to have an X-ray or ultrasound. How is this treated? Treatment for this condition may include: Avoiding any activity that causes pain and swelling. Taking medicines. Anti-inflammatory medicines and corticosteroid injections may be used  to reduce inflammation and relieve pain. Wearing a splint. Having surgery. This may be needed if other treatments do not work. Once the pain and swelling have gone down, you may start: Physical therapy. This includes exercises to improve movement and strength in your wrist and thumb. Occupational therapy. This includes adjusting how you move your wrist. Follow these instructions at home: If you have a splint: Wear the splint as told by your health care provider. Remove it only as told by your health care provider. Loosen the splint if your fingers tingle, become numb, or turn cold and blue. Keep the splint clean. If the splint is not waterproof: Do not let it get wet. Cover it with a watertight covering when you take a bath or a shower. Managing pain, stiffness, and swelling  Avoid movements and activities that cause pain and swelling in the wrist area. If directed, put ice on the painful area. This may be helpful after doing activities that involve the sore wrist. To do this: Put ice in a plastic bag. Place a towel between your skin and the bag. Leave the ice on for 20 minutes, 2-3 times a day. Remove the ice if your skin turns bright red. This is very important. If you cannot feel pain, heat, or cold, you have a greater risk of damage to the area. Move your fingers often to reduce stiffness and swelling. Raise (elevate) the injured area above the level of your heart while you are sitting or lying down. General instructions Return to your normal activities as told by your health care provider. Ask your   health care provider what activities are safe for you. Take over-the-counter and prescription medicines only as told by your health care provider. Keep all follow-up visits. This is important. Contact a health care provider if: Your pain medicine does not help. Your pain gets worse. You develop new symptoms. Summary De Quervain's tenosynovitis is a condition that causes inflammation  of the tendon on the thumb side of the wrist. The condition occurs most often in women who are 30-50 years old. The exact cause of this condition is not known. It may be associated with overuse of the hand and wrist. Treatment starts with avoiding activity that causes pain or swelling in the wrist area. Other treatments may include wearing a splint and taking medicine. Sometimes, surgery is needed. This information is not intended to replace advice given to you by your health care provider. Make sure you discuss any questions you have with your health care provider. Document Revised: 08/07/2019 Document Reviewed: 08/07/2019 Elsevier Patient Education  2023 [employer]  

## 2022-04-27 NOTE — Progress Notes (Deleted)
Patient ID: Stephanie Juarez, female   DOB: [birth year], 71 y.o.   MRN: [id number]

## 2022-04-28 LAB — COMPREHENSIVE METABOLIC PANEL
ALT: 11 IU/L (ref 0–32)
AST: 16 IU/L (ref 0–40)
Albumin/Globulin Ratio: 1.3 (ref 1.2–2.2)
Albumin: 4.4 g/dL (ref 3.9–4.9)
Alkaline Phosphatase: 82 IU/L (ref 44–121)
BUN/Creatinine Ratio: 20 (ref 12–28)
BUN: 11 mg/dL (ref 8–27)
Bilirubin Total: 0.9 mg/dL (ref 0.0–1.2)
CO2: 23 mmol/L (ref 20–29)
Calcium: 9.5 mg/dL (ref 8.7–10.3)
Chloride: 104 mmol/L (ref 96–106)
Creatinine, Ser: 0.55 mg/dL — ABNORMAL LOW (ref 0.57–1.00)
Globulin, Total: 3.4 g/dL (ref 1.5–4.5)
Glucose: 89 mg/dL (ref 70–99)
Potassium: 3.7 mmol/L (ref 3.5–5.2)
Sodium: 141 mmol/L (ref 134–144)
Total Protein: 7.8 g/dL (ref 6.0–8.5)
eGFR: 99 mL/min/{1.73_m2} (ref >59)

## 2022-04-28 LAB — LIPID PANEL
Chol/HDL Ratio: 3 ratio (ref 0.0–4.4)
Cholesterol, Total: 212 mg/dL — ABNORMAL HIGH (ref 100–199)
HDL: 70 mg/dL (ref >39)
LDL Chol Calc (NIH): 127 mg/dL — ABNORMAL HIGH (ref 0–99)
Triglycerides: 87 mg/dL (ref 0–149)
VLDL Cholesterol Cal: 15 mg/dL (ref 5–40)

## 2022-04-28 LAB — HEMOGLOBIN A1C
Est. average glucose Bld gHb Est-mCnc: 103 mg/dL
Hgb A1c MFr Bld: 5.2 % (ref 4.8–5.6)

## 2022-05-06 ENCOUNTER — Other Ambulatory Visit: Payer: Self-pay | Admitting: Family Medicine

## 2022-05-06 DIAGNOSIS — M654 Radial styloid tenosynovitis [de Quervain]: Secondary | ICD-10-CM

## 2022-05-06 DIAGNOSIS — M1909 Primary osteoarthritis, other specified site: Secondary | ICD-10-CM

## 2022-05-09 ENCOUNTER — Other Ambulatory Visit: Payer: Self-pay | Admitting: Family Medicine

## 2022-05-09 DIAGNOSIS — I10 Essential (primary) hypertension: Secondary | ICD-10-CM

## 2022-05-09 DIAGNOSIS — F3176 Bipolar disorder, in full remission, most recent episode depressed: Secondary | ICD-10-CM

## 2022-05-09 DIAGNOSIS — E7800 Pure hypercholesterolemia, unspecified: Secondary | ICD-10-CM

## 2022-05-10 NOTE — Telephone Encounter (Signed)
Unable to refill per protocol, Rx request is too soon. Last refill 04/27/22 for 90 days and 1 refill. Will refuse duplicate request.  Requested Prescriptions  Pending Prescriptions Disp Refills   carvedilol (COREG) 3.125 MG tablet [Pharmacy Med Name: CARVEDILOL 3.125 MG Tablet] 60 tablet 3    Sig: TAKE 1 TABLET (3.125 MG TOTAL) BY MOUTH 2 (TWO) TIMES DAILY WITH A MEAL. OFFICE VISIT NEEDED FOR ADDITIONAL REFILLS     Cardiovascular: Beta Blockers 3 Failed - 05/09/2022  2:38 AM      Failed - Cr in normal range and within 360 days    Creat  Date Value Ref Range Status  03/15/2016 0.64 0.50 - 0.99 mg/dL Final    Comment:      For patients > or = 70 years of age: The upper reference limit for Creatinine is approximately 13% higher for people identified as African-American.      Creatinine, Ser  Date Value Ref Range Status  04/27/2022 0.55 (L) 0.57 - 1.00 mg/dL Final         Failed - Last BP in normal range    BP Readings from Last 1 Encounters:  04/27/22 (!) 151/77         Passed - AST in normal range and within 360 days    AST  Date Value Ref Range Status  04/27/2022 16 0 - 40 IU/L Final         Passed - ALT in normal range and within 360 days    ALT  Date Value Ref Range Status  04/27/2022 11 0 - 32 IU/L Final         Passed - Last Heart Rate in normal range    Pulse Readings from Last 1 Encounters:  04/27/22 71         Passed - Valid encounter within last 6 months    Recent Outpatient Visits           1 week ago Screening for diabetes mellitus   [facility] [place], [place], [place]   10 months ago Calculus of gallbladder without cholecystitis without obstruction   [facility] [place], Odette Horns, MD   1 year ago Encounter for screening mammogram for malignant neoplasm of breast   [facility] [place], [place], MD   2 years ago Adjustment disorder with mixed anxiety and depressed  mood   [facility] [place], Odette Horns, MD   2 years ago Dizziness   [facility] [place], Odette Horns, MD       Future Appointments             In 3 months Hoy Register, MD [facility]             FLUoxetine (PROZAC) 20 MG capsule [Pharmacy Med Name: FLUOXETINE HYDROCHLORIDE 20 MG Capsule] 90 capsule 3    Sig: TAKE 3 CAPSULES EVERY DAY (APPOINTMENT IS NEEDED FOR REFILLS)     Psychiatry:  Antidepressants - SSRI Passed - 05/09/2022  2:38 AM      Passed - Valid encounter within last 6 months    Recent Outpatient Visits           1 week ago Screening for diabetes mellitus   [facility] [place], [place], [place]   10 months ago Calculus of gallbladder without cholecystitis without obstruction   [facility] Charlott Rakes, MD   1 year ago Encounter for screening mammogram for malignant neoplasm of breast   [place], MD   2 years ago Adjustment disorder with mixed anxiety and depressed mood   [place], Enobong, MD   2 years ago [place], Enobong, MD       Future Appointments             In 3 months Charlott Rakes, MD [place]             amLODipine (NORVASC) 10 MG tablet [Pharmacy Med Name: AMLODIPINE BESYLATE 10 MG Tablet] 30 tablet 3    Sig: TAKE 1 TABLET (10 MG TOTAL) BY MOUTH DAILY. OFFICE VISIT NEEDED FOR ADDITIONAL REFILLS     Cardiovascular: Calcium Channel Blockers 2 Failed - 05/09/2022  2:38 AM      Failed - Last BP in normal range    BP Readings from Last 1 Encounters:  04/27/22 (!) 151/77         Passed - Last Heart Rate in normal range    Pulse Readings from Last 1 Encounters:  04/27/22 71         Passed - Valid encounter within last 6  months    Recent Outpatient Visits           1 week ago Screening for diabetes mellitus   [place], [place], [place]   10 months ago Calculus of gallbladder without cholecystitis without obstruction    [facility] Charlott Rakes, MD   1 year ago Encounter for screening mammogram for malignant neoplasm of breast   [place], MD   2 years ago Adjustment disorder with mixed anxiety and depressed mood   [place], Charlane Ferretti, MD   2 years ago [place], Enobong, MD       Future Appointments             In 3 months Charlott Rakes, MD [place]             atorvastatin (LIPITOR) 40 MG tablet [Pharmacy Med Name: ATORVASTATIN CALCIUM 40 MG Tablet] 30 tablet 3    Sig: TAKE 1 TABLET (40 MG TOTAL) BY MOUTH DAILY. OFFICE VISIT NEEDED FOR ADDITIONAL REFILLS     Cardiovascular:  Antilipid - Statins Failed - 05/09/2022  2:38 AM      Failed - Lipid Panel in normal range within the last 12 months    Cholesterol, Total  Date Value Ref Range Status  04/27/2022 212 (H) 100 - 199 mg/dL Final   LDL Chol Calc (NIH)  Date Value Ref Range Status  04/27/2022 127 (H) 0 - 99 mg/dL Final   HDL  Date Value Ref Range Status  04/27/2022 70 >39 mg/dL Final   Triglycerides  Date Value Ref Range Status  04/27/2022 87 0 - 149 mg/dL Final         Passed - Patient is not pregnant      Passed - Valid encounter within last 12 months    Recent Outpatient Visits           1 week ago Screening for diabetes mellitus   [place]  Freeman Caldron M, PA-C   10 months ago Calculus of gallbladder without cholecystitis without obstruction   [facility] Charlott Rakes, MD   1 year ago Encounter for screening  mammogram for malignant neoplasm of breast   [place], MD   2 years ago Adjustment disorder with mixed anxiety and depressed mood   [place], Charlane Ferretti, MD   2 years ago [place], Enobong, MD       Future Appointments             In 3 months Charlott Rakes, MD [place]

## 2022-07-09 ENCOUNTER — Other Ambulatory Visit: Payer: Self-pay | Admitting: Physician Assistant

## 2022-07-09 DIAGNOSIS — M1909 Primary osteoarthritis, other specified site: Secondary | ICD-10-CM

## 2022-07-09 DIAGNOSIS — M654 Radial styloid tenosynovitis [de Quervain]: Secondary | ICD-10-CM

## 2022-07-19 ENCOUNTER — Telehealth: Payer: Self-pay | Admitting: Family Medicine

## 2022-07-19 NOTE — Telephone Encounter (Signed)
Called patient to schedule Medicare Annual Wellness Visit (AWV). No voicemail available to leave a message.  Last date of AWV: 01/30/21   If any questions, please contact me at [phone].  Thank you ,  [place] [employer] AWV direct phone # [phone]

## 2022-08-08 ENCOUNTER — Encounter: Payer: Self-pay | Admitting: Family Medicine

## 2022-08-08 ENCOUNTER — Ambulatory Visit: Payer: Medicare HMO | Attending: Family Medicine | Admitting: Family Medicine

## 2022-08-08 VITALS — BP 154/79 | HR 68 | Ht 64.0 in | Wt 229.8 lb

## 2022-08-08 DIAGNOSIS — I10 Essential (primary) hypertension: Secondary | ICD-10-CM

## 2022-08-08 DIAGNOSIS — E7800 Pure hypercholesterolemia, unspecified: Secondary | ICD-10-CM

## 2022-08-08 DIAGNOSIS — R42 Dizziness and giddiness: Secondary | ICD-10-CM

## 2022-08-08 DIAGNOSIS — F319 Bipolar disorder, unspecified: Secondary | ICD-10-CM | POA: Diagnosis not present

## 2022-08-08 DIAGNOSIS — M1909 Primary osteoarthritis, other specified site: Secondary | ICD-10-CM

## 2022-08-08 MED ORDER — MECLIZINE HCL 25 MG PO TABS: 25.0000 mg | ORAL_TABLET | Freq: Two times a day (BID) | ORAL | 1 refills | Status: DC | PRN

## 2022-08-08 MED ORDER — NAPROXEN 500 MG PO TABS: 500.0000 mg | ORAL_TABLET | Freq: Two times a day (BID) | ORAL | 1 refills | Status: DC

## 2022-08-08 MED ORDER — ROSUVASTATIN CALCIUM 5 MG PO TABS: 5.0000 mg | ORAL_TABLET | Freq: Every day | ORAL | 1 refills | Status: DC

## 2022-08-08 MED ORDER — CARVEDILOL 6.25 MG PO TABS: 6.2500 mg | ORAL_TABLET | Freq: Two times a day (BID) | ORAL | 1 refills | Status: DC

## 2022-08-08 NOTE — Patient Instructions (Signed)
Exercising to Stay Healthy To become healthy and stay healthy, it is recommended that you do moderate-intensity and vigorous-intensity exercise. You can tell that you are exercising at a moderate intensity if your heart starts beating faster and you start breathing faster but can still hold a conversation. You can tell that you are exercising at a vigorous intensity if you are breathing much harder and faster and cannot hold a conversation while exercising. How can exercise benefit me? Exercising regularly is important. It has many health benefits, such as: Improving overall fitness, flexibility, and endurance. Increasing bone density. Helping with weight control. Decreasing body fat. Increasing muscle strength and endurance. Reducing stress and tension, anxiety, depression, or anger. Improving overall health. What guidelines should I follow while exercising? Before you start a new exercise program, talk with your health care provider. Do not exercise so much that you hurt yourself, feel dizzy, or get very short of breath. Wear comfortable clothes and wear shoes with good support. Drink plenty of water while you exercise to prevent dehydration or heat stroke. Work out until your breathing and your heartbeat get faster (moderate intensity). How often should I exercise? Choose an activity that you enjoy, and set realistic goals. Your health care provider can help you make an activity plan that is individually designed and works best for you. Exercise regularly as told by your health care provider. This may include: Doing strength training two times a week, such as: Lifting weights. Using resistance bands. Push-ups. Sit-ups. Yoga. Doing a certain intensity of exercise for a given amount of time. Choose from these options: A total of 150 minutes of moderate-intensity exercise every week. A total of 75 minutes of vigorous-intensity exercise every week. A mix of moderate-intensity and  vigorous-intensity exercise every week. Children, pregnant women, people who have not exercised regularly, people who are overweight, and older adults may need to talk with a health care provider about what activities are safe to perform. If you have a medical condition, be sure to talk with your health care provider before you start a new exercise program. What are some exercise ideas? Moderate-intensity exercise ideas include: Walking 1 mile (1.6 km) in about 15 minutes. Biking. Hiking. Golfing. Dancing. Water aerobics. Vigorous-intensity exercise ideas include: Walking 4.5 miles (7.2 km) or more in about 1 hour. Jogging or running 5 miles (8 km) in about 1 hour. Biking 10 miles (16.1 km) or more in about 1 hour. Lap swimming. Roller-skating or in-line skating. Cross-country skiing. Vigorous competitive sports, such as football, basketball, and soccer. Jumping rope. Aerobic dancing. What are some everyday activities that can help me get exercise? Yard work, such as: Pushing a lawn mower. Raking and bagging leaves. Washing your car. Pushing a stroller. Shoveling snow. Gardening. Washing windows or floors. How can I be more active in my day-to-day activities? Use stairs instead of an elevator. Take a walk during your lunch break. If you drive, park your car farther away from your work or school. If you take public transportation, get off one stop early and walk the rest of the way. Stand up or walk around during all of your indoor phone calls. Get up, stretch, and walk around every 30 minutes throughout the day. Enjoy exercise with a friend. Support to continue exercising will help you keep a regular routine of activity. Where to find more information You can find more information about exercising to stay healthy from: U.S. Department of Health and Human Services: www.hhs.gov Centers for Disease Control and Prevention (  CDC): www.cdc.gov Summary Exercising regularly is  important. It will improve your overall fitness, flexibility, and endurance. Regular exercise will also improve your overall health. It can help you control your weight, reduce stress, and improve your bone density. Do not exercise so much that you hurt yourself, feel dizzy, or get very short of breath. Before you start a new exercise program, talk with your health care provider. This information is not intended to replace advice given to you by your health care provider. Make sure you discuss any questions you have with your health care provider. Document Revised: 08/21/2020 Document Reviewed: 08/21/2020 Elsevier Patient Education  2023 Elsevier Inc.  

## 2022-08-08 NOTE — Progress Notes (Signed)
Discuss changing Meloxicam due to it not working anymore.

## 2022-08-08 NOTE — Progress Notes (Signed)
Subjective:  Patient ID: Stephanie Juarez, female    DOB: 1950-11-21  Age: 72 y.o. MRN: [id number]  CC: Hypertension   HPI Stephanie Juarez is a 72 y.o. year old female with a history of hypertension, hyperlipidemia, obesity, chronic left knee pain, bipolar disorder who presents today  for follow-up visit.   Interval History:  She Complains of Meloxicam not working any more.  Symptoms are better with Aleve.  She takes this for osteoarthritis of her knees. Complains of feeling stiff with Atorvastatin and sometimes she can hardly move. She takes it every other day due to stiffness.  Endorses adherence with her antihypertensive but also admits to eating a lot of pork over the weekend.  BP is elevated despite adherence with antihypertensive. She requests refill of meclizine which she takes for vertigo Past Medical History:  Diagnosis Date   Arthritis    Hypertension     Past Surgical History:  Procedure Laterality Date   MANDIBLE SURGERY Left 1981   impacted teeth   SHOULDER ARTHROSCOPY Left 2001   for cyst    Family History  Problem Relation Age of Onset   Arthritis Mother    Heart disease Mother    Heart disease Father     Social History   Socioeconomic History   Marital status: Single    Spouse name: Not on file   Number of children: Not on file   Years of education: Not on file   Highest education level: Not on file  Occupational History   Not on file  Tobacco Use   Smoking status: Former   Smokeless tobacco: Never  Substance and Sexual Activity   Alcohol use: No   Drug use: No   Sexual activity: Not on file  Other Topics Concern   Not on file  Social History Narrative   Not on file   Social Determinants of Health   Financial Resource Strain: Low Risk  (01/30/2021)   Overall Financial Resource Strain (CARDIA)    Difficulty of Paying Living Expenses: Not very hard  Food Insecurity: Food Insecurity Present (01/30/2021)   Hunger Vital Sign    Worried About  Running Out of Food in the Last Year: Sometimes true    Ran Out of Food in the Last Year: Never true  Transportation Needs: No Transportation Needs (01/30/2021)   PRAPARE - Hydrologist (Medical): No    Lack of Transportation (Non-Medical): No  Physical Activity: Inactive (01/30/2021)   Exercise Vital Sign    Days of Exercise per Week: 0 days    Minutes of Exercise per Session: 0 min  Stress: No Stress Concern Present (01/30/2021)   [place]    Feeling of Stress : Only a little  Social Connections: Moderately Integrated (01/30/2021)   Social Connection and Isolation Panel [NHANES]    Frequency of Communication with Friends and Family: More than three times a week    Frequency of Social Gatherings with Friends and Family: More than three times a week    Attends Religious Services: More than 4 times per year    Active Member of [employer] or Organizations: Yes    Attends Archivist Meetings: More than 4 times per year    Marital Status: Widowed    Allergies  Allergen Reactions   Tramadol Itching   Vicks Nyquil Cold & Flu Night [Dm-Apap-Cpm]     itch    Outpatient Medications Prior  to Visit  Medication Sig Dispense Refill   amLODipine (NORVASC) 10 MG tablet Take 1 tablet (10 mg total) by mouth daily. OFFICE VISIT NEEDED FOR ADDITIONAL REFILLS 90 tablet 1   buPROPion (WELLBUTRIN) 100 MG tablet Take 100 mg by mouth daily.     carvedilol (COREG) 3.125 MG tablet Take 1 tablet (3.125 mg total) by mouth 2 (two) times daily with a meal. OFFICE VISIT NEEDED FOR ADDITIONAL REFILLS 180 tablet 1   cetirizine (ZYRTEC) 10 MG tablet Take 1 tablet (10 mg total) by mouth daily. 30 tablet 1   diclofenac Sodium (VOLTAREN ARTHRITIS PAIN) 1 % GEL Apply 2 g topically 4 (four) times daily. 100 g 2   FLUoxetine (PROZAC) 20 MG capsule TAKE 3 CAPSULES EVERY DAY (APPOINTMENT IS NEEDED FOR REFILLS) 270 capsule 1    atorvastatin (LIPITOR) 40 MG tablet Take 1 tablet (40 mg total) by mouth daily. OFFICE VISIT NEEDED FOR ADDITIONAL REFILLS 90 tablet 1   meclizine (ANTIVERT) 25 MG tablet Take 1 tablet (25 mg total) by mouth 2 (two) times daily as needed for dizziness. 60 tablet 1   meloxicam (MOBIC) 7.5 MG tablet TAKE 1 TABLET EVERY DAY 90 tablet 0   HYDROcodone-acetaminophen (NORCO/VICODIN) 5-325 MG tablet Take 2 tablets by mouth every 4 (four) hours as needed. (Patient not taking: Reported on 04/27/2022) 20 tablet 0   ondansetron (ZOFRAN ODT) 4 MG disintegrating tablet Take 1 tablet (4 mg total) by mouth every 8 (eight) hours as needed for nausea or vomiting. (Patient not taking: Reported on 01/04/2017) 20 tablet 0   tetrahydrozoline 0.05 % ophthalmic solution Place 1 drop into both eyes daily as needed (dry eyes). (Patient not taking: Reported on 03/23/2020)     No facility-administered medications prior to visit.     ROS Review of Systems  Constitutional:  Negative for activity change and appetite change.  HENT:  Negative for sinus pressure and sore throat.   Respiratory:  Negative for chest tightness, shortness of breath and wheezing.   Cardiovascular:  Negative for chest pain and palpitations.  Gastrointestinal:  Negative for abdominal distention, abdominal pain and constipation.  Genitourinary: Negative.   Musculoskeletal:        See HPI  Psychiatric/Behavioral:  Negative for behavioral problems and dysphoric mood.     Objective:  BP (!) 154/79   Pulse 68   Ht 5\' 4"  (1.626 m)   Wt 229 lb 12.8 oz (104.2 kg)   SpO2 97%   BMI 39.45 kg/m      08/08/2022    9:50 AM 04/27/2022   10:17 AM 06/30/2021    9:30 AM  BP/Weight  Systolic BP [id number]  Diastolic BP 79 77 78  Wt. (Lbs) 229.8 225 227  BMI 39.45 kg/m2 38.62 kg/m2 38.96 kg/m2      Physical Exam Constitutional:      Appearance: She is well-developed.  Cardiovascular:     Rate and Rhythm: Normal rate.     Heart sounds: Normal  heart sounds. No murmur heard. Pulmonary:     Effort: Pulmonary effort is normal.     Breath sounds: Normal breath sounds. No wheezing or rales.  Chest:     Chest wall: No tenderness.  Abdominal:     General: Bowel sounds are normal. There is no distension.     Palpations: Abdomen is soft. There is no mass.     Tenderness: There is no abdominal tenderness.  Musculoskeletal:        General: Normal range  of motion.     Right lower leg: No edema.     Left lower leg: No edema.  Neurological:     Mental Status: She is alert and oriented to person, place, and time.  Psychiatric:        Mood and Affect: Mood normal.        Latest Ref Rng & Units 04/27/2022   11:02 AM 06/30/2021   10:17 AM 12/29/2020    8:58 AM  CMP  Glucose 70 - 99 mg/dL 89  104  89   BUN 8 - 27 mg/dL 11  15  17    Creatinine 0.57 - 1.00 mg/dL 0.55  0.69  0.71   Sodium 134 - 144 mmol/L 141  141  143   Potassium 3.5 - 5.2 mmol/L 3.7  3.6  3.6   Chloride 96 - 106 mmol/L 104  106  109   CO2 20 - 29 mmol/L 23  23  23    Calcium 8.7 - 10.3 mg/dL 9.5  9.7  9.0   Total Protein 6.0 - 8.5 g/dL 7.8  7.7  6.8   Total Bilirubin 0.0 - 1.2 mg/dL 0.9  0.8  0.6   Alkaline Phos 44 - 121 IU/L 82  74  69   AST 0 - 40 IU/L 16  18  17    ALT 0 - 32 IU/L 11  13  13      Lipid Panel     Component Value Date/Time   CHOL 212 (H) 04/27/2022 1102   TRIG 87 04/27/2022 1102   HDL 70 04/27/2022 1102   CHOLHDL 3.0 04/27/2022 1102   CHOLHDL 3.4 05/20/2015 0917   VLDL 13 05/20/2015 0917   LDLCALC 127 (H) 04/27/2022 1102    CBC    Component Value Date/Time   WBC 5.5 01/04/2017 1501   WBC 6.7 03/12/2016 0834   RBC 4.57 01/04/2017 1501   RBC 4.66 03/12/2016 0834   HGB 12.0 01/04/2017 1501   HCT 38.1 01/04/2017 1501   PLT 194 01/04/2017 1501   MCV 83 01/04/2017 1501   MCH 26.3 (L) 01/04/2017 1501   MCH 26.2 03/12/2016 0834   MCHC 31.5 01/04/2017 1501   MCHC 31.0 03/12/2016 0834   RDW 14.2 01/04/2017 1501   LYMPHSABS 2.1  01/04/2017 1501   MONOABS 0.5 07/29/2013 1806   EOSABS 0.3 01/04/2017 1501   BASOSABS 0.1 01/04/2017 1501    Lab Results  Component Value Date   HGBA1C 5.2 04/27/2022    Assessment & Plan:  1. Vertigo Stable - meclizine (ANTIVERT) 25 MG tablet; Take 1 tablet (25 mg total) by mouth 2 (two) times daily as needed for dizziness.  Dispense: 180 tablet; Refill: 1  2. Essential hypertension Uncontrolled Increase Coreg from 3.125 to 6.25 mg twice daily Continue amlodipine Counseled on blood pressure goal of less than 130/80, low-sodium, DASH diet, medication compliance, 150 minutes of moderate intensity exercise per week. Discussed medication compliance, adverse effects. - CMP14+EGFR - LP+Non-HDL Cholesterol - carvedilol (COREG) 6.25 MG tablet; Take 1 tablet (6.25 mg total) by mouth 2 (two) times daily with a meal.  Dispense: 180 tablet; Refill: 1  3. Pure hypercholesterolemia Uncontrolled Complains of arthralgias and stiffness with statin Will switch from atorvastatin to Crestor I will make no regimen change if her lipids are elevated given she has not been fully adherent with atorvastatin due to joint stiffness Low-cholesterol diet - rosuvastatin (CRESTOR) 5 MG tablet; Take 1 tablet (5 mg total) by mouth daily.  Dispense: 90  tablet; Refill: 1  4. Morbid obesity Continue to work on caloric restriction, exercise  5. Primary osteoarthritis of other site Uncontrolled on meloxicam It is possible that she is also having some form of statin myopathy Switch from meloxicam to naproxen Hopefully changing from atorvastatin to a lower dose of Crestor will provide some relief - naproxen (NAPROSYN) 500 MG tablet; Take 1 tablet (500 mg total) by mouth 2 (two) times daily with a meal.  Dispense: 180 tablet; Refill: 1    Meds ordered this encounter  Medications   rosuvastatin (CRESTOR) 5 MG tablet    Sig: Take 1 tablet (5 mg total) by mouth daily.    Dispense:  90 tablet    Refill:  1     Discontinue atorvastatin   naproxen (NAPROSYN) 500 MG tablet    Sig: Take 1 tablet (500 mg total) by mouth 2 (two) times daily with a meal.    Dispense:  180 tablet    Refill:  1    Discontinue meloxicam   meclizine (ANTIVERT) 25 MG tablet    Sig: Take 1 tablet (25 mg total) by mouth 2 (two) times daily as needed for dizziness.    Dispense:  180 tablet    Refill:  1    Follow-up: Return in about 6 months (around 02/07/2023) for Chronic medical conditions.       Charlott Rakes, MD, FAAFP. [facility] [place], [place]   08/08/2022, 10:15 AM

## 2022-08-09 LAB — CMP14+EGFR
ALT: 12 IU/L (ref 0–32)
AST: 17 IU/L (ref 0–40)
Albumin/Globulin Ratio: 1.2 (ref 1.2–2.2)
Albumin: 4.2 g/dL (ref 3.9–4.9)
Alkaline Phosphatase: 87 IU/L (ref 44–121)
BUN/Creatinine Ratio: 23 (ref 12–28)
BUN: 15 mg/dL (ref 8–27)
Bilirubin Total: 0.8 mg/dL (ref 0.0–1.2)
CO2: 23 mmol/L (ref 20–29)
Calcium: 9.4 mg/dL (ref 8.7–10.3)
Chloride: 106 mmol/L (ref 96–106)
Creatinine, Ser: 0.64 mg/dL (ref 0.57–1.00)
Globulin, Total: 3.5 g/dL (ref 1.5–4.5)
Glucose: 98 mg/dL (ref 70–99)
Potassium: 3.5 mmol/L (ref 3.5–5.2)
Sodium: 143 mmol/L (ref 134–144)
Total Protein: 7.7 g/dL (ref 6.0–8.5)
eGFR: 96 mL/min/{1.73_m2} (ref >59)

## 2022-08-09 LAB — LP+NON-HDL CHOLESTEROL
Cholesterol, Total: 166 mg/dL (ref 100–199)
HDL: 70 mg/dL (ref >39)
LDL Chol Calc (NIH): 82 mg/dL (ref 0–99)
Total Non-HDL-Chol (LDL+VLDL): 96 mg/dL (ref 0–129)
Triglycerides: 71 mg/dL (ref 0–149)
VLDL Cholesterol Cal: 14 mg/dL (ref 5–40)

## 2022-08-17 ENCOUNTER — Telehealth: Payer: Self-pay | Admitting: Family Medicine

## 2022-08-17 NOTE — Telephone Encounter (Signed)
Pt. Given lab results, verbalizes understanding. °

## 2022-08-24 ENCOUNTER — Ambulatory Visit: Payer: Medicare HMO | Attending: Family Medicine | Admitting: Pharmacist

## 2022-08-24 DIAGNOSIS — Z79899 Other long term (current) drug therapy: Secondary | ICD-10-CM

## 2022-08-24 NOTE — Progress Notes (Signed)
08/24/2022 Name: Stephanie Juarez MRN: [id number] DOB: Sep 09, 1950  No chief complaint on file.   Stephanie Juarez is a 72 y.o. year old female who presented for a telephone visit.   They were referred to the pharmacist by a quality report for assistance in managing hypertension.   Patient is participating in a Managed Medicaid Plan: no  Subjective:  Care Team: Primary Care Provider: Hoy Register, MD ; Next Scheduled Visit: 02/07/2023.  Medication Access/Adherence  Current Pharmacy:  [facility] - [place], [place] - [address] [place] [address] Phone: [phone] Fax: [phone]   Patient reports affordability concerns with their medications: No  Patient reports access/transportation concerns to their pharmacy: No  Patient reports adherence concerns with their medications:  No     Hypertension:  Current medications: amlodipine 10 mg daily, carvedilol 6.25 mg BID Medications previously tried: HCTZ (urinary incontinence), lisinopril (cough)   Patient does not have a validated, automated, upper arm home BP cuff Current blood pressure readings readings: none  Patient denies hypotensive s/sx including dizziness, lightheadedness.  Patient denies hypertensive symptoms including headache, chest pain, shortness of breath  Current meal patterns: compliant with salt restriction for the most part but is not 100% adherent to a low sodium diet. No excessive caffeine intake reported.  Current physical activity: none reported    Objective:  Lab Results  Component Value Date   HGBA1C 5.2 04/27/2022    Lab Results  Component Value Date   CREATININE 0.64 08/08/2022   BUN 15 08/08/2022   NA 143 08/08/2022   K 3.5 08/08/2022   CL 106 08/08/2022   CO2 23 08/08/2022    Lab Results  Component Value Date   CHOL 166 08/08/2022   HDL 70 08/08/2022   LDLCALC 82 08/08/2022   TRIG 71 08/08/2022   CHOLHDL 3.0 04/27/2022     Medications Reviewed Today     Reviewed by Hoy Register, MD (Physician) on 08/08/22 at 1108  Med List Status: <None>   Medication Order Taking? Sig Documenting Provider Last Dose Status Informant  amLODipine (NORVASC) 10 MG tablet [id number] Yes Take 1 tablet (10 mg total) by mouth daily. OFFICE VISIT NEEDED FOR ADDITIONAL REFILLS Bary Richard Taking Active   buPROPion [facility]) 100 MG tablet [id number] Yes Take 100 mg by mouth daily. [provider] Taking Active   carvedilol (COREG) 6.25 MG tablet [id number]  Take 1 tablet (6.25 mg total) by mouth 2 (two) times daily with a meal. Hoy Register, MD  Active   cetirizine (ZYRTEC) 10 MG tablet [id number] Yes Take 1 tablet (10 mg total) by mouth daily. Hoy Register, MD Taking Active   diclofenac Sodium (VOLTAREN ARTHRITIS PAIN) 1 % GEL [id number] Yes Apply 2 g topically 4 (four) times daily. Anders Simmonds, [place] Taking Active   FLUoxetine (PROZAC) 20 MG capsule [id number] Yes TAKE 3 CAPSULES EVERY DAY (APPOINTMENT IS NEEDED FOR REFILLS) Anders Simmonds, PA-C Taking Active    Patient not taking:   Discontinued 08/05/14 1627   meclizine (ANTIVERT) 25 MG tablet [id number]  Take 1 tablet (25 mg total) by mouth 2 (two) times daily as needed for dizziness. Hoy Register, MD  Active   naproxen (NAPROSYN) 500 MG tablet [id number] Yes Take 1 tablet (500 mg total) by mouth 2 (two) times daily with a meal. Hoy Register, MD  Active   rosuvastatin (CRESTOR) 5 MG tablet [id number] Yes Take 1 tablet (5 mg total) by mouth  daily. Hoy Register, MD  Active               Assessment/Plan:   Hypertension: - Currently uncontrolled. BP goal <130/80 mmHg. Medication adherence reported, however, BP has been elevated in clinic recently. Pain could be contributing. She appears to be mostly adherent to a good diet but is not exercising regular.  - Reviewed long term cardiovascular and renal outcomes of uncontrolled blood  pressure - Will schedule appointment with me in 1 month for BP check.    Follow Up Plan: 09/20/2022 with me for BP check.   [name], PharmD, [place]y [place], CPP Clinical Pharmacist [facility] [phone]

## 2022-09-01 ENCOUNTER — Telehealth: Payer: Self-pay | Admitting: Family Medicine

## 2022-09-01 NOTE — Telephone Encounter (Signed)
Contacted Stephanie Juarez to schedule their annual wellness visit. Appointment made for 09/08/22.  Stephanie Juarez AWV direct phone # [phone]

## 2022-09-08 ENCOUNTER — Ambulatory Visit: Payer: Medicare HMO | Attending: Family Medicine

## 2022-09-08 VITALS — Ht 64.0 in | Wt 224.0 lb

## 2022-09-08 DIAGNOSIS — Z0000 Encounter for general adult medical examination without abnormal findings: Secondary | ICD-10-CM | POA: Diagnosis not present

## 2022-09-08 NOTE — Progress Notes (Signed)
I connected with  [name] on 09/08/22 by a audio enabled telemedicine application and verified that I am speaking with the correct person using two identifiers.  Patient Location: Home  Provider Location: Office/Clinic  I discussed the limitations of evaluation and management by telemedicine. The patient expressed understanding and agreed to proceed.  Subjective:   Stephanie Juarez is a 72 y.o. female who presents for Medicare Annual (Subsequent) preventive examination.  Review of Systems     Cardiac Risk Factors include: advanced age (>69men, >48 women);dyslipidemia;hypertension;obesity (BMI >30kg/m2)     Objective:    Today's Vitals   09/08/22 0926  Weight: 224 lb (101.6 kg)  Height: 5\' 4"  (1.626 m)   Body mass index is 38.45 kg/m.     09/08/2022    9:34 AM 01/30/2021   11:20 AM 03/29/2018   10:27 AM 01/04/2017    2:21 PM 08/04/2016   11:16 AM 03/15/2016    2:50 PM 03/12/2016    8:32 AM  Advanced Directives  Does Patient Have a Medical Advance Directive? No No No No No No No  Would patient like information on creating a medical advance directive?  No - Patient declined No - Patient declined  No - Patient declined      Current Medications (verified) Outpatient Encounter Medications as of 09/08/2022  Medication Sig   amLODipine (NORVASC) 10 MG tablet Take 1 tablet (10 mg total) by mouth daily. OFFICE VISIT NEEDED FOR ADDITIONAL REFILLS   buPROPion (WELLBUTRIN) 100 MG tablet Take 100 mg by mouth daily.   carvedilol (COREG) 6.25 MG tablet Take 1 tablet (6.25 mg total) by mouth 2 (two) times daily with a meal.   cetirizine (ZYRTEC) 10 MG tablet Take 1 tablet (10 mg total) by mouth daily.   FLUoxetine (PROZAC) 20 MG capsule TAKE 3 CAPSULES EVERY DAY (APPOINTMENT IS NEEDED FOR REFILLS)   meclizine (ANTIVERT) 25 MG tablet Take 1 tablet (25 mg total) by mouth 2 (two) times daily as needed for dizziness.   naproxen (NAPROSYN) 500 MG tablet Take 1 tablet (500 mg total) by  mouth 2 (two) times daily with a meal.   rosuvastatin (CRESTOR) 5 MG tablet Take 1 tablet (5 mg total) by mouth daily.   diclofenac Sodium (VOLTAREN ARTHRITIS PAIN) 1 % GEL Apply 2 g topically 4 (four) times daily. (Patient not taking: Reported on 09/08/2022)   [DISCONTINUED] loratadine (CLARITIN) 10 MG tablet Take 1 tablet (10 mg total) by mouth daily. (Patient not taking: Reported on 06/26/2014)   No facility-administered encounter medications on file as of 09/08/2022.    Allergies (verified) Tramadol and Vicks nyquil cold & flu night [dm-apap-cpm]   History: Past Medical History:  Diagnosis Date   Arthritis    Hypertension    Past Surgical History:  Procedure Laterality Date   CHOLECYSTECTOMY     09/2021   MANDIBLE SURGERY Left 05/10/1979   impacted teeth   SHOULDER ARTHROSCOPY Left 05/10/1999   for cyst   Family History  Problem Relation Age of Onset   Arthritis Mother    Heart disease Mother    Heart disease Father    Social History   Socioeconomic History   Marital status: Single    Spouse name: Not on file   Number of children: Not on file   Years of education: Not on file   Highest education level: Not on file  Occupational History   Not on file  Tobacco Use   Smoking status: Former   Smokeless  tobacco: Never  Vaping Use   Vaping Use: Never used  Substance and Sexual Activity   Alcohol use: No   Drug use: No   Sexual activity: Not on file  Other Topics Concern   Not on file  Social History Narrative   Not on file   Social Determinants of Health   Financial Resource Strain: Low Risk  (09/08/2022)   Overall Financial Resource Strain (CARDIA)    Difficulty of Paying Living Expenses: Not hard at all  Food Insecurity: No Food Insecurity (09/08/2022)   Hunger Vital Sign    Worried About Running Out of Food in the Last Year: Never true    Ran Out of Food in the Last Year: Never true  Transportation Needs: No Transportation Needs (09/08/2022)   PRAPARE -  Administrator, Civil Service (Medical): No    Lack of Transportation (Non-Medical): No  Physical Activity: Inactive (09/08/2022)   Exercise Vital Sign    Days of Exercise per Week: 0 days    Minutes of Exercise per Session: 0 min  Stress: No Stress Concern Present (09/08/2022)   [employer] of Occupational Health - Occupational Stress Questionnaire    Feeling of Stress : Only a little  Social Connections: Moderately Integrated (01/30/2021)   Social Connection and Isolation Panel [NHANES]    Frequency of Communication with Friends and Family: More than three times a week    Frequency of Social Gatherings with Friends and Family: More than three times a week    Attends Religious Services: More than 4 times per year    Active Member of [employer] or Organizations: Yes    Attends Banker Meetings: More than 4 times per year    Marital Status: Widowed    Tobacco Counseling Counseling given: Not Answered   Clinical Intake:  Pre-visit preparation completed: Yes  Pain : No/denies pain     Nutritional Status: BMI > 30  Obese Nutritional Risks: Nausea/ vomitting/ diarrhea (diarrhea due to gallstone removal) Diabetes: No  How often do you need to have someone help you when you read instructions, pamphlets, or other written materials from your doctor or pharmacy?: 1 - Never  Diabetic? no  Interpreter Needed?: No  Information entered by :: NAllen LPN   Activities of Daily Living    09/08/2022    9:35 AM  In your present state of health, do you have any difficulty performing the following activities:  Hearing? 1  Comment has hearing aids  Vision? 0  Difficulty concentrating or making decisions? 1  Comment trouble with names  Walking or climbing stairs? 0  Dressing or bathing? 0  Doing errands, shopping? 0  Preparing Food and eating ? N  Using the Toilet? N  In the past six months, have you accidently leaked urine? Y  Do you have problems with loss of  bowel control? N  Managing your Medications? N  Managing your Finances? N  Housekeeping or managing your Housekeeping? N    Patient Care Team: Hoy Register, MD as PCP - General (Family Medicine) Serena Colonel, MD as Consulting Physician (Otolaryngology)  Indicate any recent Medical Services you may have received from other than [facility] providers in the past year (date may be approximate).     Assessment:   This is a routine wellness examination for [facility].  Hearing/Vision screen Vision Screening - Comments:: No regular eye exams  Dietary issues and exercise activities discussed: Current Exercise Habits: The patient does not participate in regular  exercise at present   Goals Addressed             This Visit's Progress    Patient Stated       09/08/2022, wants to lose weight       Depression Screen    09/08/2022    9:35 AM 08/08/2022    9:53 AM 04/27/2022   11:44 AM 06/30/2021   10:15 AM 01/30/2021   11:11 AM 12/28/2020    4:31 PM 08/28/2019    9:55 AM  PHQ 2/9 Scores  PHQ - 2 Score 0 0 0 1 2 0 2  PHQ- 9 Score  7 7 10 6 10 9     Fall Risk    09/08/2022    9:35 AM 06/30/2021    9:35 AM 01/30/2021   11:21 AM 12/28/2020    4:31 PM 03/23/2020    3:46 PM  Fall Risk   Falls in the past year? 0 0 1 0 0  Number falls in past yr: 0 0 0 0   Injury with Fall? 0 0 0 0   Risk for fall due to : Medication side effect      Follow up Falls prevention discussed;Education provided;Falls evaluation completed        FALL RISK PREVENTION PERTAINING TO THE HOME:  Any stairs in or around the home? No  If so, are there any without handrails? N/a Home free of loose throw rugs in walkways, pet beds, electrical cords, etc? Yes  Adequate lighting in your home to reduce risk of falls? Yes   ASSISTIVE DEVICES UTILIZED TO PREVENT FALLS:  Life alert? No  Use of a cane, walker or w/c? No  Grab bars in the bathroom? Yes  Shower chair or bench in shower? Yes  Elevated toilet seat or a  handicapped toilet? Yes   TIMED UP AND GO:  Was the test performed? No .      Cognitive Function:        09/08/2022    9:39 AM 01/30/2021   11:24 AM  6CIT Screen  What Year? 0 points 0 points  What month? 0 points 0 points  What time? 0 points 0 points  Count back from 20 0 points 0 points  Months in reverse 0 points 0 points  Repeat phrase 0 points 0 points  Total Score 0 points 0 points    Immunizations Immunization History  Administered Date(s) Administered   Influenza,inj,Quad PF,6+ Mos 06/26/2014, 02/05/2015, 03/29/2018, 04/27/2022   Janssen (J&J) SARS-COV-2 Vaccination 07/22/2019   Pneumococcal Conjugate-13 05/14/2019   Pneumococcal Polysaccharide-23 06/30/2021   Tdap 06/26/2014    TDAP status: Up to date  Flu Vaccine status: Up to date  Pneumococcal vaccine status: Up to date  Covid-19 vaccine status: Information provided on how to obtain vaccines.   Qualifies for Shingles Vaccine? Yes   Zostavax completed No   Shingrix Completed?: No.    Education has been provided regarding the importance of this vaccine. Patient has been advised to call insurance company to determine out of pocket expense if they have not yet received this vaccine. Advised may also receive vaccine at local pharmacy or Health Dept. Verbalized acceptance and understanding.  Screening Tests Health Maintenance  Topic Date Due   Zoster Vaccines- Shingrix (1 of 2) Never done   COVID-19 Vaccine (2 - Janssen risk series) 08/19/2019   Medicare Annual Wellness (AWV)  01/30/2022   INFLUENZA VACCINE  12/08/2022   MAMMOGRAM  01/03/2023   DTaP/Tdap/Td (2 -  Td or Tdap) 06/26/2024   COLONOSCOPY (Pts 45-68yrs Insurance coverage will need to be confirmed)  06/26/2024   Pneumonia Vaccine 81+ Years old  Completed   DEXA SCAN  Completed   Hepatitis C Screening  Completed   HPV VACCINES  Aged Out    Health Maintenance  Health Maintenance Due  Topic Date Due   Zoster Vaccines- Shingrix (1 of 2)  Never done   COVID-19 Vaccine (2 - Janssen risk series) 08/19/2019   Medicare Annual Wellness (AWV)  01/30/2022    Colorectal cancer screening: Type of screening: Colonoscopy. Completed 06/26/2014. Repeat every 10 years  Mammogram status: Completed 01/02/2021. Repeat every year  Bone Density status: Completed 06/22/2021.   Lung Cancer Screening: (Low Dose CT Chest recommended if Age 40-80 years, 30 pack-year currently smoking OR have quit w/in 15years.) does not qualify.   Lung Cancer Screening Referral: no  Additional Screening:  Hepatitis C Screening: does qualify; Completed 09/27/2017  Vision Screening: Recommended annual ophthalmology exams for early detection of glaucoma and other disorders of the eye. Is the patient up to date with their annual eye exam?  No  Who is the provider or what is the name of the office in which the patient attends annual eye exams? none If pt is not established with a provider, would they like to be referred to a provider to establish care? No .   Dental Screening: Recommended annual dental exams for proper oral hygiene  Community Resource Referral / Chronic Care Management: CRR required this visit?  No   CCM required this visit?  No      Plan:     I have personally reviewed and noted the following in the patient's chart:   Medical and social history Use of alcohol, tobacco or illicit drugs  Current medications and supplements including opioid prescriptions. Patient is not currently taking opioid prescriptions. Functional ability and status Nutritional status Physical activity Advanced directives List of other physicians Hospitalizations, surgeries, and ER visits in previous 12 months Vitals Screenings to include cognitive, depression, and falls Referrals and appointments  In addition, I have reviewed and discussed with patient certain preventive protocols, quality metrics, and best practice recommendations. A written personalized care  plan for preventive services as well as general preventive health recommendations were provided to patient.     Barb Merino, LPN   05/14/1094   Nurse Notes: none  Due to this being a virtual visit, the after visit summary with patients personalized plan was offered to patient via mail or my-chart.  to pick up at office at next visit

## 2022-09-08 NOTE — Patient Instructions (Signed)
Stephanie Juarez , Thank you for taking time to come for your Medicare Wellness Visit. I appreciate your ongoing commitment to your health goals. Please review the following plan we discussed and let me know if I can assist you in the future.   These are the goals we discussed:  Goals      Patient Stated     09/08/2022, wants to lose weight        This is a list of the screening recommended for you and due dates:  Health Maintenance  Topic Date Due   Zoster (Shingles) Vaccine (1 of 2) Never done   COVID-19 Vaccine (2 - Janssen risk series) 08/19/2019   Flu Shot  12/08/2022   Mammogram  01/03/2023   Medicare Annual Wellness Visit  09/08/2023   DTaP/Tdap/Td vaccine (2 - Td or Tdap) 06/26/2024   Colon Cancer Screening  06/26/2024   Pneumonia Vaccine  Completed   DEXA scan (bone density measurement)  Completed   Hepatitis C Screening: USPSTF Recommendation to screen - Ages 27-79 yo.  Completed   HPV Vaccine  Aged Out    Advanced directives: Advance directive discussed with you today.   Conditions/risks identified: none  Next appointment: Follow up in one year for your annual wellness visit    Preventive Care 65 Years and Older, Female Preventive care refers to lifestyle choices and visits with your health care provider that can promote health and wellness. What does preventive care include? A yearly physical exam. This is also called an annual well check. Dental exams once or twice a year. Routine eye exams. Ask your health care provider how often you should have your eyes checked. Personal lifestyle choices, including: Daily care of your teeth and gums. Regular physical activity. Eating a healthy diet. Avoiding tobacco and drug use. Limiting alcohol use. Practicing safe sex. Taking low-dose aspirin every day. Taking vitamin and mineral supplements as recommended by your health care provider. What happens during an annual well check? The services and screenings done by your  health care provider during your annual well check will depend on your age, overall health, lifestyle risk factors, and family history of disease. Counseling  Your health care provider may ask you questions about your: Alcohol use. Tobacco use. Drug use. Emotional well-being. Home and relationship well-being. Sexual activity. Eating habits. History of falls. Memory and ability to understand (cognition). Work and work Astronomer. Reproductive health. Screening  You may have the following tests or measurements: Height, weight, and BMI. Blood pressure. Lipid and cholesterol levels. These may be checked every 5 years, or more frequently if you are over 103 years old. Skin check. Lung cancer screening. You may have this screening every year starting at age 66 if you have a 30-pack-year history of smoking and currently smoke or have quit within the past 15 years. Fecal occult blood test (FOBT) of the stool. You may have this test every year starting at age 9. Flexible sigmoidoscopy or colonoscopy. You may have a sigmoidoscopy every 5 years or a colonoscopy every 10 years starting at age 70. Hepatitis C blood test. Hepatitis B blood test. Sexually transmitted disease (STD) testing. Diabetes screening. This is done by checking your blood sugar (glucose) after you have not eaten for a while (fasting). You may have this done every 1-3 years. Bone density scan. This is done to screen for osteoporosis. You may have this done starting at age 52. Mammogram. This may be done every 1-2 years. Talk to your health care  provider about how often you should have regular mammograms. Talk with your health care provider about your test results, treatment options, and if necessary, the need for more tests. Vaccines  Your health care provider may recommend certain vaccines, such as: Influenza vaccine. This is recommended every year. Tetanus, diphtheria, and acellular pertussis (Tdap, Td) vaccine. You may  need a Td booster every 10 years. Zoster vaccine. You may need this after age 40. Pneumococcal 13-valent conjugate (PCV13) vaccine. One dose is recommended after age 87. Pneumococcal polysaccharide (PPSV23) vaccine. One dose is recommended after age 13. Talk to your health care provider about which screenings and vaccines you need and how often you need them. This information is not intended to replace advice given to you by your health care provider. Make sure you discuss any questions you have with your health care provider. Document Released: 05/22/2015 Document Revised: 01/13/2016 Document Reviewed: 02/24/2015 Elsevier Interactive Patient Education  2017 [place] Prevention in the Home Falls can cause injuries. They can happen to people of all ages. There are many things you can do to make your home safe and to help prevent falls. What can I do on the outside of my home? Regularly fix the edges of walkways and driveways and fix any cracks. Remove anything that might make you trip as you walk through a door, such as a raised step or threshold. Trim any bushes or trees on the path to your home. Use bright outdoor lighting. Clear any walking paths of anything that might make someone trip, such as rocks or tools. Regularly check to see if handrails are loose or broken. Make sure that both sides of any steps have handrails. Any raised decks and porches should have guardrails on the edges. Have any leaves, snow, or ice cleared regularly. Use sand or salt on walking paths during winter. Clean up any spills in your garage right away. This includes oil or grease spills. What can I do in the bathroom? Use night lights. Install grab bars by the toilet and in the tub and shower. Do not use towel bars as grab bars. Use non-skid mats or decals in the tub or shower. If you need to sit down in the shower, use a plastic, non-slip stool. Keep the floor dry. Clean up any water that spills on  the floor as soon as it happens. Remove soap buildup in the tub or shower regularly. Attach bath mats securely with double-sided non-slip rug tape. Do not have throw rugs and other things on the floor that can make you trip. What can I do in the bedroom? Use night lights. Make sure that you have a light by your bed that is easy to reach. Do not use any sheets or blankets that are too big for your bed. They should not hang down onto the floor. Have a firm chair that has side arms. You can use this for support while you get dressed. Do not have throw rugs and other things on the floor that can make you trip. What can I do in the kitchen? Clean up any spills right away. Avoid walking on wet floors. Keep items that you use a lot in easy-to-reach places. If you need to reach something above you, use a strong step stool that has a grab bar. Keep electrical cords out of the way. Do not use floor polish or wax that makes floors slippery. If you must use wax, use non-skid floor wax. Do not have throw rugs  and other things on the floor that can make you trip. What can I do with my stairs? Do not leave any items on the stairs. Make sure that there are handrails on both sides of the stairs and use them. Fix handrails that are broken or loose. Make sure that handrails are as long as the stairways. Check any carpeting to make sure that it is firmly attached to the stairs. Fix any carpet that is loose or worn. Avoid having throw rugs at the top or bottom of the stairs. If you do have throw rugs, attach them to the floor with carpet tape. Make sure that you have a light switch at the top of the stairs and the bottom of the stairs. If you do not have them, ask someone to add them for you. What else can I do to help prevent falls? Wear shoes that: Do not have high heels. Have rubber bottoms. Are comfortable and fit you well. Are closed at the toe. Do not wear sandals. If you use a stepladder: Make sure  that it is fully opened. Do not climb a closed stepladder. Make sure that both sides of the stepladder are locked into place. Ask someone to hold it for you, if possible. Clearly mark and make sure that you can see: Any grab bars or handrails. First and last steps. Where the edge of each step is. Use tools that help you move around (mobility aids) if they are needed. These include: Canes. Walkers. Scooters. Crutches. Turn on the lights when you go into a dark area. Replace any light bulbs as soon as they burn out. Set up your furniture so you have a clear path. Avoid moving your furniture around. If any of your floors are uneven, fix them. If there are any pets around you, be aware of where they are. Review your medicines with your doctor. Some medicines can make you feel dizzy. This can increase your chance of falling. Ask your doctor what other things that you can do to help prevent falls. This information is not intended to replace advice given to you by your health care provider. Make sure you discuss any questions you have with your health care provider. Document Released: 02/19/2009 Document Revised: 10/01/2015 Document Reviewed: 05/30/2014 Elsevier Interactive Patient Education  2017 [employer].

## 2022-09-20 ENCOUNTER — Ambulatory Visit: Payer: Medicare HMO | Attending: Family Medicine | Admitting: Pharmacist

## 2022-09-20 ENCOUNTER — Encounter: Payer: Self-pay | Admitting: Pharmacist

## 2022-09-20 VITALS — BP 129/75 | HR 69

## 2022-09-20 DIAGNOSIS — E7800 Pure hypercholesterolemia, unspecified: Secondary | ICD-10-CM

## 2022-09-20 DIAGNOSIS — I10 Essential (primary) hypertension: Secondary | ICD-10-CM

## 2022-09-20 MED ORDER — CARVEDILOL 6.25 MG PO TABS
6.2500 mg | ORAL_TABLET | Freq: Two times a day (BID) | ORAL | 2 refills | Status: AC
Start: 2022-09-20 — End: ?

## 2022-09-20 MED ORDER — AMLODIPINE BESYLATE 10 MG PO TABS: 10.0000 mg | ORAL_TABLET | Freq: Every day | ORAL | 2 refills | Status: DC

## 2022-09-20 MED ORDER — ROSUVASTATIN CALCIUM 5 MG PO TABS
5.0000 mg | ORAL_TABLET | Freq: Every day | ORAL | 2 refills | Status: AC
Start: 2022-09-20 — End: ?

## 2022-09-20 NOTE — Progress Notes (Signed)
S:     No chief complaint on file.  72 y.o. female who presents for hypertension evaluation, education, and management. PMH is significant for HTN, obesity, hyperlipidemia. Patient was referred and last seen by Primary Care Provider, Dr. Alvis Lemmings, on 08/08/2022. At that visit, BP was 154/79 mmHg. Dr. Alvis Lemmings increased carvedilol dose.   I saw her 08/24/2022 via telemedicine. She was identified on a quality metric support to be high risk for failing CBP measures this year. I discussed the importance of medication adherence and had her schedule with me for a BP check.   Today, patient arrives in good  spirits and presents with her spouse. Denies dizziness, headache, blurred vision, swelling.   Patient reports hypertension is longstanding.   Family/Social history:  Fhx: heart disease Tobacco: former smoker  Alcohol: none reported   Medication adherence reported. Patient has taken BP medications today.   Current antihypertensives include: amlodipine 10 mg daily, carvedilol 6.25 mg BID  Reported home BP readings:  -BP: cannot recall readings -Has not checked in ~1 month  Patient reported dietary habits:  -Sodium: admits to eating salty snacks like potato chips daily  -Caffeine: drinks sodas daily   Patient-reported exercise habits:  -Walks at her job -Nothing outside of her job  O:  Vitals:   09/20/22 1025  BP: 129/75  Pulse: 69    Last 3 Office BP readings: BP Readings from Last 3 Encounters:  09/20/22 129/75  08/08/22 (!) 154/79  04/27/22 (!) 151/77    BMET    Component Value Date/Time   NA 143 08/08/2022 1028   K 3.5 08/08/2022 1028   CL 106 08/08/2022 1028   CO2 23 08/08/2022 1028   GLUCOSE 98 08/08/2022 1028   GLUCOSE 107 (H) 03/15/2016 1527   BUN 15 08/08/2022 1028   CREATININE 0.64 08/08/2022 1028   CREATININE 0.64 03/15/2016 1527   CALCIUM 9.4 08/08/2022 1028   GFRNONAA 65 03/23/2020 1630   GFRAA 75 03/23/2020 1630    Renal function: CrCl cannot be  calculated (Patient's most recent lab result is older than the maximum 21 days allowed.).  Clinical ASCVD: No  The 10-year ASCVD risk score (Arnett DK, et al., 2019) is: 9.8%   Values used to calculate the score:     Age: 67 years     Sex: Female     Is Non-Hispanic African American: Yes     Diabetic: No     Tobacco smoker: No     Systolic Blood Pressure: 129 mmHg     Is BP treated: Yes     HDL Cholesterol: 70 mg/dL     Total Cholesterol: 166 mg/dL  Patient is participating in a Managed Medicaid Plan: no   A/P: Hypertension diagnosed currently at [facility] on current medications. BP goal < 130/80 mmHg. Medication adherence appears appropriate.  -Continued current regimen.  -Patient educated on purpose, proper use, and potential adverse effects of amlodipine, carvedilol.  -F/u labs ordered - none today -Counseled on lifestyle modifications for blood pressure control including reduced dietary sodium, increased exercise, adequate sleep. -Encouraged patient to check BP at home and bring log of readings to next visit. Counseled on proper use of home BP cuff.   Results reviewed and written information provided.    Written patient instructions provided. Patient verbalized understanding of treatment plan.  Total time in face to face counseling 20 minutes.    Follow-up:  Pharmacist prn. PCP clinic visit in October.   [name], PharmD, BCACP, CPP  [place] [phone]

## 2022-10-05 ENCOUNTER — Other Ambulatory Visit: Payer: Self-pay | Admitting: Physician Assistant

## 2022-10-05 DIAGNOSIS — F3176 Bipolar disorder, in full remission, most recent episode depressed: Secondary | ICD-10-CM

## 2022-10-05 DIAGNOSIS — I10 Essential (primary) hypertension: Secondary | ICD-10-CM

## 2023-02-07 ENCOUNTER — Ambulatory Visit: Payer: Medicare HMO | Attending: Family Medicine | Admitting: Family Medicine

## 2023-02-07 ENCOUNTER — Encounter: Payer: Self-pay | Admitting: Family Medicine

## 2023-02-07 VITALS — BP 144/79 | HR 72 | Ht 64.0 in | Wt 240.2 lb

## 2023-02-07 DIAGNOSIS — E7800 Pure hypercholesterolemia, unspecified: Secondary | ICD-10-CM

## 2023-02-07 DIAGNOSIS — N3281 Overactive bladder: Secondary | ICD-10-CM | POA: Diagnosis not present

## 2023-02-07 DIAGNOSIS — Z131 Encounter for screening for diabetes mellitus: Secondary | ICD-10-CM

## 2023-02-07 DIAGNOSIS — I10 Essential (primary) hypertension: Secondary | ICD-10-CM | POA: Diagnosis not present

## 2023-02-07 DIAGNOSIS — Z23 Encounter for immunization: Secondary | ICD-10-CM | POA: Diagnosis not present

## 2023-02-07 MED ORDER — AMLODIPINE BESYLATE 10 MG PO TABS: 10.0000 mg | ORAL_TABLET | Freq: Every day | ORAL | 1 refills | Status: DC

## 2023-02-07 MED ORDER — CARVEDILOL 6.25 MG PO TABS
6.2500 mg | ORAL_TABLET | Freq: Two times a day (BID) | ORAL | 1 refills | Status: DC
Start: 2023-02-07 — End: 2023-07-27

## 2023-02-07 MED ORDER — ROSUVASTATIN CALCIUM 5 MG PO TABS: 5.0000 mg | ORAL_TABLET | Freq: Every day | ORAL | 1 refills | Status: DC

## 2023-02-07 MED ORDER — SOLIFENACIN SUCCINATE 5 MG PO TABS
5.0000 mg | ORAL_TABLET | Freq: Every day | ORAL | 1 refills | Status: DC
Start: 2023-02-07 — End: 2023-11-07

## 2023-02-07 NOTE — Progress Notes (Signed)
Subjective:  Patient ID: Stephanie Juarez, female    DOB: 01-Oct-1950  Age: 72 y.o. MRN: [id number]  CC: Medical Management of Chronic Issues   HPI Stephanie Juarez is a 3 y.o. year old female with a history of hypertension, hyperlipidemia, obesity, chronic left knee pain, bipolar disorder who presents today  for follow-up visit.   Interval History: Discussed the use of AI scribe software for clinical note transcription with the patient, who gave verbal consent to proceed.  She presents with concerns about her blood pressure medication regimen. She admits to inconsistently taking her prescribed amlodipine and carvedilol, skipping doses for two to three days at a time due to fear of side effects. She expresses particular concern about lisinopril, a medication she was previously prescribed, due to negative experiences of family members ever she is not currently on lisinopril.  The patient also reports increased urinary frequency, which she attributes to her blood pressure medication. This symptom has impacted her ability to work, as she frequently needs to use the restroom. She expresses interest in medication to help manage this symptom.        Past Medical History:  Diagnosis Date   Arthritis    Hypertension     Past Surgical History:  Procedure Laterality Date   CHOLECYSTECTOMY     09/2021   MANDIBLE SURGERY Left 05/10/1979   impacted teeth   SHOULDER ARTHROSCOPY Left 05/10/1999   for cyst    Family History  Problem Relation Age of Onset   Arthritis Mother    Heart disease Mother    Heart disease Father     Social History   Socioeconomic History   Marital status: Single    Spouse name: Not on file   Number of children: Not on file   Years of education: Not on file   Highest education level: Not on file  Occupational History   Not on file  Tobacco Use   Smoking status: Former   Smokeless tobacco: Never  Vaping Use   Vaping status: Never Used  Substance and  Sexual Activity   Alcohol use: No   Drug use: No   Sexual activity: Not on file  Other Topics Concern   Not on file  Social History Narrative   Not on file   Social Determinants of Health   Financial Resource Strain: Low Risk  (09/08/2022)   Overall Financial Resource Strain (CARDIA)    Difficulty of Paying Living Expenses: Not hard at all  Food Insecurity: No Food Insecurity (09/08/2022)   Hunger Vital Sign    Worried About Running Out of Food in the Last Year: Never true    Ran Out of Food in the Last Year: Never true  Transportation Needs: No Transportation Needs (09/08/2022)   PRAPARE - Administrator, Civil Service (Medical): No    Lack of Transportation (Non-Medical): No  Physical Activity: Inactive (09/08/2022)   Exercise Vital Sign    Days of Exercise per Week: 0 days    Minutes of Exercise per Session: 0 min  Stress: No Stress Concern Present (09/08/2022)   [employer] of Occupational Health - Occupational Stress Questionnaire    Feeling of Stress : Only a little  Social Connections: Moderately Integrated (01/30/2021)   Social Connection and Isolation Panel [NHANES]    Frequency of Communication with Friends and Family: More than three times a week    Frequency of Social Gatherings with Friends and Family: More than three times a week  Attends Religious Services: More than 4 times per year    Active Member of Clubs or Organizations: Yes    Attends Banker Meetings: More than 4 times per year    Marital Status: Widowed    Allergies  Allergen Reactions   Tramadol Itching   Vicks Nyquil Cold & Flu Night [Dm-Apap-Cpm]     itch    Outpatient Medications Prior to Visit  Medication Sig Dispense Refill   buPROPion (WELLBUTRIN) 100 MG tablet Take 100 mg by mouth daily.     cetirizine (ZYRTEC) 10 MG tablet Take 1 tablet (10 mg total) by mouth daily. 30 tablet 1   diclofenac Sodium (VOLTAREN ARTHRITIS PAIN) 1 % GEL Apply 2 g topically 4 (four)  times daily. 100 g 2   FLUoxetine (PROZAC) 20 MG capsule Take 3 capsules (60 mg total) by mouth daily. 270 capsule 1   meclizine (ANTIVERT) 25 MG tablet Take 1 tablet (25 mg total) by mouth 2 (two) times daily as needed for dizziness. 180 tablet 1   naproxen (NAPROSYN) 500 MG tablet Take 1 tablet (500 mg total) by mouth 2 (two) times daily with a meal. 180 tablet 1   amLODipine (NORVASC) 10 MG tablet Take 1 tablet (10 mg total) by mouth daily. 90 tablet 2   carvedilol (COREG) 6.25 MG tablet Take 1 tablet (6.25 mg total) by mouth 2 (two) times daily with a meal. 180 tablet 2   rosuvastatin (CRESTOR) 5 MG tablet Take 1 tablet (5 mg total) by mouth daily. 90 tablet 2   No facility-administered medications prior to visit.     ROS Review of Systems  Constitutional:  Negative for activity change and appetite change.  HENT:  Negative for sinus pressure and sore throat.   Respiratory:  Negative for chest tightness, shortness of breath and wheezing.   Cardiovascular:  Negative for chest pain and palpitations.  Gastrointestinal:  Negative for abdominal distention, abdominal pain and constipation.  Genitourinary: Negative.   Musculoskeletal: Negative.   Psychiatric/Behavioral:  Negative for behavioral problems and dysphoric mood.     Objective:  BP (!) 144/79   Pulse 72   Ht 5\' 4"  (1.626 m)   Wt 240 lb 3.2 oz (109 kg)   SpO2 96%   BMI 41.23 kg/m      02/07/2023    9:35 AM 02/07/2023    9:16 AM 09/20/2022   10:25 AM  BP/Weight  Systolic BP 144 149 129  Diastolic BP 79 79 75  Wt. (Lbs)  240.2   BMI  41.23 kg/m2       Physical Exam Constitutional:      Appearance: She is well-developed.  Cardiovascular:     Rate and Rhythm: Normal rate.     Heart sounds: Normal heart sounds. No murmur heard. Pulmonary:     Effort: Pulmonary effort is normal.     Breath sounds: Normal breath sounds. No wheezing or rales.  Chest:     Chest wall: No tenderness.  Abdominal:     General: Bowel  sounds are normal. There is no distension.     Palpations: Abdomen is soft. There is no mass.     Tenderness: There is no abdominal tenderness.  Musculoskeletal:        General: Normal range of motion.     Right lower leg: No edema.     Left lower leg: No edema.  Neurological:     Mental Status: She is alert and oriented to person, place, and  time.  Psychiatric:        Mood and Affect: Mood normal.        Latest Ref Rng & Units 08/08/2022   10:28 AM 04/27/2022   11:02 AM 06/30/2021   10:17 AM  CMP  Glucose 70 - 99 mg/dL 98  89  401   BUN 8 - 27 mg/dL 15  11  15    Creatinine 0.57 - 1.00 mg/dL 0.27  2.53  6.64   Sodium 134 - 144 mmol/L 143  141  141   Potassium 3.5 - 5.2 mmol/L 3.5  3.7  3.6   Chloride 96 - 106 mmol/L 106  104  106   CO2 20 - 29 mmol/L 23  23  23    Calcium 8.7 - 10.3 mg/dL 9.4  9.5  9.7   Total Protein 6.0 - 8.5 g/dL 7.7  7.8  7.7   Total Bilirubin 0.0 - 1.2 mg/dL 0.8  0.9  0.8   Alkaline Phos 44 - 121 IU/L 87  82  74   AST 0 - 40 IU/L 17  16  18    ALT 0 - 32 IU/L 12  11  13      Lipid Panel     Component Value Date/Time   CHOL 166 08/08/2022 1028   TRIG 71 08/08/2022 1028   HDL 70 08/08/2022 1028   CHOLHDL 3.0 04/27/2022 1102   CHOLHDL 3.4 05/20/2015 0917   VLDL 13 05/20/2015 0917   LDLCALC 82 08/08/2022 1028    CBC    Component Value Date/Time   WBC 5.5 01/04/2017 1501   WBC 6.7 03/12/2016 0834   RBC 4.57 01/04/2017 1501   RBC 4.66 03/12/2016 0834   HGB 12.0 01/04/2017 1501   HCT 38.1 01/04/2017 1501   PLT 194 01/04/2017 1501   MCV 83 01/04/2017 1501   MCH 26.3 (L) 01/04/2017 1501   MCH 26.2 03/12/2016 0834   MCHC 31.5 01/04/2017 1501   MCHC 31.0 03/12/2016 0834   RDW 14.2 01/04/2017 1501   LYMPHSABS 2.1 01/04/2017 1501   MONOABS 0.5 07/29/2013 1806   EOSABS 0.3 01/04/2017 1501   BASOSABS 0.1 01/04/2017 1501    Lab Results  Component Value Date   HGBA1C 5.2 04/27/2022    Assessment & Plan:      Hypertension Patient reports  inconsistent use of antihypertensive medications (amlodipine and carvedilol) due to fear of side effects and frequent urination. Discussed the importance of consistent medication use to prevent blood pressure fluctuations and potential stroke risk. -Continue amlodipine and carvedilol as prescribed. -Encouraged consistent daily use of both medications. -Counseled on blood pressure goal of less than 130/80, low-sodium, DASH diet, medication compliance, 150 minutes of moderate intensity exercise per week. Discussed medication compliance, adverse effects.   Overactive Bladder Patient reports frequent urination, particularly when taking antihypertensive medications, which is impacting her daily activities and work. Discussed the possibility of overactive bladder and the benefits of medication to manage symptoms. -Prescribe Vesicare for overactive bladder. -Review effectiveness at next visit.  Pure hypercholesterolemia Controlled Continue Lipitor -Low-cholesterol diet  General Health Maintenance -Order blood tests including diabetes screening. -Review results with patient when available.          Meds ordered this encounter  Medications   amLODipine (NORVASC) 10 MG tablet    Sig: Take 1 tablet (10 mg total) by mouth daily.    Dispense:  90 tablet    Refill:  1   carvedilol (COREG) 6.25 MG tablet    Sig:  Take 1 tablet (6.25 mg total) by mouth 2 (two) times daily with a meal.    Dispense:  180 tablet    Refill:  1   rosuvastatin (CRESTOR) 5 MG tablet    Sig: Take 1 tablet (5 mg total) by mouth daily.    Dispense:  90 tablet    Refill:  1   solifenacin (VESICARE) 5 MG tablet    Sig: Take 1 tablet (5 mg total) by mouth daily.    Dispense:  90 tablet    Refill:  1    Follow-up: Return in about 6 months (around 08/08/2023) for Chronic medical conditions.       Hoy Register, MD, FAAFP. [facility] [place], [place] [phone]   02/07/2023,  9:44 AM

## 2023-02-07 NOTE — Patient Instructions (Signed)

## 2023-02-08 LAB — CMP14+EGFR
ALT: 11 [IU]/L (ref 0–32)
AST: 16 [IU]/L (ref 0–40)
Albumin: 4.4 g/dL (ref 3.9–4.9)
Alkaline Phosphatase: 83 [IU]/L (ref 44–121)
BUN/Creatinine Ratio: 16 (ref 12–28)
BUN: 10 mg/dL (ref 8–27)
Bilirubin Total: 0.8 mg/dL (ref 0.0–1.2)
CO2: 24 mmol/L (ref 20–29)
Calcium: 9.3 mg/dL (ref 8.7–10.3)
Chloride: 102 mmol/L (ref 96–106)
Creatinine, Ser: 0.64 mg/dL (ref 0.57–1.00)
Globulin, Total: 3.2 g/dL (ref 1.5–4.5)
Glucose: 102 mg/dL — ABNORMAL HIGH (ref 70–99)
Potassium: 3.8 mmol/L (ref 3.5–5.2)
Sodium: 140 mmol/L (ref 134–144)
Total Protein: 7.6 g/dL (ref 6.0–8.5)
eGFR: 96 mL/min/{1.73_m2} (ref >59)

## 2023-02-08 LAB — LP+NON-HDL CHOLESTEROL
Cholesterol, Total: 213 mg/dL — ABNORMAL HIGH (ref 100–199)
HDL: 62 mg/dL (ref >39)
LDL Chol Calc (NIH): 135 mg/dL — ABNORMAL HIGH (ref 0–99)
Total Non-HDL-Chol (LDL+VLDL): 151 mg/dL — ABNORMAL HIGH (ref 0–129)
Triglycerides: 91 mg/dL (ref 0–149)
VLDL Cholesterol Cal: 16 mg/dL (ref 5–40)

## 2023-02-08 LAB — HEMOGLOBIN A1C
Est. average glucose Bld gHb Est-mCnc: 103 mg/dL
Hgb A1c MFr Bld: 5.2 % (ref 4.8–5.6)

## 2023-02-17 ENCOUNTER — Telehealth: Payer: Self-pay | Admitting: Family Medicine

## 2023-02-17 NOTE — Telephone Encounter (Signed)
Given lab results and instructions, verbalizes understanding.

## 2023-02-19 IMAGING — MG MM DIGITAL SCREENING BILAT W/ TOMO AND CAD
6 of 10 series · 6 of 30 positions shown · non-contrast
Comparison: None.

CLINICAL DATA: Screening.

EXAM:
DIGITAL SCREENING BILATERAL MAMMOGRAM WITH TOMOSYNTHESIS AND CAD
TECHNIQUE: Bilateral screening digital craniocaudal and mediolateral oblique
mammograms were obtained. Bilateral screening digital breast
tomosynthesis was performed. The images were evaluated with
computer-aided detection.

[R CC synth-2D (1 of 2)]
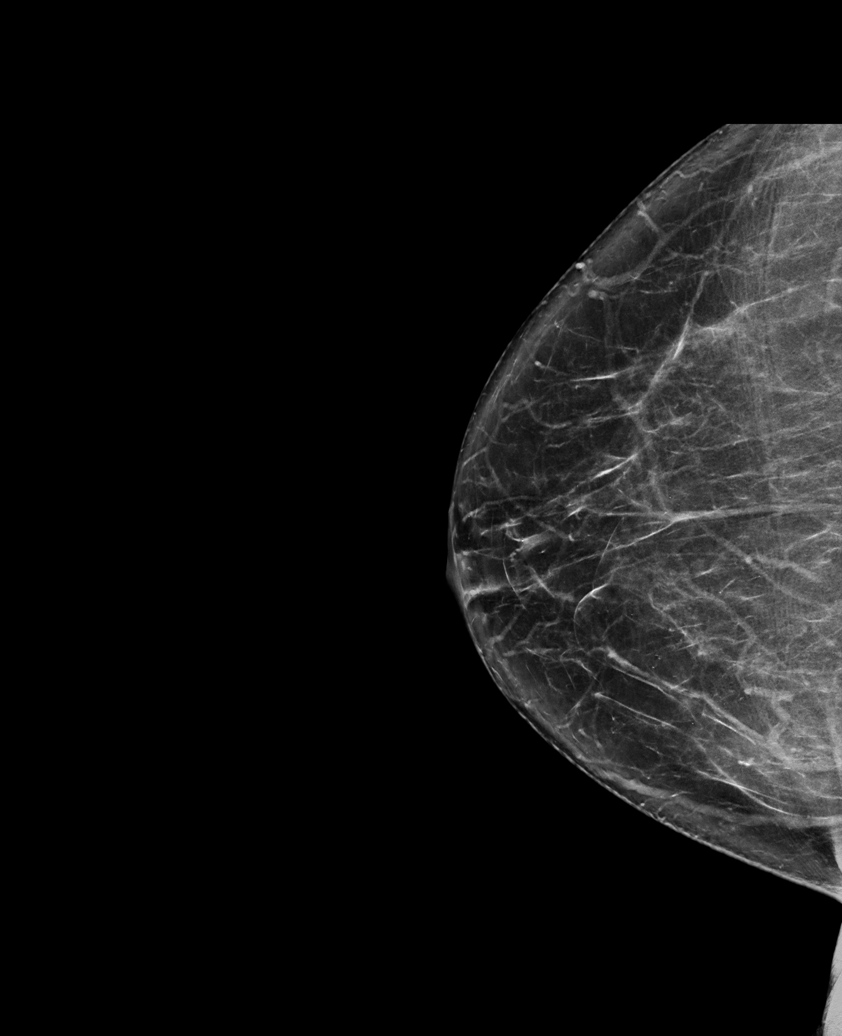

[L CC synth-2D]
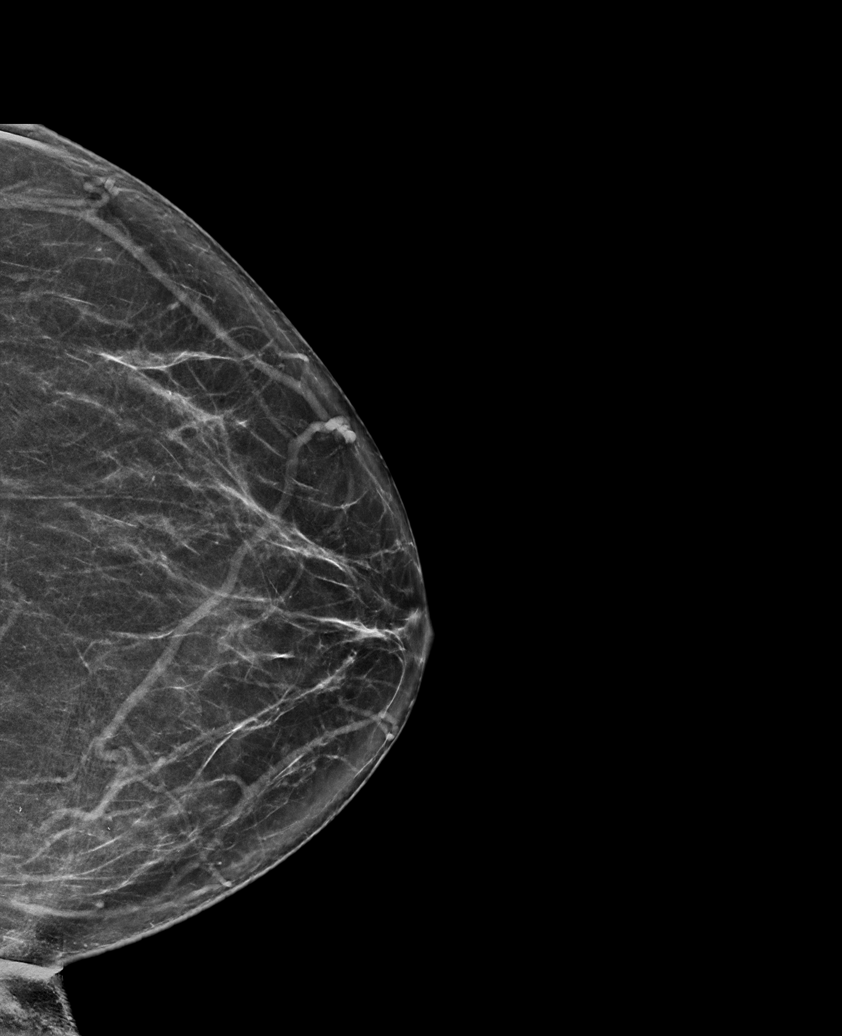

[L MLO synth-2D]
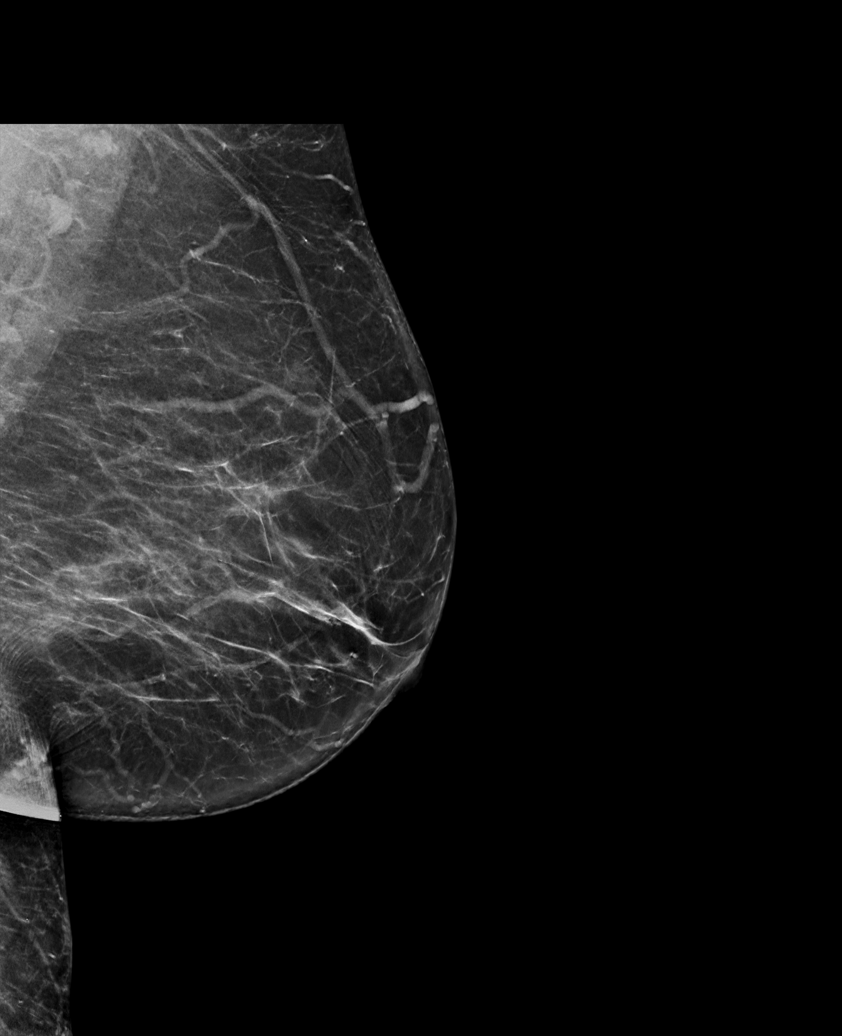

[R CC synth-2D (2 of 2)]
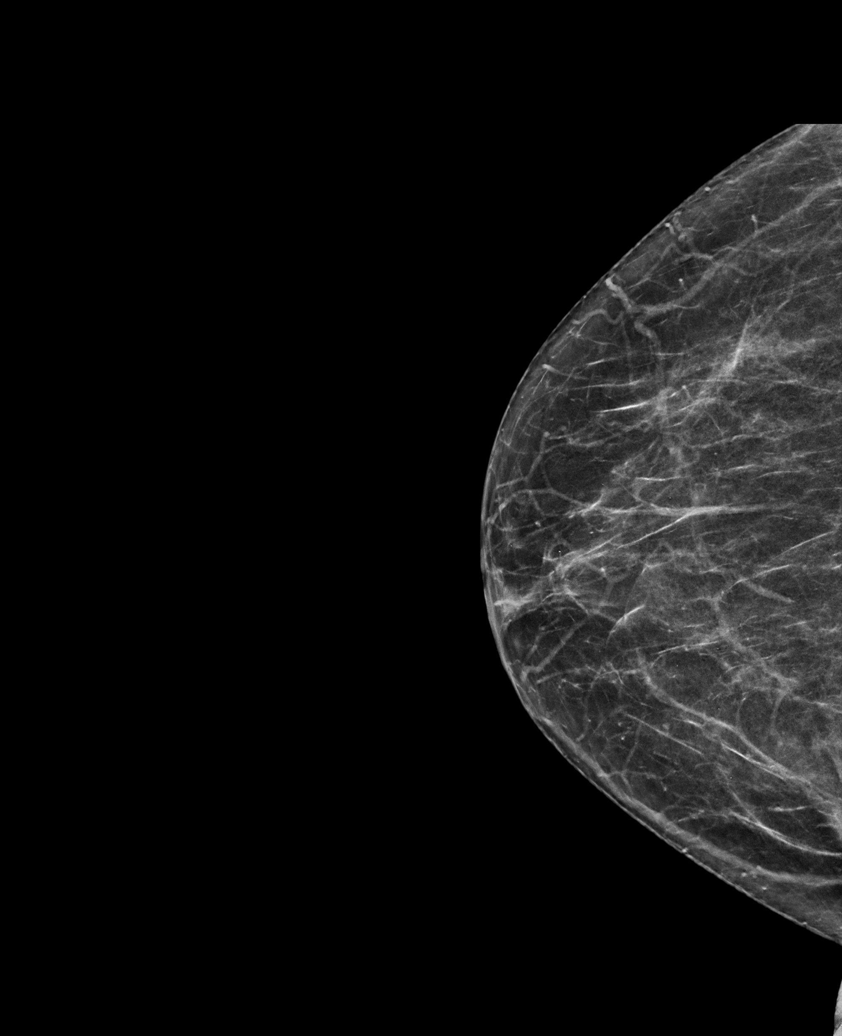

[R MLO synth-2D]
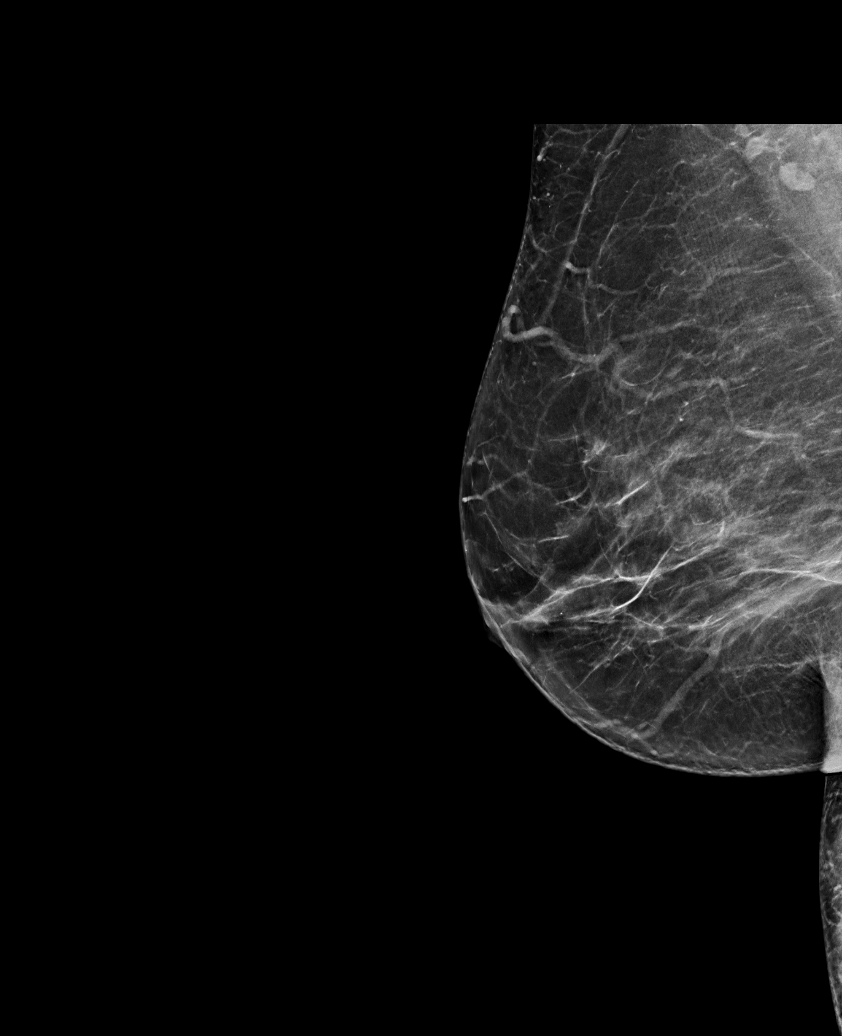

[R MLO tomo · tomo slice 37/74.0]
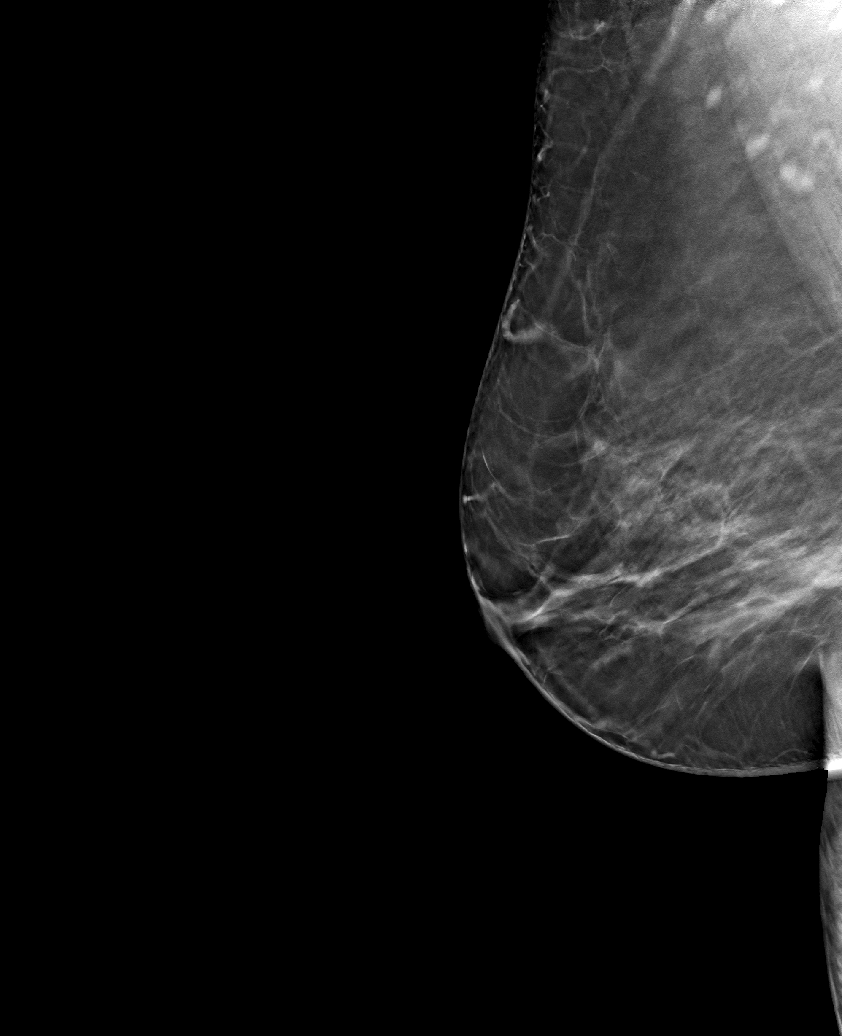

[6 of 30 positions shown; findings below may reference images not displayed]

ACR Breast Density Category b: There are scattered areas of
fibroglandular density.
FINDINGS: There are no findings suspicious for malignancy.
IMPRESSION: No mammographic evidence of malignancy. A result letter of this
screening mammogram will be mailed directly to the patient.

RECOMMENDATION:
Screening mammogram in one year. (Code:XG-X-X7B)

BI-RADS CATEGORY  1: Negative.

## 2023-03-01 ENCOUNTER — Other Ambulatory Visit: Payer: Self-pay | Admitting: Family Medicine

## 2023-03-01 DIAGNOSIS — F3176 Bipolar disorder, in full remission, most recent episode depressed: Secondary | ICD-10-CM

## 2023-03-02 NOTE — Telephone Encounter (Signed)
Requested Prescriptions  Pending Prescriptions Disp Refills   FLUoxetine (PROZAC) 20 MG capsule [Pharmacy Med Name: FLUoxetine HCl Oral Capsule 20 MG] 270 capsule 0    Sig: TAKE 3 CAPSULES EVERY DAY     Psychiatry:  Antidepressants - SSRI Passed - 03/01/2023  5:54 AM      Passed - Valid encounter within last 6 months    Recent Outpatient Visits           3 weeks ago Screening for diabetes mellitus   [facility] [place], Odette Horns, MD   5 months ago Essential hypertension   [facility] [place], Cornelius Moras, RPH-CPP   6 months ago Medication management   [facility] [place], Cornelius Moras, RPH-CPP   6 months ago Essential hypertension   [facility] Hoy Register, MD   10 months ago Screening for diabetes mellitus   [facility] [place], [place], [place]

## 2023-03-19 ENCOUNTER — Emergency Department (HOSPITAL_COMMUNITY): Payer: Medicare HMO

## 2023-03-19 ENCOUNTER — Emergency Department (HOSPITAL_COMMUNITY)
Admission: EM | Admit: 2023-03-19 | Discharge: 2023-03-19 | Disposition: A | Discharge status: Home / Self Care | Payer: Medicare HMO | Attending: Emergency Medicine | Admitting: Emergency Medicine

## 2023-03-19 ENCOUNTER — Encounter (HOSPITAL_COMMUNITY): Payer: Self-pay

## 2023-03-19 ENCOUNTER — Other Ambulatory Visit: Payer: Self-pay

## 2023-03-19 DIAGNOSIS — D259 Leiomyoma of uterus, unspecified: Secondary | ICD-10-CM | POA: Diagnosis not present

## 2023-03-19 DIAGNOSIS — K529 Noninfective gastroenteritis and colitis, unspecified: Secondary | ICD-10-CM | POA: Insufficient documentation

## 2023-03-19 DIAGNOSIS — I10 Essential (primary) hypertension: Secondary | ICD-10-CM | POA: Insufficient documentation

## 2023-03-19 DIAGNOSIS — R197 Diarrhea, unspecified: Secondary | ICD-10-CM | POA: Diagnosis not present

## 2023-03-19 DIAGNOSIS — R1032 Left lower quadrant pain: Secondary | ICD-10-CM

## 2023-03-19 DIAGNOSIS — K449 Diaphragmatic hernia without obstruction or gangrene: Secondary | ICD-10-CM | POA: Diagnosis not present

## 2023-03-19 DIAGNOSIS — N281 Cyst of kidney, acquired: Secondary | ICD-10-CM | POA: Diagnosis not present

## 2023-03-19 LAB — TYPE AND SCREEN
ABO/RH(D): O POS
Antibody Screen: NEGATIVE

## 2023-03-19 LAB — CBC WITH DIFFERENTIAL/PLATELET
Abs Immature Granulocytes: 0.01 10*3/uL (ref 0.00–0.07)
Basophils Absolute: 0.1 10*3/uL (ref 0.0–0.1)
Basophils Relative: 1 %
Eosinophils Absolute: 0.2 10*3/uL (ref 0.0–0.5)
Eosinophils Relative: 3 %
HCT: 39.5 % (ref 36.0–46.0)
Hemoglobin: 12.2 g/dL (ref 12.0–15.0)
Immature Granulocytes: 0 %
Lymphocytes Relative: 28 %
Lymphs Abs: 1.8 10*3/uL (ref 0.7–4.0)
MCH: 26.9 pg (ref 26.0–34.0)
MCHC: 30.9 g/dL (ref 30.0–36.0)
MCV: 87.2 fL (ref 80.0–100.0)
Monocytes Absolute: 0.5 10*3/uL (ref 0.1–1.0)
Monocytes Relative: 7 %
Neutro Abs: 4 10*3/uL (ref 1.7–7.7)
Neutrophils Relative %: 61 %
Platelets: 168 10*3/uL (ref 150–400)
RBC: 4.53 MIL/uL (ref 3.87–5.11)
RDW: 13.8 % (ref 11.5–15.5)
WBC: 6.5 10*3/uL (ref 4.0–10.5)
nRBC: 0 % (ref 0.0–0.2)

## 2023-03-19 LAB — COMPREHENSIVE METABOLIC PANEL
ALT: 13 U/L (ref 0–44)
AST: 16 U/L (ref 15–41)
Albumin: 3.5 g/dL (ref 3.5–5.0)
Alkaline Phosphatase: 64 U/L (ref 38–126)
Anion gap: 10 (ref 5–15)
BUN: 8 mg/dL (ref 8–23)
CO2: 23 mmol/L (ref 22–32)
Calcium: 9 mg/dL (ref 8.9–10.3)
Chloride: 104 mmol/L (ref 98–111)
Creatinine, Ser: 0.58 mg/dL (ref 0.44–1.00)
GFR, Estimated: >60 mL/min (ref >60)
Glucose, Bld: 114 mg/dL — ABNORMAL HIGH (ref 70–99)
Potassium: 3.2 mmol/L — ABNORMAL LOW (ref 3.5–5.1)
Sodium: 137 mmol/L (ref 135–145)
Total Bilirubin: 0.9 mg/dL (ref <1.2)
Total Protein: 7.7 g/dL (ref 6.5–8.1)

## 2023-03-19 LAB — ABO/RH: ABO/RH(D): O POS

## 2023-03-19 LAB — LIPASE, BLOOD: Lipase: 21 U/L (ref 11–51)

## 2023-03-19 LAB — POC OCCULT BLOOD, ED: Fecal Occult Bld: POSITIVE — AB

## 2023-03-19 LAB — PROTIME-INR
INR: 1 (ref 0.8–1.2)
Prothrombin Time: 13.7 s (ref 11.4–15.2)

## 2023-03-19 MED ORDER — CIPROFLOXACIN HCL 500 MG PO TABS
500.0000 mg | ORAL_TABLET | Freq: Once | ORAL | Status: AC
  Administered 2023-03-19: 500 mg via ORAL
  Filled 2023-03-19: qty 1

## 2023-03-19 MED ORDER — IOHEXOL 350 MG/ML SOLN
75.0000 mL | Freq: Once | INTRAVENOUS | Status: AC | PRN
  Administered 2023-03-19: 75 mL via INTRAVENOUS

## 2023-03-19 MED ORDER — SODIUM CHLORIDE 0.9 % IV BOLUS
500.0000 mL | Freq: Once | INTRAVENOUS | Status: AC
  Administered 2023-03-19: 500 mL via INTRAVENOUS

## 2023-03-19 MED ORDER — DICYCLOMINE HCL 20 MG PO TABS: 20.0000 mg | ORAL_TABLET | Freq: Three times a day (TID) | ORAL | 0 refills | Status: AC | PRN

## 2023-03-19 MED ORDER — METRONIDAZOLE 500 MG PO TABS
500.0000 mg | ORAL_TABLET | Freq: Once | ORAL | Status: AC
  Administered 2023-03-19: 500 mg via ORAL
  Filled 2023-03-19: qty 1

## 2023-03-19 MED ORDER — METRONIDAZOLE 500 MG PO TABS: 500.0000 mg | ORAL_TABLET | Freq: Two times a day (BID) | ORAL | 0 refills | Status: AC

## 2023-03-19 MED ORDER — CIPROFLOXACIN HCL 500 MG PO TABS: 500.0000 mg | ORAL_TABLET | Freq: Two times a day (BID) | ORAL | 0 refills | Status: AC

## 2023-03-19 MED ORDER — DICYCLOMINE HCL 10 MG PO CAPS
10.0000 mg | ORAL_CAPSULE | Freq: Once | ORAL | Status: AC
  Administered 2023-03-19: 10 mg via ORAL
  Filled 2023-03-19: qty 1

## 2023-03-19 NOTE — Discharge Instructions (Signed)
You were seen in the emerged from today with abdominal pain.  Your CT scan shows colitis and we are starting antibiotics.  It is important that you follow closely with your primary care doctor.  I have placed a referral in our system for you to see a gastroenterologist to consider doing a colonoscopy.  Please call their office on Monday to help coordinate.  Your primary care doctor may also need to help coordinate this appointment depending on your insurance.  If you develop worsening pain, fever, severe bleeding you should return to the emergency department for reevaluation.

## 2023-03-19 NOTE — ED Triage Notes (Signed)
Pt to ED via [facility] from home. Pt has had nausea and Maryna Yeagle tarry stools for past 2 days. Pt c/o abdominal pain and feeling weak.

## 2023-03-19 NOTE — ED Notes (Signed)
Pt cleaned, small amount of light brown formed stool noted.

## 2023-03-19 NOTE — ED Provider Notes (Signed)
Emergency Department Provider Note   I have reviewed the triage vital signs and the nursing notes.   HISTORY  Chief Complaint Abdominal Pain   HPI Stephanie Juarez is a 72 y.o. female with PMH of HTN presents to the emergency department for evaluation of left lower quadrant abdominal pain with bloody stool.  Symptoms have been going on for the past 2 days.  She has been feeling generally weak and fatigued.  Abdominal pain is moderate and cramping in quality.  She has had some associated diarrhea.  She states that the stool is dark, occasionally black, but often looks like clot/dark red.  She does not take anticoagulant.  No fevers but has had some sweats. No vomiting.    Past Medical History:  Diagnosis Date   Arthritis    Hypertension     Review of Systems  Constitutional: No fever/chills Cardiovascular: Denies chest pain. Respiratory: Denies shortness of breath. Gastrointestinal: Positive LLQ abdominal pain.  No nausea, no vomiting. Positive diarrhea.  No constipation. Genitourinary: Negative for dysuria. Musculoskeletal: Negative for back pain. Skin: Negative for rash. Neurological: Negative for headaches. ____________________________________________   PHYSICAL EXAM:  VITAL SIGNS: ED Triage Vitals  Encounter Vitals Group     BP 03/19/23 0734 (!) 157/67     Pulse Rate 03/19/23 0731 68     Resp 03/19/23 0731 18     Temp 03/19/23 0734 98.7 F (37.1 C)     Temp Source 03/19/23 0731 Oral     SpO2 03/19/23 0731 97 %     Weight 03/19/23 0721 230 lb (104.3 kg)     Height 03/19/23 0721 5\' 4"  (1.626 m)   Constitutional: Alert and oriented. Well appearing and in no acute distress. Eyes: Conjunctivae are normal.  Head: Atraumatic. Nose: No congestion/rhinnorhea. Mouth/Throat: Mucous membranes are moist.   Neck: No stridor.   Cardiovascular: Normal rate, regular rhythm. Good peripheral circulation. Grossly normal heart sounds.   Respiratory: Normal respiratory  effort.  No retractions. Lungs CTAB. Gastrointestinal: Soft with with mild tenderness in the LLQ. No peritonitis. No distention.  Rectal exam performed after obtaining patient's verbal consent and with nurse chaperone present.  No large hemorrhoids visualized.  No active bleeding.  Stool is light brown.  Musculoskeletal: No lower extremity tenderness nor edema. No gross deformities of extremities. Neurologic:  Normal speech and language. No gross focal neurologic deficits are appreciated.  Skin:  Skin is warm, dry and intact. No rash noted.  ____________________________________________   LABS (all labs ordered are listed, but only abnormal results are displayed)  Labs Reviewed  COMPREHENSIVE METABOLIC PANEL - Abnormal; Notable for the following components:      Result Value   Potassium 3.2 (*)    Glucose, Bld 114 (*)    All other components within normal limits  POC OCCULT BLOOD, ED - Abnormal; Notable for the following components:   Fecal Occult Bld POSITIVE (*)    All other components within normal limits  CBC WITH DIFFERENTIAL/PLATELET  PROTIME-INR  LIPASE, BLOOD  TYPE AND SCREEN  ABO/RH   ____________________________________________  RADIOLOGY  CT ABDOMEN PELVIS W CONTRAST  Result Date: 03/19/2023 CLINICAL DATA:  Left lower quadrant abdominal pain. EXAM: CT ABDOMEN AND PELVIS WITH CONTRAST TECHNIQUE: Multidetector CT imaging of the abdomen and pelvis was performed using the standard protocol following bolus administration of intravenous contrast. RADIATION DOSE REDUCTION: This exam was performed according to the departmental dose-optimization program which includes automated exposure control, adjustment of the mA and/or kV according  to patient size and/or use of iterative reconstruction technique. CONTRAST:  75mL OMNIPAQUE IOHEXOL 350 MG/ML SOLN COMPARISON:  CT abdomen and pelvis dated 03/12/2016. FINDINGS: Lower chest: Mild bibasilar atelectasis. Hepatobiliary: No focal liver  abnormality is seen. Status post cholecystectomy. No biliary dilatation. Pancreas: Unremarkable. No pancreatic ductal dilatation or surrounding inflammatory changes. Spleen: Normal in size without focal abnormality. Adrenals/Urinary Tract: Adrenal glands are unremarkable. Bilateral benign renal cysts measure up to 0.8 cm in size. No imaging follow-up is recommended for these findings. No renal calculi or hydronephrosis on either side. Bladder is unremarkable. Stomach/Bowel: There is a small hiatal hernia. There is bowel wall thickening and mild surrounding inflammatory changes of the splenic flexure and proximal descending colon. The appendix appears normal. No evidence of bowel obstruction. Vascular/Lymphatic: Aortic atherosclerosis. No enlarged abdominal or pelvic lymph nodes. Reproductive: A 3.7 cm uterine fibroid is noted. No suspicious adnexal mass. Other: No abdominal wall hernia.  No free fluid in the pelvis. Musculoskeletal: Degenerative changes are seen in the spine. IMPRESSION: 1. Bowel wall thickening and mild surrounding inflammatory changes of the splenic flexure and proximal descending colon consistent with colitis. Aortic Atherosclerosis (ICD10-I70.0). Electronically Signed   By: Romona Curls M.D.   On: 03/19/2023 10:13    ____________________________________________   PROCEDURES  Procedure(s) performed:   Procedures  None  ____________________________________________   INITIAL IMPRESSION / ASSESSMENT AND PLAN / ED COURSE  Pertinent labs & imaging results that were available during my care of the patient were reviewed by me and considered in my medical decision making (see chart for details).   This patient is Presenting for Evaluation of abdominal pain, which does require a range of treatment options, and is a complaint that involves a high risk of morbidity and mortality.  The Differential Diagnoses includes but is not exclusive to ovarian cyst, ovarian torsion, acute  appendicitis, urinary tract infection, endometriosis, bowel obstruction, hernia, colitis, renal colic, gastroenteritis, volvulus etc.   Critical Interventions-    Medications  sodium chloride 0.9 % bolus 500 mL (0 mLs Intravenous Stopped 03/19/23 1134)  iohexol (OMNIPAQUE) 350 MG/ML injection 75 mL (75 mLs Intravenous Contrast Given 03/19/23 0929)  ciprofloxacin (CIPRO) tablet 500 mg (500 mg Oral Given 03/19/23 1106)  metroNIDAZOLE (FLAGYL) tablet 500 mg (500 mg Oral Given 03/19/23 1106)  dicyclomine (BENTYL) capsule 10 mg (10 mg Oral Given 03/19/23 1106)    Reassessment after intervention:  symptoms improved.    I did obtain Additional Historical Information from husband at bedside.  I decided to review pertinent External Data, and in summary no prior colonoscopy.   Clinical Laboratory Tests Ordered, included heme occult positive. Hb normal. No AKI. No leukocytosis.   Radiologic Tests Ordered, included abdominal pain. I independently interpreted the images and agree with radiology interpretation.   Cardiac Monitor Tracing which shows NSR.    Social Determinants of Health Risk patient is a non-smoker.   Medical Decision Making: Summary:  Patient presents to the emergency department for evaluation of left lower quadrant abdominal pain and blood in the bowel movements.  Brown stool on my exam. Diverticulitis is a consideration. No large volume/heavy GI bleeding at this time. Doubt active diverticular bleeding. Defer bleed scan CT at this time.   Reevaluation with update and discussion with patient. Plan for symptom mgmt at home and treatment of colitis with abx. Gave contact info for GI and encouraged ASAP follow up to schedule her first colonoscopy. Advised that depending on insurance requirement, PCP may need to get involved to  assist.   Considered admission but no indication for PRBC transfusion. Patient is HDS without evidence of sepsis.   Patient's presentation is most  consistent with acute presentation with potential threat to life or bodily function.   Disposition: discharge  ____________________________________________  FINAL CLINICAL IMPRESSION(S) / ED DIAGNOSES  Final diagnoses:  Left lower quadrant abdominal pain  Colitis     NEW OUTPATIENT MEDICATIONS STARTED DURING THIS VISIT:  Discharge Medication List as of 03/19/2023 11:08 AM     START taking these medications   Details  ciprofloxacin (CIPRO) 500 MG tablet Take 1 tablet (500 mg total) by mouth every 12 (twelve) hours for 7 days., Starting Sun 03/19/2023, Until Sun 03/26/2023, Normal    dicyclomine (BENTYL) 20 MG tablet Take 1 tablet (20 mg total) by mouth 3 (three) times daily as needed for spasms., Starting Sun 03/19/2023, Normal    metroNIDAZOLE (FLAGYL) 500 MG tablet Take 1 tablet (500 mg total) by mouth 2 (two) times daily for 7 days., Starting Sun 03/19/2023, Until Sun 03/26/2023, Normal        Note:  This document was prepared using Dragon voice recognition software and may include unintentional dictation errors.  Alona Bene, MD, [facility]    Mitsuo Budnick, Arlyss Repress, MD 03/20/23 [phone]

## 2023-03-19 NOTE — ED Notes (Signed)
Pt returned from CT °

## 2023-03-21 ENCOUNTER — Encounter: Payer: Self-pay | Admitting: Emergency Medicine

## 2023-03-27 ENCOUNTER — Encounter: Payer: Self-pay | Admitting: Family Medicine

## 2023-03-29 ENCOUNTER — Other Ambulatory Visit (HOSPITAL_BASED_OUTPATIENT_CLINIC_OR_DEPARTMENT_OTHER): Payer: Medicare HMO | Admitting: Pharmacist

## 2023-03-29 DIAGNOSIS — E7800 Pure hypercholesterolemia, unspecified: Secondary | ICD-10-CM

## 2023-03-29 NOTE — Progress Notes (Signed)
Pharmacy Quality Measure Review  This patient is appearing on a report for being at risk of failing the adherence measure for cholesterol (statin) medications this calendar year.   Medication: rosuvastatin Last fill date: 02/06/23 for 90 day supply. Absolute fail date on this report is 04/18/2023 based on a last fill date of 10/16/2022.  Insurance report was not up to date. No action needed at this time.   [name], PharmD, [place], CPP Clinical Pharmacist [facility] [phone]

## 2023-04-12 ENCOUNTER — Ambulatory Visit: Payer: Medicare HMO | Admitting: Gastroenterology

## 2023-04-12 DIAGNOSIS — H903 Sensorineural hearing loss, bilateral: Secondary | ICD-10-CM | POA: Diagnosis not present

## 2023-05-14 ENCOUNTER — Other Ambulatory Visit: Payer: Self-pay | Admitting: Family Medicine

## 2023-05-14 DIAGNOSIS — F3176 Bipolar disorder, in full remission, most recent episode depressed: Secondary | ICD-10-CM

## 2023-07-13 ENCOUNTER — Other Ambulatory Visit: Payer: Self-pay | Admitting: Family Medicine

## 2023-07-13 DIAGNOSIS — I10 Essential (primary) hypertension: Secondary | ICD-10-CM

## 2023-07-13 NOTE — Telephone Encounter (Signed)
 Rx 02/07/23 #90 1RF- too soon- and needs appointment Requested Prescriptions  Pending Prescriptions Disp Refills   amLODipine (NORVASC) 10 MG tablet [Pharmacy Med Name: amLODIPine Besylate Oral Tablet 10 MG] 90 tablet 3    Sig: TAKE 1 TABLET EVERY DAY     Cardiovascular: Calcium Channel Blockers 2 Failed - 07/13/2023  4:05 PM      Failed - Last BP in normal range    BP Readings from Last 1 Encounters:  03/19/23 (!) 152/76         Passed - Last Heart Rate in normal range    Pulse Readings from Last 1 Encounters:  03/19/23 72         Passed - Valid encounter within last 6 months    Recent Outpatient Visits           5 months ago Screening for diabetes mellitus   [facility] [place]. [facility] Hoy Register, MD   9 months ago Essential hypertension   [facility] [place]. [facility] Lois [place], Cornelius Moras, RPH-CPP   10 months ago Medication management   [facility] [place]. [facility] Drucilla Chalet, RPH-CPP   11 months ago Essential hypertension   [facility] [place]. [facility] Hoy Register, MD   1 year ago Screening for diabetes mellitus   [facility] [place]. [facility] [place], [place], [place]

## 2023-07-17 DIAGNOSIS — H5213 Myopia, bilateral: Secondary | ICD-10-CM | POA: Diagnosis not present

## 2023-07-27 ENCOUNTER — Other Ambulatory Visit: Payer: Self-pay | Admitting: Family Medicine

## 2023-07-27 DIAGNOSIS — I10 Essential (primary) hypertension: Secondary | ICD-10-CM

## 2023-07-27 DIAGNOSIS — F3176 Bipolar disorder, in full remission, most recent episode depressed: Secondary | ICD-10-CM

## 2023-09-12 ENCOUNTER — Ambulatory Visit: Payer: Medicare HMO | Attending: Family Medicine

## 2023-09-12 VITALS — Ht 64.0 in | Wt 236.0 lb

## 2023-09-12 DIAGNOSIS — Z0000 Encounter for general adult medical examination without abnormal findings: Secondary | ICD-10-CM

## 2023-09-12 DIAGNOSIS — Z1239 Encounter for other screening for malignant neoplasm of breast: Secondary | ICD-10-CM

## 2023-09-12 NOTE — Progress Notes (Signed)
 Because this visit was a virtual/telehealth visit,  certain criteria was not obtained, such a blood pressure, CBG if applicable, and timed get up and go. Any medications not marked as "taking" were not mentioned during the medication reconciliation part of the visit. Any vitals not documented were not able to be obtained due to this being a telehealth visit or patient was unable to self-report a recent blood pressure reading due to a lack of equipment at home via telehealth. Vitals that have been documented are verbally provided by the patient.   Subjective:   Stephanie Juarez is a 73 y.o. who presents for a Medicare Wellness preventive visit.  Visit Complete: Virtual I connected with  Stephanie Juarez on 09/12/23 by a audio enabled telemedicine application and verified that I am speaking with the correct person using two identifiers.  Patient Location: Home  Provider Location: Office/Clinic  I discussed the limitations of evaluation and management by telemedicine. The patient expressed understanding and agreed to proceed.  Vital Signs: Because this visit was a virtual/telehealth visit, some criteria may be missing or patient reported. Any vitals not documented were not able to be obtained and vitals that have been documented are patient reported.  VideoDeclined- This patient declined Librarian, academic. Therefore the visit was completed with audio only.  Persons Participating in Visit: Patient.  AWV Questionnaire: No: Patient Medicare AWV questionnaire was not completed prior to this visit.  Cardiac Risk Factors include: advanced age (>70men, >26 women);sedentary lifestyle;dyslipidemia;hypertension;obesity (BMI >30kg/m2);family history of premature cardiovascular disease     Objective:    Today's Vitals   09/12/23 0915  Weight: 236 lb (107 kg)  Height: 5\' 4"  (1.626 m)  PainSc: 0-No pain   Body mass index is 40.51 kg/m.     09/12/2023    9:20 AM  03/19/2023    7:22 AM 09/08/2022    9:34 AM 01/30/2021   11:20 AM 03/29/2018   10:27 AM 01/04/2017    2:21 PM 08/04/2016   11:16 AM  Advanced Directives  Does Patient Have a Medical Advance Directive? No No No No No No No  Would patient like information on creating a medical advance directive? No - Patient declined   No - Patient declined No - Patient declined  No - Patient declined    Current Medications (verified) Outpatient Encounter Medications as of 09/12/2023  Medication Sig   amLODipine  (NORVASC ) 10 MG tablet Take 1 tablet (10 mg total) by mouth daily.   buPROPion (WELLBUTRIN) 100 MG tablet Take 100 mg by mouth daily.   carvedilol  (COREG ) 6.25 MG tablet TAKE 1 TABLET TWICE DAILY WITH MEALS   cetirizine  (ZYRTEC ) 10 MG tablet Take 1 tablet (10 mg total) by mouth daily.   diclofenac  Sodium (VOLTAREN  ARTHRITIS PAIN) 1 % GEL Apply 2 g topically 4 (four) times daily.   dicyclomine  (BENTYL ) 20 MG tablet Take 1 tablet (20 mg total) by mouth 3 (three) times daily as needed for spasms.   FLUoxetine  (PROZAC ) 20 MG capsule TAKE 3 CAPSULES EVERY DAY   meclizine  (ANTIVERT ) 25 MG tablet Take 1 tablet (25 mg total) by mouth 2 (two) times daily as needed for dizziness.   naproxen  (NAPROSYN ) 500 MG tablet Take 1 tablet (500 mg total) by mouth 2 (two) times daily with a meal.   rosuvastatin  (CRESTOR ) 5 MG tablet Take 1 tablet (5 mg total) by mouth daily.   solifenacin  (VESICARE ) 5 MG tablet Take 1 tablet (5 mg total) by mouth daily.   [  DISCONTINUED] loratadine  (CLARITIN ) 10 MG tablet Take 1 tablet (10 mg total) by mouth daily. (Patient not taking: Reported on 06/26/2014)   No facility-administered encounter medications on file as of 09/12/2023.    Allergies (verified) Tramadol  and Vicks nyquil cold & flu night [dm-apap-cpm]   History: Past Medical History:  Diagnosis Date   Arthritis    Hypertension    Past Surgical History:  Procedure Laterality Date   CHOLECYSTECTOMY     09/2021   MANDIBLE  SURGERY Left 05/10/1979   impacted teeth   SHOULDER ARTHROSCOPY Left 05/10/1999   for cyst   Family History  Problem Relation Age of Onset   Arthritis Mother    Heart disease Mother    Heart disease Father    Social History   Socioeconomic History   Marital status: Single    Spouse name: Not on file   Number of children: Not on file   Years of education: Not on file   Highest education level: Not on file  Occupational History   Not on file  Tobacco Use   Smoking status: Former   Smokeless tobacco: Never  Vaping Use   Vaping status: Never Used  Substance and Sexual Activity   Alcohol use: No   Drug use: No   Sexual activity: Not on file  Other Topics Concern   Not on file  Social History Narrative   Not on file   Social Drivers of Health   Financial Resource Strain: Low Risk  (09/12/2023)   Overall Financial Resource Strain (CARDIA)    Difficulty of Paying Living Expenses: Not hard at all  Food Insecurity: No Food Insecurity (09/12/2023)   Hunger Vital Sign    Worried About Running Out of Food in the Last Year: Never true    Ran Out of Food in the Last Year: Never true  Transportation Needs: No Transportation Needs (09/12/2023)   PRAPARE - Administrator, Civil Service (Medical): No    Lack of Transportation (Non-Medical): No  Physical Activity: Inactive (09/12/2023)   Exercise Vital Sign    Days of Exercise per Week: 0 days    Minutes of Exercise per Session: 0 min  Stress: No Stress Concern Present (09/12/2023)   [employer] of Occupational Health - Occupational Stress Questionnaire    Feeling of Stress : Only a little  Social Connections: Moderately Integrated (09/12/2023)   Social Connection and Isolation Panel [NHANES]    Frequency of Communication with Friends and Family: More than three times a week    Frequency of Social Gatherings with Friends and Family: More than three times a week    Attends Religious Services: More than 4 times per year     Active Member of [employer] or Organizations: Yes    Attends Banker Meetings: More than 4 times per year    Marital Status: Widowed    Tobacco Counseling Counseling given: Not Answered    Clinical Intake:  Pre-visit preparation completed: Yes  Pain : No/denies pain Pain Score: 0-No pain     BMI - recorded: 40.51 Nutritional Status: BMI > 30  Obese Nutritional Risks: None Diabetes: No  Lab Results  Component Value Date   HGBA1C 5.2 02/07/2023   HGBA1C 5.2 04/27/2022   HGBA1C 4.8 01/04/2017     How often do you need to have someone help you when you read instructions, pamphlets, or other written materials from your doctor or pharmacy?: 1 - Never  Interpreter Needed?: No  Information  entered by :: Ellarae Nevitt N. Fraidy Mccarrick, LPN.   Activities of Daily Living     09/12/2023    9:21 AM  In your present state of health, do you have any difficulty performing the following activities:  Hearing? 1  Comment HEARING AIDS  Vision? 0  Difficulty concentrating or making decisions? 0  Comment BSE: READING, PUZZLES, GAMES ON PHONE  Walking or climbing stairs? 1  Comment CANE  Dressing or bathing? 0  Doing errands, shopping? 0  Preparing Food and eating ? N  Using the Toilet? N  In the past six months, have you accidently leaked urine? Y  Comment URGENCY  Do you have problems with loss of bowel control? Y  Comment GALLBLADDER REMOVED  Managing your Medications? N  Managing your Finances? N  Housekeeping or managing your Housekeeping? N    Patient Care Team: Joaquin Mulberry, MD as PCP - General (Family Medicine) Janita Mellow, MD as Consulting Physician (Otolaryngology) Buck Carbon, [facility] [place], [place] as Referring Physician (Optometry)  Indicate any recent Medical Services you may have received from other than [facility] providers in the past year (date may be approximate).     Assessment:   This is a routine wellness examination for Stephanie Juarez.  Hearing/Vision screen Hearing Screening  - Comments:: Patient wears hearing aids. Vision Screening - Comments:: Wears contacts - up to date with routine eye exams with [facility]    Goals Addressed             This Visit's Progress    Patient Stated       09/12/2023: My goal for 2025 is to lose weight and be more active.       Depression Screen     09/12/2023    9:22 AM 02/07/2023    9:18 AM 09/08/2022    9:35 AM 08/08/2022    9:53 AM 04/27/2022   11:44 AM 06/30/2021   10:15 AM 01/30/2021   11:11 AM  PHQ 2/9 Scores  PHQ - 2 Score 0 2 0 0 0 1 2  PHQ- 9 Score 3 8  7 7 10 6     Fall Risk     09/12/2023    9:17 AM 02/07/2023    9:17 AM 09/08/2022    9:35 AM 06/30/2021    9:35 AM 01/30/2021   11:21 AM  Fall Risk   Falls in the past year? 0 0 0 0 1  Number falls in past yr: 0 0 0 0 0  Injury with Fall? 0 0 0 0 0  Risk for fall due to : No Fall Risks No Fall Risks Medication side effect    Follow up Falls prevention discussed;Falls evaluation completed Falls evaluation completed Falls prevention discussed;Education provided;Falls evaluation completed      MEDICARE RISK AT HOME:  Medicare Risk at Home Any stairs in or around the home?: No (RAMP) If so, are there any without handrails?: No Home free of loose throw rugs in walkways, pet beds, electrical cords, etc?: Yes Adequate lighting in your home to reduce risk of falls?: Yes Life alert?: No Use of a cane, walker or w/c?: Yes Grab bars in the bathroom?: No Shower chair or bench in shower?: Yes (BUILT IN SEAT IN SHOWER) Elevated toilet seat or a handicapped toilet?: Yes  TIMED UP AND GO:  Was the test performed?  No  Cognitive Function: 6CIT completed    09/12/2023    9:25 AM  MMSE - Mini Mental State Exam  Not completed: Unable  to complete        09/12/2023    9:37 AM 09/08/2022    9:39 AM 01/30/2021   11:24 AM  6CIT Screen  What Year? 0 points 0 points 0 points  What month? 0 points 0 points 0 points  What time? 0 points 0 points 0 points  Count  back from 20 0 points 0 points 0 points  Months in reverse 0 points 0 points 0 points  Repeat phrase 0 points 0 points 0 points  Total Score 0 points 0 points 0 points    Immunizations Immunization History  Administered Date(s) Administered   Fluad Trivalent(High Dose 65+) 02/07/2023   Influenza,inj,Quad PF,6+ Mos 06/26/2014, 02/05/2015, 03/29/2018, 04/27/2022   Janssen (J&J) SARS-COV-2 Vaccination 07/22/2019   Pneumococcal Conjugate-13 05/14/2019   Pneumococcal Polysaccharide-23 06/30/2021   Tdap 06/26/2014    Screening Tests Health Maintenance  Topic Date Due   Zoster Vaccines- Shingrix (1 of 2) Never done   COVID-19 Vaccine (2 - Janssen risk series) 08/19/2019   MAMMOGRAM  01/03/2023   INFLUENZA VACCINE  12/08/2023   DTaP/Tdap/Td (2 - Td or Tdap) 06/26/2024   Colonoscopy  06/26/2024   Medicare Annual Wellness (AWV)  09/11/2024   Pneumonia Vaccine 64+ Years old  Completed   DEXA SCAN  Completed   Hepatitis C Screening  Completed   HPV VACCINES  Aged Out   Meningococcal B Vaccine  Aged Out    Health Maintenance  Health Maintenance Due  Topic Date Due   Zoster Vaccines- Shingrix (1 of 2) Never done   COVID-19 Vaccine (2 - Janssen risk series) 08/19/2019   MAMMOGRAM  01/03/2023   Health Maintenance Items Addressed: Yes Mammogram ordered  Additional Screening:  Vision Screening: Recommended annual ophthalmology exams for early detection of glaucoma and other disorders of the eye.  Dental Screening: Recommended annual dental exams for proper oral hygiene  Community Resource Referral / Chronic Care Management: CRR required this visit?  No   CCM required this visit?  No     Plan:     I have personally reviewed and noted the following in the patient's chart:   Medical and social history Use of alcohol, tobacco or illicit drugs  Current medications and supplements including opioid prescriptions. Patient is not currently taking opioid  prescriptions. Functional ability and status Nutritional status Physical activity Advanced directives List of other physicians Hospitalizations, surgeries, and ER visits in previous 12 months Vitals Screenings to include cognitive, depression, and falls Referrals and appointments  In addition, I have reviewed and discussed with patient certain preventive protocols, quality metrics, and best practice recommendations. A written personalized care plan for preventive services as well as general preventive health recommendations were provided to patient.     Margette Sheldon, LPN   05/14/1094   After Visit Summary: (Mail) Due to this being a telephonic visit, the after visit summary with patients personalized plan was offered to patient via mail   Nurse Notes: Mammogram was ordered to be done at [facility].

## 2023-09-12 NOTE — Patient Instructions (Addendum)
 Stephanie Juarez , Thank you for taking time to come for your Medicare Wellness Visit. I appreciate your ongoing commitment to your health goals. Please review the following plan we discussed and let me know if I can assist you in the future.   Referrals/Orders/Follow-Ups/Clinician Recommendations: Mammogram screening was ordered to be done at [facility] located at [address] [place],  [place]  [address] [phone].  This is a list of the screening recommended for you and due dates:  Health Maintenance  Topic Date Due   Zoster (Shingles) Vaccine (1 of 2) Never done   COVID-19 Vaccine (2 - Janssen risk series) 08/19/2019   Mammogram  01/03/2023   Flu Shot  12/08/2023   DTaP/Tdap/Td vaccine (2 - Td or Tdap) 06/26/2024   Colon Cancer Screening  06/26/2024   Medicare Annual Wellness Visit  09/11/2024   Pneumonia Vaccine  Completed   DEXA scan (bone density measurement)  Completed   Hepatitis C Screening  Completed   HPV Vaccine  Aged Out   Meningitis B Vaccine  Aged Out    Advanced directives: (Declined) Advance directive discussed with you today. Even though you declined this today, please call our office should you change your mind, and we can give you the proper paperwork for you to fill out.  Next Medicare Annual Wellness Visit scheduled for next year: Yes, 09/17/2024 at 9:10 a.m. with Nurse Health Advisor  Have you seen your provider in the last 6 months (3 months if uncontrolled diabetes)? No, Your next appointment with Dr. Newlin is scheduled for 11/07/2023 at 10:30 a.m.

## 2023-11-07 ENCOUNTER — Ambulatory Visit: Attending: Family Medicine | Admitting: Family Medicine

## 2023-11-07 ENCOUNTER — Encounter: Payer: Self-pay | Admitting: Family Medicine

## 2023-11-07 ENCOUNTER — Other Ambulatory Visit: Payer: Self-pay

## 2023-11-07 VITALS — BP 180/89 | HR 63 | Ht 64.0 in | Wt 235.6 lb

## 2023-11-07 DIAGNOSIS — N3281 Overactive bladder: Secondary | ICD-10-CM

## 2023-11-07 DIAGNOSIS — E7800 Pure hypercholesterolemia, unspecified: Secondary | ICD-10-CM | POA: Diagnosis not present

## 2023-11-07 DIAGNOSIS — I10 Essential (primary) hypertension: Secondary | ICD-10-CM

## 2023-11-07 DIAGNOSIS — G720 Drug-induced myopathy: Secondary | ICD-10-CM | POA: Diagnosis not present

## 2023-11-07 DIAGNOSIS — R42 Dizziness and giddiness: Secondary | ICD-10-CM | POA: Diagnosis not present

## 2023-11-07 DIAGNOSIS — G4709 Other insomnia: Secondary | ICD-10-CM

## 2023-11-07 DIAGNOSIS — T466X5A Adverse effect of antihyperlipidemic and antiarteriosclerotic drugs, initial encounter: Secondary | ICD-10-CM | POA: Diagnosis not present

## 2023-11-07 DIAGNOSIS — M1712 Unilateral primary osteoarthritis, left knee: Secondary | ICD-10-CM | POA: Diagnosis not present

## 2023-11-07 DIAGNOSIS — E876 Hypokalemia: Secondary | ICD-10-CM | POA: Diagnosis not present

## 2023-11-07 MED ORDER — HYDROXYZINE HCL 25 MG PO TABS
25.0000 mg | ORAL_TABLET | Freq: Every evening | ORAL | 1 refills | Status: AC | PRN
  Filled 2023-11-07 (×2): qty 90, 90d supply, fill #0
  Filled 2024-03-26: qty 90, 90d supply, fill #1

## 2023-11-07 MED ORDER — MECLIZINE HCL 25 MG PO TABS
25.0000 mg | ORAL_TABLET | Freq: Two times a day (BID) | ORAL | 3 refills | Status: AC | PRN
  Filled 2023-11-07 (×3): qty 180, 90d supply, fill #0

## 2023-11-07 MED ORDER — SOLIFENACIN SUCCINATE 5 MG PO TABS
5.0000 mg | ORAL_TABLET | Freq: Every day | ORAL | 3 refills | Status: AC
  Filled 2023-11-07: qty 90, 90d supply, fill #0
  Filled 2024-03-26: qty 90, 90d supply, fill #1

## 2023-11-07 MED ORDER — AMLODIPINE BESYLATE 10 MG PO TABS
10.0000 mg | ORAL_TABLET | Freq: Every day | ORAL | 3 refills | Status: AC
  Filled 2023-11-07: qty 90, 90d supply, fill #0
  Filled 2024-03-26: qty 90, 90d supply, fill #1

## 2023-11-07 MED ORDER — MELOXICAM 7.5 MG PO TABS
7.5000 mg | ORAL_TABLET | Freq: Every day | ORAL | 1 refills | Status: DC
  Filled 2023-11-07: qty 30, 30d supply, fill #0
  Filled 2024-03-26 (×2): qty 30, 30d supply, fill #1

## 2023-11-07 MED ORDER — ROSUVASTATIN CALCIUM 5 MG PO TABS
5.0000 mg | ORAL_TABLET | Freq: Every day | ORAL | 1 refills | Status: AC
  Filled 2023-11-07: qty 90, 90d supply, fill #0

## 2023-11-07 NOTE — Patient Instructions (Signed)
 Chronic Knee Pain, Adult Knee pain that lasts longer than 3 months is called chronic knee pain. You may have pain in one or both knees. Symptoms of chronic knee pain may also include swelling and stiffness. Many conditions can cause chronic knee pain. The most common cause is wear and tear of your knee joint as you get older. Other possible causes include: A disease that causes inflammation of the knee, such as rheumatoid arthritis. This usually affects both knees. A condition called inflammatory arthritis, such as gout. An injury to the knee that causes arthritis. An injury to the knee that damages the ligaments. Ligaments are tissues that connect bones to each other. Runner's knee or pain behind the kneecap. Treatment for chronic knee pain depends on the cause. The main treatments for chronic knee pain are: Doing exercises to help your knee move better and get stronger, called physical therapy. Losing weight if you are overweight. This condition may also be treated with medicines, injections, a knee sleeve or brace, and by using crutches. You health care provider may also recommend rest, ice, pressure (compression), and elevation, also called RICE therapy. Follow these instructions at home: If you have a knee sleeve or brace that can be taken off:  Wear the knee sleeve or brace as told by your provider. Take it off only if your provider says that you can. Check the skin around it every day. Tell your provider if you see problems. Loosen the knee sleeve or brace if your toes tingle, are numb, or turn cold and blue. Keep the knee sleeve or brace clean and dry. Bathing If the knee sleeve or brace is not waterproof: Do not let it get wet. Cover it when you take a bath or shower. Use a cover that does not let any water in. Managing pain, stiffness, and swelling     If told, put heat on the area. Do this as often as told. Use the heat source that your provider recommends, such as a moist  heat pack or a heating pad. If you have a knee sleeve or brace that you can take off, remove it as told. Place a towel between your skin and the heat source. Leave the heat on for 20-30 minutes. If told, put ice on the area. If you have a knee sleeve or brace that you can take off, remove it as told. Put ice in a plastic bag. Place a towel between your skin and the bag. Leave the ice on for 20 minutes, 2-3 times a day. If your skin turns bright red, remove the ice or heat right away to prevent skin damage. The risk of damage is higher if you cannot feel pain, heat, or cold. Move your toes often to reduce stiffness and swelling. Raise the injured area above the level of your heart while you are sitting or lying down. Use a pillow to support your foot as needed. Activity Avoid activities where both feet leave the ground at the same time. Avoid running, jumping rope, and doing jumping jacks. Follow the exercise plan that your provider made for you. Your provider may suggest that you: Avoid activities that make knee pain worse. This may mean that you need to change your exercise routines, sports, or job duties. Wear shoes with cushioned soles. Avoid sports that require running and sudden changes in direction. Do physical therapy. Physical therapy helps your knee move better and get stronger. Exercise as told. Do exercises that increase balance and strength, such as  tai chi and yoga. Do not stand or walk on your injured knee until you're told it's okay. Use crutches as told. Return to normal activities when you're told. Ask what things are safe for you to do. General instructions Take your medicines only as told by your provider. If you are overweight, work with your provider and an expert in healthy eating called a dietitian to set goals to lose weight. Losing even a little weight can reduce knee pain. Being overweight can make your knee hurt more. Do not smoke, vape, or use products with  nicotine or tobacco in them. If you need help quitting, talk with your provider. Keep all follow-up visits. Your provider will monitor your pain and try other treatments if needed. Contact a health care provider if: You have knee pain that is not getting better or gets worse. You are not able to do your exercises due to knee pain. Get help right away if: Your knee swells and the swelling becomes worse. You cannot move your knee. You have severe knee pain. This information is not intended to replace advice given to you by your health care provider. Make sure you discuss any questions you have with your health care provider. Document Revised: 01/26/2023 Document Reviewed: 06/20/2022 Elsevier Patient Education  2024 [employer].

## 2023-11-07 NOTE — Progress Notes (Signed)
 Subjective:  Patient ID: Stephanie Juarez, female    DOB: [birth year]  Age: 73 y.o. MRN: [id number]  CC: Medical Management of Chronic Issues (Discuss pain medication for legs/)     Discussed the use of AI scribe software for clinical note transcription with the patient, who gave verbal consent to proceed.  History of Present Illness Stephanie Juarez is a 73 year old female with a history of hypertension, hyperlipidemia, obesity, chronic left knee pain, bipolar disorder who presents for medication refills and management of insomnia.  She has run out of amlodipine  10 mg, which she usually receives as a 90-day supply. She suspects it may have been stolen from her mailbox and has not been taking it. She has been adherent with carvedilol .  Blood pressure is elevated but per patient it is usually controlled at home when she is on both antihypertensives.  She experiences urinary incontinence and has not been taking VESIcare , which she previously used to manage this condition. She needs a refill of this medication.  She experiences stiffness and pain associated with rosuvastatin  for cholesterol management, particularly in her knees and arms. She has tried taking it every other day, noting less severe stiffness on the second day without the medication.   Her insomnia has worsened over the past six to eight months, with difficulty falling and staying asleep, often sleeping only two hours without medication. She has been using Benadryl and Unisom but finds them less effective. She also takes Prozac  for bipolar depression, which she feels does not help her sleep issues.  She experiences knee pain, particularly in the left knee, which sometimes gives out. She recalls being advised to have knee surgery in the past but did not proceed due to insurance issues. She has used meloxicam  for pain management, which she found effective but is currently on naproxen  which she thinks is not as effective as meloxicam   was.    Past Medical History:  Diagnosis Date   Arthritis    Hypertension     Past Surgical History:  Procedure Laterality Date   CHOLECYSTECTOMY     09/2021   MANDIBLE SURGERY Left 05/10/1979   impacted teeth   SHOULDER ARTHROSCOPY Left 05/10/1999   for cyst    Family History  Problem Relation Age of Onset   Arthritis Mother    Heart disease Mother    Heart disease Father     Social History   Socioeconomic History   Marital status: Single    Spouse name: Not on file   Number of children: Not on file   Years of education: Not on file   Highest education level: Not on file  Occupational History   Not on file  Tobacco Use   Smoking status: Former   Smokeless tobacco: Never  Vaping Use   Vaping status: Never Used  Substance and Sexual Activity   Alcohol use: No   Drug use: No   Sexual activity: Not on file  Other Topics Concern   Not on file  Social History Narrative   Not on file   Social Drivers of Health   Financial Resource Strain: Low Risk  (09/12/2023)   Overall Financial Resource Strain (CARDIA)    Difficulty of Paying Living Expenses: Not hard at all  Food Insecurity: No Food Insecurity (09/12/2023)   Hunger Vital Sign    Worried About Running Out of Food in the Last Year: Never true    Ran Out of Food in the Last Year: Never  true  Transportation Needs: No Transportation Needs (09/12/2023)   PRAPARE - Administrator, Civil Service (Medical): No    Lack of Transportation (Non-Medical): No  Physical Activity: Inactive (09/12/2023)   Exercise Vital Sign    Days of Exercise per Week: 0 days    Minutes of Exercise per Session: 0 min  Stress: No Stress Concern Present (09/12/2023)   [employer] of Occupational Health - Occupational Stress Questionnaire    Feeling of Stress : Only a little  Social Connections: Moderately Integrated (09/12/2023)   Social Connection and Isolation Panel    Frequency of Communication with Friends and Family: More  than three times a week    Frequency of Social Gatherings with Friends and Family: More than three times a week    Attends Religious Services: More than 4 times per year    Active Member of [employer] or Organizations: Yes    Attends Banker Meetings: More than 4 times per year    Marital Status: Widowed    Allergies  Allergen Reactions   Tramadol  Itching   Vicks Nyquil Cold & Flu Night [Dm-Apap-Cpm]     itch    Outpatient Medications Prior to Visit  Medication Sig Dispense Refill   buPROPion (WELLBUTRIN) 100 MG tablet Take 100 mg by mouth daily.     carvedilol  (COREG ) 6.25 MG tablet TAKE 1 TABLET TWICE DAILY WITH MEALS 180 tablet 3   cetirizine  (ZYRTEC ) 10 MG tablet Take 1 tablet (10 mg total) by mouth daily. 30 tablet 1   diclofenac  Sodium (VOLTAREN  ARTHRITIS PAIN) 1 % GEL Apply 2 g topically 4 (four) times daily. 100 g 2   dicyclomine  (BENTYL ) 20 MG tablet Take 1 tablet (20 mg total) by mouth 3 (three) times daily as needed for spasms. 20 tablet 0   FLUoxetine  (PROZAC ) 20 MG capsule TAKE 3 CAPSULES EVERY DAY 270 capsule 3   amLODipine  (NORVASC ) 10 MG tablet Take 1 tablet (10 mg total) by mouth daily. 90 tablet 1   meclizine  (ANTIVERT ) 25 MG tablet Take 1 tablet (25 mg total) by mouth 2 (two) times daily as needed for dizziness. 180 tablet 1   naproxen  (NAPROSYN ) 500 MG tablet Take 1 tablet (500 mg total) by mouth 2 (two) times daily with a meal. 180 tablet 1   rosuvastatin  (CRESTOR ) 5 MG tablet Take 1 tablet (5 mg total) by mouth daily. 90 tablet 1   solifenacin  (VESICARE ) 5 MG tablet Take 1 tablet (5 mg total) by mouth daily. 90 tablet 1   No facility-administered medications prior to visit.     ROS Review of Systems  Constitutional:  Negative for activity change and appetite change.  HENT:  Negative for sinus pressure and sore throat.   Respiratory:  Negative for chest tightness, shortness of breath and wheezing.   Cardiovascular:  Negative for chest pain and  palpitations.  Gastrointestinal:  Negative for abdominal distention, abdominal pain and constipation.  Genitourinary: Negative.   Musculoskeletal:        See HPI  Psychiatric/Behavioral:  Positive for sleep disturbance. Negative for behavioral problems and dysphoric mood.     Objective:  BP (!) 180/89   Pulse 63   Ht 5' 4 (1.626 m)   Wt 235 lb 9.6 oz (106.9 kg)   SpO2 97%   BMI 40.44 kg/m      11/07/2023   11:29 AM 11/07/2023   10:50 AM 09/12/2023    9:15 AM  BP/Weight  Systolic BP 180  178   Diastolic BP 89 83   Wt. (Lbs)  235.6 236  BMI  40.44 kg/m2 40.51 kg/m2      Physical Exam Constitutional:      Appearance: She is well-developed.   Cardiovascular:     Rate and Rhythm: Normal rate.     Heart sounds: Normal heart sounds. No murmur heard. Pulmonary:     Effort: Pulmonary effort is normal.     Breath sounds: Normal breath sounds. No wheezing or rales.  Chest:     Chest wall: No tenderness.  Abdominal:     General: Bowel sounds are normal. There is no distension.     Palpations: Abdomen is soft. There is no mass.     Tenderness: There is no abdominal tenderness.   Musculoskeletal:     Right lower leg: No edema.     Left lower leg: No edema.     Comments: Slight tenderness on range of motion of the left knee Right knee is normal   Neurological:     Mental Status: She is alert and oriented to person, place, and time.   Psychiatric:        Mood and Affect: Mood normal.        Latest Ref Rng & Units 03/19/2023    7:43 AM 02/07/2023    9:53 AM 08/08/2022   10:28 AM  CMP  Glucose 70 - 99 mg/dL 885  897  98   BUN 8 - 23 mg/dL 8  10  15    Creatinine 0.44 - 1.00 mg/dL 9.41  9.35  9.35   Sodium 135 - 145 mmol/L 137  140  143   Potassium 3.5 - 5.1 mmol/L 3.2  3.8  3.5   Chloride 98 - 111 mmol/L 104  102  106   CO2 22 - 32 mmol/L 23  24  23    Calcium  8.9 - 10.3 mg/dL 9.0  9.3  9.4   Total Protein 6.5 - 8.1 g/dL 7.7  7.6  7.7   Total Bilirubin <1.2 mg/dL 0.9   0.8  0.8   Alkaline Phos 38 - 126 U/L 64  83  87   AST 15 - 41 U/L 16  16  17    ALT 0 - 44 U/L 13  11  12      Lipid Panel     Component Value Date/Time   CHOL 213 (H) 02/07/2023 0953   TRIG 91 02/07/2023 0953   HDL 62 02/07/2023 0953   CHOLHDL 3.0 04/27/2022 1102   CHOLHDL 3.4 05/20/2015 0917   VLDL 13 05/20/2015 0917   LDLCALC 135 (H) 02/07/2023 0953    CBC    Component Value Date/Time   WBC 6.5 03/19/2023 0743   RBC 4.53 03/19/2023 0743   HGB 12.2 03/19/2023 0743   HGB 12.0 01/04/2017 1501   HCT 39.5 03/19/2023 0743   HCT 38.1 01/04/2017 1501   PLT 168 03/19/2023 0743   PLT 194 01/04/2017 1501   MCV 87.2 03/19/2023 0743   MCV 83 01/04/2017 1501   MCH 26.9 03/19/2023 0743   MCHC 30.9 03/19/2023 0743   RDW 13.8 03/19/2023 0743   RDW 14.2 01/04/2017 1501   LYMPHSABS 1.8 03/19/2023 0743   LYMPHSABS 2.1 01/04/2017 1501   MONOABS 0.5 03/19/2023 0743   EOSABS 0.2 03/19/2023 0743   EOSABS 0.3 01/04/2017 1501   BASOSABS 0.1 03/19/2023 0743   BASOSABS 0.1 01/04/2017 1501    Lab Results  Component Value Date   HGBA1C 5.2  02/07/2023      1. Hypokalemia (Primary) Potassium was 3.2 Will check level again today  2. Essential hypertension Uncontrolled due to running out of amlodipine  which I have refilled Continue Coreg  Will check blood pressure again in 1 month Counseled on blood pressure goal of less than 130/80, low-sodium, DASH diet, medication compliance, 150 minutes of moderate intensity exercise per week. Discussed medication compliance, adverse effects. - amLODipine  (NORVASC ) 10 MG tablet; Take 1 tablet (10 mg total) by mouth daily.  Dispense: 90 tablet; Refill: 3  3. Vertigo Stable - meclizine  (ANTIVERT ) 25 MG tablet; Take 1 tablet (25 mg total) by mouth 2 (two) times daily as needed for dizziness.  Dispense: 180 tablet; Refill: 3  4. Pure hypercholesterolemia Previously uncontrolled Unable to tolerate daily statin dose but is able to take it a couple of  times a week Low-cholesterol diet - rosuvastatin  (CRESTOR ) 5 MG tablet; Take 1 tablet (5 mg total) by mouth daily.  Dispense: 90 tablet; Refill: 1 - LP+Non-HDL Cholesterol - CMP14+EGFR  5. Overactive bladder Uncontrolled due to running out of Vesicare  which have refilled - solifenacin  (VESICARE ) 5 MG tablet; Take 1 tablet (5 mg total) by mouth daily.  Dispense: 90 tablet; Refill: 3  6. Statin myopathy We have discussed option of PCSK9 inhibitor which she is not open to and would rather stay on Crestor  2-3 times a week  7. Osteoarthritis of left knee, unspecified osteoarthritis type He declined surgery Will switch from naproxen  to meloxicam  Weight loss will be beneficial - meloxicam  (MOBIC ) 7.5 MG tablet; Take 1 tablet (7.5 mg total) by mouth daily.  Dispense: 30 tablet; Refill: 1  8. Other insomnia She has tried OTC sleep aids Sleep hygiene Given underlying history of bipolar disorder, Seroquel will be an option but that would cause weight loss which is not desired in this case. - hydrOXYzine (ATARAX) 25 MG tablet; Take 1 tablet (25 mg total) by mouth at bedtime as needed.  Dispense: 90 tablet; Refill: 1   Meds ordered this encounter  Medications   amLODipine  (NORVASC ) 10 MG tablet    Sig: Take 1 tablet (10 mg total) by mouth daily.    Dispense:  90 tablet    Refill:  3   meclizine  (ANTIVERT ) 25 MG tablet    Sig: Take 1 tablet (25 mg total) by mouth 2 (two) times daily as needed for dizziness.    Dispense:  180 tablet    Refill:  3   rosuvastatin  (CRESTOR ) 5 MG tablet    Sig: Take 1 tablet (5 mg total) by mouth daily.    Dispense:  90 tablet    Refill:  1   solifenacin  (VESICARE ) 5 MG tablet    Sig: Take 1 tablet (5 mg total) by mouth daily.    Dispense:  90 tablet    Refill:  3   hydrOXYzine (ATARAX) 25 MG tablet    Sig: Take 1 tablet (25 mg total) by mouth at bedtime as needed.    Dispense:  90 tablet    Refill:  1   meloxicam  (MOBIC ) 7.5 MG tablet    Sig: Take 1  tablet (7.5 mg total) by mouth daily.    Dispense:  30 tablet    Refill:  1    Follow-up: Return in about 1 month (around 12/08/2023) for Blood Pressure follow-up with PCP.       Corrina Sabin, MD, FAAFP. [facility] [place], [place] [phone]   11/07/2023, 12:53 PM

## 2023-11-08 ENCOUNTER — Ambulatory Visit: Payer: Self-pay | Admitting: Family Medicine

## 2023-11-08 LAB — LP+NON-HDL CHOLESTEROL
Cholesterol, Total: 198 mg/dL (ref 100–199)
HDL: 65 mg/dL (ref >39)
LDL Chol Calc (NIH): 121 mg/dL — ABNORMAL HIGH (ref 0–99)
Total Non-HDL-Chol (LDL+VLDL): 133 mg/dL — ABNORMAL HIGH (ref 0–129)
Triglycerides: 68 mg/dL (ref 0–149)
VLDL Cholesterol Cal: 12 mg/dL (ref 5–40)

## 2023-11-08 LAB — CMP14+EGFR
ALT: 12 IU/L (ref 0–32)
AST: 19 IU/L (ref 0–40)
Albumin: 4.1 g/dL (ref 3.9–4.9)
Alkaline Phosphatase: 84 IU/L (ref 44–121)
BUN/Creatinine Ratio: 16 (ref 12–28)
BUN: 11 mg/dL (ref 8–27)
Bilirubin Total: 0.7 mg/dL (ref 0.0–1.2)
CO2: 21 mmol/L (ref 20–29)
Calcium: 9.1 mg/dL (ref 8.7–10.3)
Chloride: 107 mmol/L — ABNORMAL HIGH (ref 96–106)
Creatinine, Ser: 0.69 mg/dL (ref 0.57–1.00)
Globulin, Total: 3.1 g/dL (ref 1.5–4.5)
Glucose: 94 mg/dL (ref 70–99)
Potassium: 3.5 mmol/L (ref 3.5–5.2)
Sodium: 143 mmol/L (ref 134–144)
Total Protein: 7.2 g/dL (ref 6.0–8.5)
eGFR: 93 mL/min/{1.73_m2} (ref >59)

## 2023-11-20 DIAGNOSIS — Z0100 Encounter for examination of eyes and vision without abnormal findings: Secondary | ICD-10-CM | POA: Diagnosis not present

## 2023-12-15 ENCOUNTER — Telehealth: Payer: Self-pay | Admitting: Family Medicine

## 2023-12-15 NOTE — Telephone Encounter (Signed)
 Pt confirmed appt 8/8

## 2023-12-18 ENCOUNTER — Encounter: Payer: Self-pay | Admitting: Family Medicine

## 2023-12-18 ENCOUNTER — Other Ambulatory Visit: Payer: Self-pay

## 2023-12-18 ENCOUNTER — Ambulatory Visit: Attending: Family Medicine | Admitting: Family Medicine

## 2023-12-18 VITALS — BP 134/75 | HR 68 | Ht 64.0 in | Wt 237.2 lb

## 2023-12-18 DIAGNOSIS — I10 Essential (primary) hypertension: Secondary | ICD-10-CM | POA: Diagnosis not present

## 2023-12-18 DIAGNOSIS — Z9049 Acquired absence of other specified parts of digestive tract: Secondary | ICD-10-CM

## 2023-12-18 DIAGNOSIS — K2900 Acute gastritis without bleeding: Secondary | ICD-10-CM | POA: Diagnosis not present

## 2023-12-18 MED ORDER — OMEPRAZOLE 40 MG PO CPDR
40.0000 mg | DELAYED_RELEASE_CAPSULE | Freq: Every day | ORAL | 3 refills | Status: AC
  Filled 2023-12-18: qty 30, 30d supply, fill #0

## 2023-12-18 MED ORDER — COLESTIPOL HCL 1 G PO TABS
2.0000 g | ORAL_TABLET | Freq: Every day | ORAL | 1 refills | Status: AC
  Filled 2023-12-18: qty 60, 30d supply, fill #0
  Filled 2024-03-26: qty 60, 30d supply, fill #1

## 2023-12-18 NOTE — Progress Notes (Signed)
 Subjective:  Patient ID: Aloma ONEIDA Leaven, female    DOB: 12/12/1950  Age: 73 y.o. MRN: [id number]  CC: Hypertension (Discuss Meloxicam )     Discussed the use of AI scribe software for clinical note transcription with the patient, who gave verbal consent to proceed.  History of Present Illness RISHA BARRETTA is a 73 year old female with a history of hypertension, hyperlipidemia, obesity, chronic left knee pain, bipolar disorder who presents for blood pressure follow-up and with gastrointestinal issues related to meloxicam  use.  Her blood pressure was elevated at her last office visit as she had been out of one of her medications but blood pressure is normal today.  She experiences severe stomach pain and diarrhea after taking meloxicam , which began after starting the medication. These symptoms led her to discontinue meloxicam  two weeks ago.  This is often taken without food due to her work schedule. She works late hours, sleeps until the afternoon, and frequently skips meals, complicating her ability to take meloxicam  with food. She experienced a past episode of blood in her stool associated with meloxicam , which resolved after stopping the medication. She also experiences cramping and sweating episodes linked to meloxicam . She denies current blood in stool, nausea, and vomiting.She is on meloxicam  for osteoarthritis.  She has chronic diarrhea managed with daily Imodium following gallbladder removal. She is concerned about Imodium's effectiveness and seeks alternative treatments.       Past Medical History:  Diagnosis Date   Arthritis    Hypertension     Past Surgical History:  Procedure Laterality Date   CHOLECYSTECTOMY     09/2021   MANDIBLE SURGERY Left 05/10/1979   impacted teeth   SHOULDER ARTHROSCOPY Left 05/10/1999   for cyst    Family History  Problem Relation Age of Onset   Arthritis Mother    Heart disease Mother    Heart disease Father     Social History    Socioeconomic History   Marital status: Single    Spouse name: Not on file   Number of children: Not on file   Years of education: Not on file   Highest education level: Not on file  Occupational History   Not on file  Tobacco Use   Smoking status: Former   Smokeless tobacco: Never  Vaping Use   Vaping status: Never Used  Substance and Sexual Activity   Alcohol use: No   Drug use: No   Sexual activity: Not on file  Other Topics Concern   Not on file  Social History Narrative   Not on file   Social Drivers of Health   Financial Resource Strain: Low Risk  (09/12/2023)   Overall Financial Resource Strain (CARDIA)    Difficulty of Paying Living Expenses: Not hard at all  Food Insecurity: No Food Insecurity (09/12/2023)   Hunger Vital Sign    Worried About Running Out of Food in the Last Year: Never true    Ran Out of Food in the Last Year: Never true  Transportation Needs: No Transportation Needs (09/12/2023)   PRAPARE - Administrator, Civil Service (Medical): No    Lack of Transportation (Non-Medical): No  Physical Activity: Inactive (09/12/2023)   Exercise Vital Sign    Days of Exercise per Week: 0 days    Minutes of Exercise per Session: 0 min  Stress: No Stress Concern Present (09/12/2023)   [employer] of Occupational Health - Occupational Stress Questionnaire    Feeling of Stress :  Only a little  Social Connections: Moderately Integrated (09/12/2023)   Social Connection and Isolation Panel    Frequency of Communication with Friends and Family: More than three times a week    Frequency of Social Gatherings with Friends and Family: More than three times a week    Attends Religious Services: More than 4 times per year    Active Member of [employer] or Organizations: Yes    Attends Banker Meetings: More than 4 times per year    Marital Status: Widowed    Allergies  Allergen Reactions   Tramadol  Itching   Vicks Nyquil Cold & Flu Night  [Dm-Apap-Cpm]     itch    Outpatient Medications Prior to Visit  Medication Sig Dispense Refill   amLODipine  (NORVASC ) 10 MG tablet Take 1 tablet (10 mg total) by mouth daily. 90 tablet 3   buPROPion (WELLBUTRIN) 100 MG tablet Take 100 mg by mouth daily.     carvedilol  (COREG ) 6.25 MG tablet TAKE 1 TABLET TWICE DAILY WITH MEALS 180 tablet 3   cetirizine  (ZYRTEC ) 10 MG tablet Take 1 tablet (10 mg total) by mouth daily. 30 tablet 1   diclofenac  Sodium (VOLTAREN  ARTHRITIS PAIN) 1 % GEL Apply 2 g topically 4 (four) times daily. 100 g 2   dicyclomine  (BENTYL ) 20 MG tablet Take 1 tablet (20 mg total) by mouth 3 (three) times daily as needed for spasms. 20 tablet 0   FLUoxetine  (PROZAC ) 20 MG capsule TAKE 3 CAPSULES EVERY DAY 270 capsule 3   hydrOXYzine  (ATARAX ) 25 MG tablet Take 1 tablet (25 mg total) by mouth at bedtime as needed. 90 tablet 1   meclizine  (ANTIVERT ) 25 MG tablet Take 1 tablet (25 mg total) by mouth 2 (two) times daily as needed for dizziness. 180 tablet 3   meloxicam  (MOBIC ) 7.5 MG tablet Take 1 tablet (7.5 mg total) by mouth daily. 30 tablet 1   rosuvastatin  (CRESTOR ) 5 MG tablet Take 1 tablet (5 mg total) by mouth daily. 90 tablet 1   solifenacin  (VESICARE ) 5 MG tablet Take 1 tablet (5 mg total) by mouth daily. 90 tablet 3   No facility-administered medications prior to visit.     ROS Review of Systems  Constitutional:  Negative for activity change and appetite change.  HENT:  Negative for sinus pressure and sore throat.   Respiratory:  Negative for chest tightness, shortness of breath and wheezing.   Cardiovascular:  Negative for chest pain and palpitations.  Gastrointestinal:        See HPI  Genitourinary: Negative.   Musculoskeletal: Negative.   Psychiatric/Behavioral:  Negative for behavioral problems and dysphoric mood.     Objective:  BP 134/75   Pulse 68   Ht 5' 4 (1.626 m)   Wt 237 lb 3.2 oz (107.6 kg)   SpO2 96%   BMI 40.72 kg/m      12/18/2023     4:25 PM 11/07/2023   11:29 AM 11/07/2023   10:50 AM  BP/Weight  Systolic BP 134 180 178  Diastolic BP 75 89 83  Wt. (Lbs) 237.2  235.6  BMI 40.72 kg/m2  40.44 kg/m2      Physical Exam Constitutional:      Appearance: She is well-developed.  Cardiovascular:     Rate and Rhythm: Normal rate.     Heart sounds: Normal heart sounds. No murmur heard. Pulmonary:     Effort: Pulmonary effort is normal.     Breath sounds: Normal breath sounds.  No wheezing or rales.  Chest:     Chest wall: No tenderness.  Abdominal:     General: Bowel sounds are normal. There is no distension.     Palpations: Abdomen is soft. There is no mass.     Tenderness: There is no abdominal tenderness.  Musculoskeletal:        General: Normal range of motion.     Right lower leg: No edema.     Left lower leg: No edema.  Neurological:     Mental Status: She is alert and oriented to person, place, and time.  Psychiatric:        Mood and Affect: Mood normal.        Latest Ref Rng & Units 11/07/2023   11:38 AM 03/19/2023    7:43 AM 02/07/2023    9:53 AM  CMP  Glucose 70 - 99 mg/dL 94  885  897   BUN 8 - 27 mg/dL 11  8  10    Creatinine 0.57 - 1.00 mg/dL 9.30  9.41  9.35   Sodium 134 - 144 mmol/L 143  137  140   Potassium 3.5 - 5.2 mmol/L 3.5  3.2  3.8   Chloride 96 - 106 mmol/L 107  104  102   CO2 20 - 29 mmol/L 21  23  24    Calcium  8.7 - 10.3 mg/dL 9.1  9.0  9.3   Total Protein 6.0 - 8.5 g/dL 7.2  7.7  7.6   Total Bilirubin 0.0 - 1.2 mg/dL 0.7  0.9  0.8   Alkaline Phos 44 - 121 IU/L 84  64  83   AST 0 - 40 IU/L 19  16  16    ALT 0 - 32 IU/L 12  13  11      Lipid Panel     Component Value Date/Time   CHOL 198 11/07/2023 1138   TRIG 68 11/07/2023 1138   HDL 65 11/07/2023 1138   CHOLHDL 3.0 04/27/2022 1102   CHOLHDL 3.4 05/20/2015 0917   VLDL 13 05/20/2015 0917   LDLCALC 121 (H) 11/07/2023 1138    CBC    Component Value Date/Time   WBC 6.5 03/19/2023 0743   RBC 4.53 03/19/2023 0743   HGB 12.2  03/19/2023 0743   HGB 12.0 01/04/2017 1501   HCT 39.5 03/19/2023 0743   HCT 38.1 01/04/2017 1501   PLT 168 03/19/2023 0743   PLT 194 01/04/2017 1501   MCV 87.2 03/19/2023 0743   MCV 83 01/04/2017 1501   MCH 26.9 03/19/2023 0743   MCHC 30.9 03/19/2023 0743   RDW 13.8 03/19/2023 0743   RDW 14.2 01/04/2017 1501   LYMPHSABS 1.8 03/19/2023 0743   LYMPHSABS 2.1 01/04/2017 1501   MONOABS 0.5 03/19/2023 0743   EOSABS 0.2 03/19/2023 0743   EOSABS 0.3 01/04/2017 1501   BASOSABS 0.1 03/19/2023 0743   BASOSABS 0.1 01/04/2017 1501    Lab Results  Component Value Date   HGBA1C 5.2 02/07/2023       Assessment & Plan Status post cholecystectomy 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 Current Imodium use is insufficient. - Prescribed colestipol  - Sent prescription to pharmacy   Gastritis Gastritis likely from meloxicam  use without food, causing severe stomach pain. Symptoms improved after discontinuation. - Prescribed omeprazole  to protect stomach lining and treat potential ulcers. - Advised taking meloxicam  with food to prevent gastritis. - Instructed to discontinue meloxicam  and notify provider if symptoms recur despite omeprazole  and food intake.  Essential hypertension Blood pressure has improved and is normal compared to last visit Continue antihypertensive Discussed hypertension management and emphasized medication adherence to prevent complications.         Meds ordered this encounter  Medications   colestipol  (COLESTID ) 1 g tablet    Sig: Take 2 tablets (2 g total) by mouth daily.    Dispense:  180 tablet    Refill:  1   omeprazole  (PRILOSEC) 40 MG capsule    Sig: Take 1 capsule (40 mg total) by mouth daily.    Dispense:  30 capsule    Refill:  3    Follow-up: Return in about 6 months (around 06/19/2024) for Chronic medical conditions.       Corrina Sabin, MD, FAAFP. [facility]  [place], [place] [phone]   12/18/2023, 4:54 PM

## 2023-12-18 NOTE — Patient Instructions (Signed)
 VISIT SUMMARY:  During your visit, we discussed your gastrointestinal issues related to meloxicam  use, chronic diarrhea after gallbladder removal, and your osteoarthritis pain management. We also reviewed your hypertension and depression management.  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 We have prescribed colestipol  to help solidify your stools and reduce diarrhea. Your prescription has been sent to the pharmacy and we have checked for insurance coverage.  -GASTRITIS DUE TO MELOXICAM : Gastritis is inflammation of the stomach lining, which can be caused by taking meloxicam  without food. We have prescribed omeprazole  to protect your stomach lining and treat potential ulcers. Please take meloxicam  with food to prevent gastritis. If your symptoms return despite taking omeprazole  and eating food, discontinue meloxicam  and notify us  immediately.  -OSTEOARTHRITIS OF LEFT KNEE: Osteoarthritis is a condition that causes pain and stiffness in the joints. You have been using meloxicam  for pain relief, but it has caused gastrointestinal issues. Please take meloxicam  with food to reduce these side effects. If you continue to have problems with meloxicam , we will consider alternative pain management options.  -ESSENTIAL HYPERTENSION: Hypertension is high blood pressure. We discussed the importance of adhering to your medication regimen to prevent complications.  -DEPRESSION: Depression is a mood disorder that you are managing with fluoxetine . Continue taking your medication as prescribed.  INSTRUCTIONS:  Please follow up with us  if you experience any recurring symptoms or have any concerns about your medications. Make sure to take meloxicam  with food and monitor your symptoms closely. If you have any issues with your new prescriptions, contact us  immediately.

## 2023-12-20 ENCOUNTER — Other Ambulatory Visit: Payer: Self-pay

## 2023-12-21 ENCOUNTER — Other Ambulatory Visit: Payer: Self-pay

## 2024-03-02 DIAGNOSIS — M1990 Unspecified osteoarthritis, unspecified site: Secondary | ICD-10-CM | POA: Diagnosis not present

## 2024-03-02 DIAGNOSIS — F419 Anxiety disorder, unspecified: Secondary | ICD-10-CM | POA: Diagnosis not present

## 2024-03-02 DIAGNOSIS — K909 Intestinal malabsorption, unspecified: Secondary | ICD-10-CM | POA: Diagnosis not present

## 2024-03-02 DIAGNOSIS — E785 Hyperlipidemia, unspecified: Secondary | ICD-10-CM | POA: Diagnosis not present

## 2024-03-02 DIAGNOSIS — K760 Fatty (change of) liver, not elsewhere classified: Secondary | ICD-10-CM | POA: Diagnosis not present

## 2024-03-02 DIAGNOSIS — Z5941 Food insecurity: Secondary | ICD-10-CM | POA: Diagnosis not present

## 2024-03-02 DIAGNOSIS — N3941 Urge incontinence: Secondary | ICD-10-CM | POA: Diagnosis not present

## 2024-03-02 DIAGNOSIS — F319 Bipolar disorder, unspecified: Secondary | ICD-10-CM | POA: Diagnosis not present

## 2024-03-02 DIAGNOSIS — Z6841 Body Mass Index (BMI) 40.0 and over, adult: Secondary | ICD-10-CM | POA: Diagnosis not present

## 2024-03-02 DIAGNOSIS — Z5948 Other specified lack of adequate food: Secondary | ICD-10-CM | POA: Diagnosis not present

## 2024-03-02 DIAGNOSIS — I2510 Atherosclerotic heart disease of native coronary artery without angina pectoris: Secondary | ICD-10-CM | POA: Diagnosis not present

## 2024-03-02 DIAGNOSIS — H269 Unspecified cataract: Secondary | ICD-10-CM | POA: Diagnosis not present

## 2024-03-02 DIAGNOSIS — K219 Gastro-esophageal reflux disease without esophagitis: Secondary | ICD-10-CM | POA: Diagnosis not present

## 2024-03-02 DIAGNOSIS — I10 Essential (primary) hypertension: Secondary | ICD-10-CM | POA: Diagnosis not present

## 2024-03-26 ENCOUNTER — Other Ambulatory Visit: Payer: Self-pay

## 2024-05-15 ENCOUNTER — Other Ambulatory Visit: Payer: Self-pay | Admitting: Family Medicine

## 2024-05-15 DIAGNOSIS — F3176 Bipolar disorder, in full remission, most recent episode depressed: Secondary | ICD-10-CM

## 2024-05-15 DIAGNOSIS — I10 Essential (primary) hypertension: Secondary | ICD-10-CM

## 2024-06-11 ENCOUNTER — Encounter: Payer: Self-pay | Admitting: Family

## 2024-06-11 ENCOUNTER — Ambulatory Visit: Payer: Self-pay

## 2024-06-11 ENCOUNTER — Other Ambulatory Visit: Payer: Self-pay | Admitting: Family

## 2024-06-11 ENCOUNTER — Other Ambulatory Visit: Payer: Self-pay

## 2024-06-11 ENCOUNTER — Ambulatory Visit: Admitting: Family

## 2024-06-11 VITALS — BP 170/82 | HR 61 | Ht 64.0 in | Wt 241.0 lb

## 2024-06-11 DIAGNOSIS — I10 Essential (primary) hypertension: Secondary | ICD-10-CM

## 2024-06-11 DIAGNOSIS — Z1211 Encounter for screening for malignant neoplasm of colon: Secondary | ICD-10-CM

## 2024-06-11 DIAGNOSIS — B3731 Acute candidiasis of vulva and vagina: Secondary | ICD-10-CM

## 2024-06-11 DIAGNOSIS — R197 Diarrhea, unspecified: Secondary | ICD-10-CM

## 2024-06-11 DIAGNOSIS — R399 Unspecified symptoms and signs involving the genitourinary system: Secondary | ICD-10-CM

## 2024-06-11 MED ORDER — FLUCONAZOLE 150 MG PO TABS
150.0000 mg | ORAL_TABLET | ORAL | 0 refills | Status: AC
  Filled 2024-06-11: qty 3, 9d supply, fill #0

## 2024-06-11 MED ORDER — NITROFURANTOIN MONOHYD MACRO 100 MG PO CAPS
100.0000 mg | ORAL_CAPSULE | Freq: Two times a day (BID) | ORAL | 0 refills | Status: AC
  Filled 2024-06-11: qty 14, 7d supply, fill #0

## 2024-06-11 MED ORDER — DIPHENOXYLATE-ATROPINE 2.5-0.025 MG PO TABS
1.0000 | ORAL_TABLET | Freq: Four times a day (QID) | ORAL | 0 refills | Status: AC | PRN
  Filled 2024-06-11: qty 30, 8d supply, fill #0

## 2024-06-11 NOTE — Telephone Encounter (Signed)
 FYI Only or Action Required?: FYI only for provider: appointment scheduled on 06/11/24.  Stephanie Juarez was last seen in primary care on 12/18/2023 by Newlin, Enobong, MD.  Called Nurse Triage reporting Diarrhea.  Symptoms began several weeks ago.  Interventions attempted: OTC medications: Imodium and Prescription medications: Colestipol .  Symptoms are: gradually improving.  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 She states that she can't eat anything without it going right through her stomach and is experiencing more urgency. She states that she was taking Colestipol  that was ordered by PCP and it did help, but she didn't have any refills on the medication and she notes that it was a little expensive for her. She also reports using imodium, but it does not seem to be helping. Office visit advised-scheduled for appt at [facility].   1. DIARRHEA SEVERITY: How bad is the diarrhea? How many more stools have you had in the past 24 hours than normal?      2-3/day  2. ONSET: When did the diarrhea begin?      Chronic diarrhea, recently worsened  3. STOOL DESCRIPTION:  How loose or watery is the diarrhea? What is the stool color? Is there any blood or mucous in the stool?     Loose, no blood  4. VOMITING: Are you also vomiting? If Yes, ask: How many times in the past 24 hours?      No  5. ABDOMEN PAIN: Are you having any abdomen pain? If Yes, ask: What does it feel like? (e.g., crampy, dull, intermittent, constant)      Yes, cramping  intermittent   6. ABDOMEN PAIN SEVERITY: If present, ask: How bad is the pain?  (e.g., Scale 1-10; mild, moderate, or severe)     Unknown  7. ORAL INTAKE: If vomiting, Have you been able to drink liquids? How much liquids have you had in the past 24 hours?     NA  8. HYDRATION: Any signs of dehydration? (e.g., dry mouth [not just dry lips], too weak to stand, dizziness, new weight loss) When did you last urinate?     Denies any signs of dehydration  9. EXPOSURE: Have you traveled to a foreign country recently? Have you been exposed to anyone with diarrhea? Could you have eaten any food that was spoiled?     No  10. ANTIBIOTIC USE: Are you taking antibiotics now or have you taken antibiotics in the past 2 months?       No  11. OTHER SYMPTOMS: Do you have any other symptoms? (e.g., fever, blood in stool)       No  12. PREGNANCY: Is there any chance you are pregnant? When was your last menstrual period?       NA  Protocols used: [facility]  Copied from CRM [id number]. Topic: Clinical - Medication Question >> Jun 11, 2024 12:14 PM Stephanie Juarez wrote: Reason for CRM: Stephanie Juarez states she is still having trouble with her stomach being upset after having her gallbladder removed 2 years ago and would like to know if there is something stronger  that PCP can recommend for her outside the medication she previously prescribed for this which was colestipol  (COLESTID ) 1 g tablet. She has seen PCP for this already, but the medication is not helping her. If so, the following is the preferred pharmacy:  [facility] [address], [address], [place] [address] Phone: [phone]  Fax: [phone]   Stephanie Juarez callback [phone] (home)

## 2024-06-11 NOTE — Telephone Encounter (Signed)
 Noted.

## 2024-06-12 ENCOUNTER — Other Ambulatory Visit: Payer: Self-pay

## 2024-06-12 ENCOUNTER — Other Ambulatory Visit: Payer: Self-pay | Admitting: Family Medicine

## 2024-06-12 DIAGNOSIS — M1712 Unilateral primary osteoarthritis, left knee: Secondary | ICD-10-CM

## 2024-06-12 MED ORDER — MELOXICAM 7.5 MG PO TABS
7.5000 mg | ORAL_TABLET | Freq: Every day | ORAL | 0 refills | Status: AC
  Filled 2024-06-12: qty 30, 30d supply, fill #0

## 2024-09-17 ENCOUNTER — Ambulatory Visit
